# Patient Record
Sex: Male | Born: 1950 | Race: White | Hispanic: No | Marital: Married | Smoking: Never smoker
Health system: Southern US, Community
[De-identification: ages and names within clinical notes are randomized; demographics above are authoritative.]

## PROBLEM LIST (undated history)

## (undated) DIAGNOSIS — R972 Elevated prostate specific antigen [PSA]: Secondary | ICD-10-CM

## (undated) DIAGNOSIS — R7989 Other specified abnormal findings of blood chemistry: Secondary | ICD-10-CM

## (undated) DIAGNOSIS — F329 Major depressive disorder, single episode, unspecified: Secondary | ICD-10-CM

## (undated) DIAGNOSIS — K831 Obstruction of bile duct: Secondary | ICD-10-CM

## (undated) DIAGNOSIS — K589 Irritable bowel syndrome without diarrhea: Secondary | ICD-10-CM

## (undated) DIAGNOSIS — K859 Acute pancreatitis without necrosis or infection, unspecified: Secondary | ICD-10-CM

## (undated) DIAGNOSIS — R945 Abnormal results of liver function studies: Secondary | ICD-10-CM

## (undated) DIAGNOSIS — F32A Depression, unspecified: Secondary | ICD-10-CM

## (undated) DIAGNOSIS — E786 Lipoprotein deficiency: Secondary | ICD-10-CM

## (undated) DIAGNOSIS — I340 Nonrheumatic mitral (valve) insufficiency: Secondary | ICD-10-CM

## (undated) DIAGNOSIS — F419 Anxiety disorder, unspecified: Secondary | ICD-10-CM

## (undated) DIAGNOSIS — K281 Acute gastrojejunal ulcer with perforation: Secondary | ICD-10-CM

## (undated) DIAGNOSIS — I480 Paroxysmal atrial fibrillation: Secondary | ICD-10-CM

## (undated) DIAGNOSIS — G4730 Sleep apnea, unspecified: Secondary | ICD-10-CM

## (undated) DIAGNOSIS — K2900 Acute gastritis without bleeding: Secondary | ICD-10-CM

## (undated) DIAGNOSIS — K8020 Calculus of gallbladder without cholecystitis without obstruction: Secondary | ICD-10-CM

## (undated) HISTORY — DX: Irritable bowel syndrome, unspecified: K58.9

## (undated) HISTORY — DX: Other specified abnormal findings of blood chemistry: R79.89

## (undated) HISTORY — DX: Calculus of gallbladder without cholecystitis without obstruction: K80.20

## (undated) HISTORY — DX: Sleep apnea, unspecified: G47.30

## (undated) HISTORY — DX: Acute pancreatitis without necrosis or infection, unspecified: K85.90

## (undated) HISTORY — DX: Acute gastrojejunal ulcer with perforation: K28.1

## (undated) HISTORY — DX: Obstruction of bile duct: K83.1

## (undated) HISTORY — DX: Acute gastritis without bleeding: K29.00

## (undated) HISTORY — DX: Anxiety disorder, unspecified: F41.9

## (undated) HISTORY — DX: Major depressive disorder, single episode, unspecified: F32.9

## (undated) HISTORY — DX: Paroxysmal atrial fibrillation: I48.0

## (undated) HISTORY — DX: Elevated prostate specific antigen (PSA): R97.20

## (undated) HISTORY — DX: Nonrheumatic mitral (valve) insufficiency: I34.0

## (undated) HISTORY — DX: Lipoprotein deficiency: E78.6

## (undated) HISTORY — DX: Abnormal results of liver function studies: R94.5

## (undated) HISTORY — DX: Depression, unspecified: F32.A

---

## 1997-12-08 ENCOUNTER — Emergency Department (HOSPITAL_COMMUNITY): Admission: RE | Admit: 1997-12-08 | Discharge: 1997-12-08 | Payer: Self-pay

## 1998-10-02 HISTORY — PX: WHIPPLE PROCEDURE: SHX2667

## 1999-03-24 ENCOUNTER — Encounter: Payer: Self-pay | Admitting: Pulmonary Disease

## 1999-03-24 ENCOUNTER — Ambulatory Visit (HOSPITAL_COMMUNITY): Admission: RE | Admit: 1999-03-24 | Discharge: 1999-03-24 | Payer: Self-pay | Admitting: Pulmonary Disease

## 1999-04-04 ENCOUNTER — Encounter: Payer: Self-pay | Admitting: Pulmonary Disease

## 1999-04-04 ENCOUNTER — Ambulatory Visit (HOSPITAL_COMMUNITY): Admission: RE | Admit: 1999-04-04 | Discharge: 1999-04-04 | Payer: Self-pay | Admitting: Pulmonary Disease

## 1999-05-15 ENCOUNTER — Emergency Department (HOSPITAL_COMMUNITY): Admission: EM | Admit: 1999-05-15 | Discharge: 1999-05-16 | Payer: Self-pay | Admitting: Emergency Medicine

## 1999-05-17 ENCOUNTER — Encounter: Payer: Self-pay | Admitting: Pulmonary Disease

## 1999-05-17 ENCOUNTER — Inpatient Hospital Stay (HOSPITAL_COMMUNITY): Admission: AD | Admit: 1999-05-17 | Discharge: 1999-05-20 | Payer: Self-pay | Admitting: Pulmonary Disease

## 1999-05-18 ENCOUNTER — Encounter: Payer: Self-pay | Admitting: Pulmonary Disease

## 1999-05-20 ENCOUNTER — Encounter: Payer: Self-pay | Admitting: General Surgery

## 1999-11-27 ENCOUNTER — Emergency Department (HOSPITAL_COMMUNITY): Admission: EM | Admit: 1999-11-27 | Discharge: 1999-11-27 | Payer: Self-pay | Admitting: Emergency Medicine

## 1999-11-28 ENCOUNTER — Encounter: Payer: Self-pay | Admitting: Orthopedic Surgery

## 1999-11-28 ENCOUNTER — Encounter: Admission: RE | Admit: 1999-11-28 | Discharge: 1999-11-28 | Payer: Self-pay | Admitting: Orthopedic Surgery

## 1999-11-28 ENCOUNTER — Encounter: Payer: Self-pay | Admitting: Emergency Medicine

## 1999-12-08 ENCOUNTER — Encounter: Payer: Self-pay | Admitting: Orthopedic Surgery

## 1999-12-08 ENCOUNTER — Encounter: Admission: RE | Admit: 1999-12-08 | Discharge: 1999-12-08 | Payer: Self-pay | Admitting: Orthopedic Surgery

## 1999-12-26 ENCOUNTER — Encounter: Admission: RE | Admit: 1999-12-26 | Discharge: 1999-12-26 | Payer: Self-pay | Admitting: Orthopedic Surgery

## 1999-12-26 ENCOUNTER — Encounter: Payer: Self-pay | Admitting: Orthopedic Surgery

## 2000-08-06 ENCOUNTER — Encounter (INDEPENDENT_AMBULATORY_CARE_PROVIDER_SITE_OTHER): Payer: Self-pay | Admitting: Specialist

## 2000-08-06 ENCOUNTER — Ambulatory Visit (HOSPITAL_COMMUNITY): Admission: RE | Admit: 2000-08-06 | Discharge: 2000-08-06 | Payer: Self-pay | Admitting: Gastroenterology

## 2000-09-14 ENCOUNTER — Encounter: Payer: Self-pay | Admitting: Gastroenterology

## 2000-09-14 ENCOUNTER — Ambulatory Visit (HOSPITAL_COMMUNITY): Admission: RE | Admit: 2000-09-14 | Discharge: 2000-09-14 | Payer: Self-pay | Admitting: Gastroenterology

## 2000-11-17 ENCOUNTER — Encounter: Payer: Self-pay | Admitting: Pulmonary Disease

## 2000-11-17 ENCOUNTER — Ambulatory Visit (HOSPITAL_COMMUNITY): Admission: RE | Admit: 2000-11-17 | Discharge: 2000-11-17 | Payer: Self-pay | Admitting: Pulmonary Disease

## 2001-06-12 ENCOUNTER — Encounter: Admission: RE | Admit: 2001-06-12 | Discharge: 2001-06-12 | Payer: Self-pay | Admitting: Gastroenterology

## 2001-06-12 ENCOUNTER — Encounter: Payer: Self-pay | Admitting: Gastroenterology

## 2001-06-13 ENCOUNTER — Encounter: Payer: Self-pay | Admitting: Emergency Medicine

## 2001-06-13 ENCOUNTER — Emergency Department (HOSPITAL_COMMUNITY): Admission: EM | Admit: 2001-06-13 | Discharge: 2001-06-13 | Payer: Self-pay | Admitting: Emergency Medicine

## 2002-02-15 ENCOUNTER — Emergency Department: Admission: EM | Admit: 2002-02-15 | Discharge: 2002-02-15 | Payer: Self-pay | Admitting: Emergency Medicine

## 2002-05-15 ENCOUNTER — Emergency Department (HOSPITAL_COMMUNITY): Admission: EM | Admit: 2002-05-15 | Discharge: 2002-05-15 | Payer: Self-pay | Admitting: *Deleted

## 2002-05-15 ENCOUNTER — Encounter: Payer: Self-pay | Admitting: *Deleted

## 2003-08-03 HISTORY — PX: OTHER SURGICAL HISTORY: SHX169

## 2003-09-10 ENCOUNTER — Encounter: Admission: RE | Admit: 2003-09-10 | Discharge: 2003-09-10 | Payer: Self-pay | Admitting: Gastroenterology

## 2003-09-22 ENCOUNTER — Ambulatory Visit (HOSPITAL_COMMUNITY): Admission: RE | Admit: 2003-09-22 | Discharge: 2003-09-22 | Payer: Self-pay | Admitting: Gastroenterology

## 2003-09-22 ENCOUNTER — Encounter (INDEPENDENT_AMBULATORY_CARE_PROVIDER_SITE_OTHER): Payer: Self-pay | Admitting: *Deleted

## 2004-02-19 ENCOUNTER — Emergency Department (HOSPITAL_COMMUNITY): Admission: EM | Admit: 2004-02-19 | Discharge: 2004-02-19 | Payer: Self-pay | Admitting: Emergency Medicine

## 2004-02-20 ENCOUNTER — Emergency Department (HOSPITAL_COMMUNITY): Admission: EM | Admit: 2004-02-20 | Discharge: 2004-02-20 | Payer: Self-pay | Admitting: Emergency Medicine

## 2004-04-01 HISTORY — PX: OTHER SURGICAL HISTORY: SHX169

## 2004-07-22 ENCOUNTER — Encounter: Admission: RE | Admit: 2004-07-22 | Discharge: 2004-09-16 | Payer: Self-pay | Admitting: Pulmonary Disease

## 2005-01-16 ENCOUNTER — Ambulatory Visit: Payer: Self-pay | Admitting: Pulmonary Disease

## 2005-01-16 LAB — CONVERTED CEMR LAB: PSA: 3.6 ng/mL

## 2006-04-16 ENCOUNTER — Ambulatory Visit: Payer: Self-pay | Admitting: Pulmonary Disease

## 2006-06-14 ENCOUNTER — Ambulatory Visit: Payer: Self-pay | Admitting: Pulmonary Disease

## 2006-06-22 ENCOUNTER — Ambulatory Visit: Payer: Self-pay | Admitting: Pulmonary Disease

## 2007-04-18 ENCOUNTER — Ambulatory Visit: Payer: Self-pay | Admitting: Pulmonary Disease

## 2007-06-05 ENCOUNTER — Inpatient Hospital Stay (HOSPITAL_COMMUNITY): Admission: EM | Admit: 2007-06-05 | Discharge: 2007-06-09 | Payer: Self-pay | Admitting: Emergency Medicine

## 2007-06-06 ENCOUNTER — Encounter (INDEPENDENT_AMBULATORY_CARE_PROVIDER_SITE_OTHER): Payer: Self-pay | Admitting: Internal Medicine

## 2007-06-07 ENCOUNTER — Encounter: Payer: Self-pay | Admitting: Pulmonary Disease

## 2007-06-07 DIAGNOSIS — K589 Irritable bowel syndrome without diarrhea: Secondary | ICD-10-CM | POA: Insufficient documentation

## 2007-06-07 DIAGNOSIS — K831 Obstruction of bile duct: Secondary | ICD-10-CM | POA: Insufficient documentation

## 2007-06-07 DIAGNOSIS — K861 Other chronic pancreatitis: Secondary | ICD-10-CM | POA: Insufficient documentation

## 2007-06-07 DIAGNOSIS — R945 Abnormal results of liver function studies: Secondary | ICD-10-CM | POA: Insufficient documentation

## 2007-06-07 DIAGNOSIS — R109 Unspecified abdominal pain: Secondary | ICD-10-CM | POA: Insufficient documentation

## 2007-06-07 DIAGNOSIS — K281 Acute gastrojejunal ulcer with perforation: Secondary | ICD-10-CM | POA: Insufficient documentation

## 2007-06-07 DIAGNOSIS — K2900 Acute gastritis without bleeding: Secondary | ICD-10-CM | POA: Insufficient documentation

## 2007-06-07 DIAGNOSIS — G4730 Sleep apnea, unspecified: Secondary | ICD-10-CM | POA: Insufficient documentation

## 2007-06-11 DIAGNOSIS — I4891 Unspecified atrial fibrillation: Secondary | ICD-10-CM | POA: Insufficient documentation

## 2008-06-03 ENCOUNTER — Encounter: Payer: Self-pay | Admitting: Pulmonary Disease

## 2008-06-17 ENCOUNTER — Encounter: Payer: Self-pay | Admitting: Pulmonary Disease

## 2008-06-22 ENCOUNTER — Other Ambulatory Visit (HOSPITAL_COMMUNITY): Admission: RE | Admit: 2008-06-22 | Discharge: 2008-06-25 | Payer: Self-pay | Admitting: Psychiatry

## 2008-07-20 ENCOUNTER — Ambulatory Visit: Payer: Self-pay | Admitting: Pulmonary Disease

## 2008-07-28 DIAGNOSIS — E786 Lipoprotein deficiency: Secondary | ICD-10-CM | POA: Insufficient documentation

## 2008-07-28 DIAGNOSIS — K5730 Diverticulosis of large intestine without perforation or abscess without bleeding: Secondary | ICD-10-CM | POA: Insufficient documentation

## 2008-07-29 ENCOUNTER — Ambulatory Visit: Payer: Self-pay | Admitting: Pulmonary Disease

## 2008-07-31 LAB — CONVERTED CEMR LAB
ALT: 59 units/L — ABNORMAL HIGH (ref 0–53)
AST: 30 units/L (ref 0–37)
Albumin: 4.4 g/dL (ref 3.5–5.2)
Alkaline Phosphatase: 86 units/L (ref 39–117)
BUN: 11 mg/dL (ref 6–23)
Basophils Absolute: 0 10*3/uL (ref 0.0–0.1)
Basophils Relative: 0.4 % (ref 0.0–3.0)
Bilirubin Urine: NEGATIVE
Bilirubin, Direct: 0.1 mg/dL (ref 0.0–0.3)
CO2: 30 meq/L (ref 19–32)
Calcium: 8.8 mg/dL (ref 8.4–10.5)
Chloride: 107 meq/L (ref 96–112)
Cholesterol: 165 mg/dL (ref 0–200)
Creatinine, Ser: 0.8 mg/dL (ref 0.4–1.5)
Eosinophils Absolute: 0.2 10*3/uL (ref 0.0–0.7)
Eosinophils Relative: 2 % (ref 0.0–5.0)
GFR calc Af Amer: 128 mL/min
GFR calc non Af Amer: 106 mL/min
Glucose, Bld: 126 mg/dL — ABNORMAL HIGH (ref 70–99)
HCT: 44.7 % (ref 39.0–52.0)
HDL: 36.6 mg/dL — ABNORMAL LOW (ref >39.0)
Hemoglobin, Urine: NEGATIVE
Hemoglobin: 15.6 g/dL (ref 13.0–17.0)
Ketones, ur: NEGATIVE mg/dL
LDL Cholesterol: 95 mg/dL (ref 0–99)
Leukocytes, UA: NEGATIVE
Lymphocytes Relative: 20 % (ref 12.0–46.0)
MCHC: 34.9 g/dL (ref 30.0–36.0)
MCV: 84.7 fL (ref 78.0–100.0)
Monocytes Absolute: 0.5 10*3/uL (ref 0.1–1.0)
Monocytes Relative: 5.3 % (ref 3.0–12.0)
Neutro Abs: 6.5 10*3/uL (ref 1.4–7.7)
Neutrophils Relative %: 72.3 % (ref 43.0–77.0)
Nitrite: NEGATIVE
PSA: 4.38 ng/mL — ABNORMAL HIGH (ref 0.10–4.00)
Platelets: 203 10*3/uL (ref 150–400)
Potassium: 4 meq/L (ref 3.5–5.1)
RBC: 5.27 M/uL (ref 4.22–5.81)
RDW: 13.4 % (ref 11.5–14.6)
Sodium: 143 meq/L (ref 135–145)
Specific Gravity, Urine: >=1.03 (ref 1.000–1.03)
TSH: 1.54 microintl units/mL (ref 0.35–5.50)
Total Bilirubin: 0.7 mg/dL (ref 0.3–1.2)
Total CHOL/HDL Ratio: 4.5
Total Protein, Urine: NEGATIVE mg/dL
Total Protein: 7.4 g/dL (ref 6.0–8.3)
Triglycerides: 166 mg/dL — ABNORMAL HIGH (ref 0–149)
Urine Glucose: NEGATIVE mg/dL
Urobilinogen, UA: 0.2 (ref 0.0–1.0)
VLDL: 33 mg/dL (ref 0–40)
WBC: 9 10*3/uL (ref 4.5–10.5)
pH: 5.5 (ref 5.0–8.0)

## 2008-08-03 ENCOUNTER — Telehealth (INDEPENDENT_AMBULATORY_CARE_PROVIDER_SITE_OTHER): Payer: Self-pay | Admitting: *Deleted

## 2008-08-20 ENCOUNTER — Encounter: Payer: Self-pay | Admitting: Pulmonary Disease

## 2008-09-08 ENCOUNTER — Ambulatory Visit: Payer: Self-pay | Admitting: Pulmonary Disease

## 2008-09-17 ENCOUNTER — Encounter: Payer: Self-pay | Admitting: Pulmonary Disease

## 2008-11-25 ENCOUNTER — Telehealth: Payer: Self-pay | Admitting: Pulmonary Disease

## 2008-11-26 ENCOUNTER — Encounter: Payer: Self-pay | Admitting: Adult Health

## 2008-11-26 ENCOUNTER — Ambulatory Visit: Payer: Self-pay | Admitting: Internal Medicine

## 2008-11-27 LAB — CONVERTED CEMR LAB
ALT: 21 units/L (ref 0–53)
AST: 19 units/L (ref 0–37)
Albumin: 4.1 g/dL (ref 3.5–5.2)
Alkaline Phosphatase: 50 units/L (ref 39–117)
Amylase: 67 units/L (ref 27–131)
BUN: 9 mg/dL (ref 6–23)
Basophils Absolute: 0 10*3/uL (ref 0.0–0.1)
Basophils Relative: 0.3 % (ref 0.0–3.0)
Bilirubin Urine: NEGATIVE
Bilirubin, Direct: 0.1 mg/dL (ref 0.0–0.3)
CO2: 30 meq/L (ref 19–32)
Calcium: 9.2 mg/dL (ref 8.4–10.5)
Chloride: 106 meq/L (ref 96–112)
Creatinine, Ser: 0.8 mg/dL (ref 0.4–1.5)
Crystals: NEGATIVE
Eosinophils Absolute: 0.1 10*3/uL (ref 0.0–0.7)
Eosinophils Relative: 1.6 % (ref 0.0–5.0)
GFR calc Af Amer: 128 mL/min
GFR calc non Af Amer: 106 mL/min
Glucose, Bld: 169 mg/dL — ABNORMAL HIGH (ref 70–99)
HCT: 42.3 % (ref 39.0–52.0)
Hemoglobin, Urine: NEGATIVE
Hemoglobin: 14.8 g/dL (ref 13.0–17.0)
Ketones, ur: NEGATIVE mg/dL
Leukocytes, UA: NEGATIVE
Lipase: 13 units/L (ref 11.0–59.0)
Lymphocytes Relative: 22.1 % (ref 12.0–46.0)
MCHC: 34.9 g/dL (ref 30.0–36.0)
MCV: 86.8 fL (ref 78.0–100.0)
Monocytes Absolute: 0.4 10*3/uL (ref 0.1–1.0)
Monocytes Relative: 5.2 % (ref 3.0–12.0)
Neutro Abs: 5.9 10*3/uL (ref 1.4–7.7)
Neutrophils Relative %: 70.8 % (ref 43.0–77.0)
Nitrite: NEGATIVE
Platelets: 218 10*3/uL (ref 150–400)
Potassium: 4.4 meq/L (ref 3.5–5.1)
RBC: 4.87 M/uL (ref 4.22–5.81)
RDW: 12.6 % (ref 11.5–14.6)
Sodium: 143 meq/L (ref 135–145)
Specific Gravity, Urine: >=1.03 (ref 1.000–1.035)
Total Bilirubin: 0.6 mg/dL (ref 0.3–1.2)
Total Protein, Urine: NEGATIVE mg/dL
Total Protein: 6.6 g/dL (ref 6.0–8.3)
Urine Glucose: NEGATIVE mg/dL
Urobilinogen, UA: 0.2 (ref 0.0–1.0)
WBC: 8.2 10*3/uL (ref 4.5–10.5)
pH: 5.5 (ref 5.0–8.0)

## 2009-02-10 ENCOUNTER — Telehealth (INDEPENDENT_AMBULATORY_CARE_PROVIDER_SITE_OTHER): Payer: Self-pay | Admitting: *Deleted

## 2009-02-11 ENCOUNTER — Ambulatory Visit: Payer: Self-pay | Admitting: Pulmonary Disease

## 2009-02-18 ENCOUNTER — Ambulatory Visit: Payer: Self-pay | Admitting: Pulmonary Disease

## 2009-02-23 DIAGNOSIS — J209 Acute bronchitis, unspecified: Secondary | ICD-10-CM | POA: Insufficient documentation

## 2009-03-03 ENCOUNTER — Ambulatory Visit: Payer: Self-pay | Admitting: Pulmonary Disease

## 2009-03-03 ENCOUNTER — Encounter: Payer: Self-pay | Admitting: Adult Health

## 2009-03-03 LAB — CONVERTED CEMR LAB: Blood Glucose, Fingerstick: 164

## 2009-03-04 ENCOUNTER — Telehealth (INDEPENDENT_AMBULATORY_CARE_PROVIDER_SITE_OTHER): Payer: Self-pay | Admitting: *Deleted

## 2009-03-08 DIAGNOSIS — R5381 Other malaise: Secondary | ICD-10-CM | POA: Insufficient documentation

## 2009-03-08 DIAGNOSIS — R5383 Other fatigue: Secondary | ICD-10-CM

## 2009-03-08 LAB — CONVERTED CEMR LAB
ALT: 19 units/L (ref 0–53)
AST: 21 units/L (ref 0–37)
Albumin: 4.1 g/dL (ref 3.5–5.2)
Alkaline Phosphatase: 47 units/L (ref 39–117)
BUN: 19 mg/dL (ref 6–23)
Basophils Absolute: 0 10*3/uL (ref 0.0–0.1)
Basophils Relative: 0.3 % (ref 0.0–3.0)
Bilirubin Urine: NEGATIVE
Bilirubin, Direct: 0.1 mg/dL (ref 0.0–0.3)
CO2: 26 meq/L (ref 19–32)
Calcium: 8.5 mg/dL (ref 8.4–10.5)
Chloride: 110 meq/L (ref 96–112)
Creatinine, Ser: 0.8 mg/dL (ref 0.4–1.5)
Eosinophils Absolute: 0.2 10*3/uL (ref 0.0–0.7)
Eosinophils Relative: 2 % (ref 0.0–5.0)
Folate: >20 ng/mL
GFR calc non Af Amer: 105.3 mL/min (ref >60)
Glucose, Bld: 148 mg/dL — ABNORMAL HIGH (ref 70–99)
HCT: 43.4 % (ref 39.0–52.0)
Hemoglobin, Urine: NEGATIVE
Hemoglobin: 15 g/dL (ref 13.0–17.0)
Hgb A1c MFr Bld: 8 % — ABNORMAL HIGH (ref 4.6–6.5)
Ketones, ur: NEGATIVE mg/dL
Leukocytes, UA: NEGATIVE
Lymphocytes Relative: 24.6 % (ref 12.0–46.0)
Lymphs Abs: 2.2 10*3/uL (ref 0.7–4.0)
MCHC: 34.5 g/dL (ref 30.0–36.0)
MCV: 84.8 fL (ref 78.0–100.0)
Monocytes Absolute: 0.5 10*3/uL (ref 0.1–1.0)
Monocytes Relative: 5.7 % (ref 3.0–12.0)
Neutro Abs: 6 10*3/uL (ref 1.4–7.7)
Neutrophils Relative %: 67.4 % (ref 43.0–77.0)
Nitrite: NEGATIVE
PSA: 3.72 ng/mL (ref 0.10–4.00)
Platelets: 179 10*3/uL (ref 150.0–400.0)
Potassium: 3.9 meq/L (ref 3.5–5.1)
RBC: 5.12 M/uL (ref 4.22–5.81)
RDW: 12.9 % (ref 11.5–14.6)
Sodium: 142 meq/L (ref 135–145)
Specific Gravity, Urine: 1.025 (ref 1.000–1.030)
Total Bilirubin: 0.7 mg/dL (ref 0.3–1.2)
Total Protein, Urine: NEGATIVE mg/dL
Total Protein: 7 g/dL (ref 6.0–8.3)
Urine Glucose: 100 mg/dL
Urobilinogen, UA: 0.2 (ref 0.0–1.0)
Vitamin B-12: 358 pg/mL (ref 211–911)
WBC: 8.9 10*3/uL (ref 4.5–10.5)
pH: 5.5 (ref 5.0–8.0)

## 2009-03-09 ENCOUNTER — Ambulatory Visit: Payer: Self-pay | Admitting: Pulmonary Disease

## 2009-03-13 DIAGNOSIS — F411 Generalized anxiety disorder: Secondary | ICD-10-CM | POA: Insufficient documentation

## 2009-03-22 ENCOUNTER — Encounter: Payer: Self-pay | Admitting: Pulmonary Disease

## 2009-05-22 ENCOUNTER — Ambulatory Visit: Payer: Self-pay | Admitting: Internal Medicine

## 2009-05-22 ENCOUNTER — Inpatient Hospital Stay (HOSPITAL_COMMUNITY): Admission: EM | Admit: 2009-05-22 | Discharge: 2009-05-30 | Payer: Self-pay | Admitting: Emergency Medicine

## 2009-05-26 ENCOUNTER — Encounter (INDEPENDENT_AMBULATORY_CARE_PROVIDER_SITE_OTHER): Payer: Self-pay | Admitting: Cardiology

## 2009-06-02 ENCOUNTER — Encounter: Payer: Self-pay | Admitting: Pulmonary Disease

## 2009-06-02 ENCOUNTER — Ambulatory Visit: Payer: Self-pay | Admitting: Internal Medicine

## 2009-06-02 LAB — CONVERTED CEMR LAB: POC INR: 2.4

## 2009-06-09 ENCOUNTER — Ambulatory Visit: Payer: Self-pay | Admitting: Cardiology

## 2009-06-16 ENCOUNTER — Telehealth (INDEPENDENT_AMBULATORY_CARE_PROVIDER_SITE_OTHER): Payer: Self-pay | Admitting: *Deleted

## 2009-06-18 ENCOUNTER — Ambulatory Visit: Payer: Self-pay | Admitting: Internal Medicine

## 2009-06-18 LAB — CONVERTED CEMR LAB: POC INR: 1.5

## 2009-06-22 ENCOUNTER — Ambulatory Visit: Payer: Self-pay | Admitting: Pulmonary Disease

## 2009-06-22 DIAGNOSIS — F329 Major depressive disorder, single episode, unspecified: Secondary | ICD-10-CM | POA: Insufficient documentation

## 2009-06-22 DIAGNOSIS — K8020 Calculus of gallbladder without cholecystitis without obstruction: Secondary | ICD-10-CM | POA: Insufficient documentation

## 2009-07-02 ENCOUNTER — Ambulatory Visit: Payer: Self-pay | Admitting: Internal Medicine

## 2009-07-02 LAB — CONVERTED CEMR LAB: POC INR: 1.5

## 2009-07-07 ENCOUNTER — Ambulatory Visit: Payer: Self-pay | Admitting: Internal Medicine

## 2009-07-07 LAB — CONVERTED CEMR LAB: POC INR: 2.8

## 2009-07-26 ENCOUNTER — Ambulatory Visit: Payer: Self-pay | Admitting: Cardiovascular Disease

## 2009-07-26 LAB — CONVERTED CEMR LAB: POC INR: 3.9

## 2009-08-06 ENCOUNTER — Ambulatory Visit: Payer: Self-pay | Admitting: Cardiology

## 2009-08-06 LAB — CONVERTED CEMR LAB: POC INR: 2.5

## 2009-09-07 ENCOUNTER — Ambulatory Visit: Payer: Self-pay | Admitting: Cardiovascular Disease

## 2009-09-07 LAB — CONVERTED CEMR LAB: POC INR: 2

## 2009-09-20 ENCOUNTER — Encounter: Payer: Self-pay | Admitting: Pulmonary Disease

## 2009-10-05 ENCOUNTER — Ambulatory Visit: Payer: Self-pay | Admitting: Cardiology

## 2009-10-05 LAB — CONVERTED CEMR LAB: POC INR: 4.6

## 2009-10-25 ENCOUNTER — Ambulatory Visit: Payer: Self-pay | Admitting: Cardiology

## 2009-10-25 LAB — CONVERTED CEMR LAB: POC INR: 2.3

## 2009-11-23 ENCOUNTER — Ambulatory Visit: Payer: Self-pay | Admitting: Cardiology

## 2009-11-23 LAB — CONVERTED CEMR LAB: POC INR: 1.5

## 2009-12-23 ENCOUNTER — Telehealth (INDEPENDENT_AMBULATORY_CARE_PROVIDER_SITE_OTHER): Payer: Self-pay | Admitting: Pharmacist

## 2009-12-27 ENCOUNTER — Ambulatory Visit: Payer: Self-pay | Admitting: Cardiovascular Disease

## 2009-12-27 LAB — CONVERTED CEMR LAB: POC INR: 2.1

## 2010-01-12 IMAGING — CR DG CHEST 1V
1 series · 1 of 1 positions shown · non-contrast
Comparison: 06/08/2007

CLINICAL DATA: Physical exam.  No current complaints.

CHEST - 1 VIEW

[view not recorded]
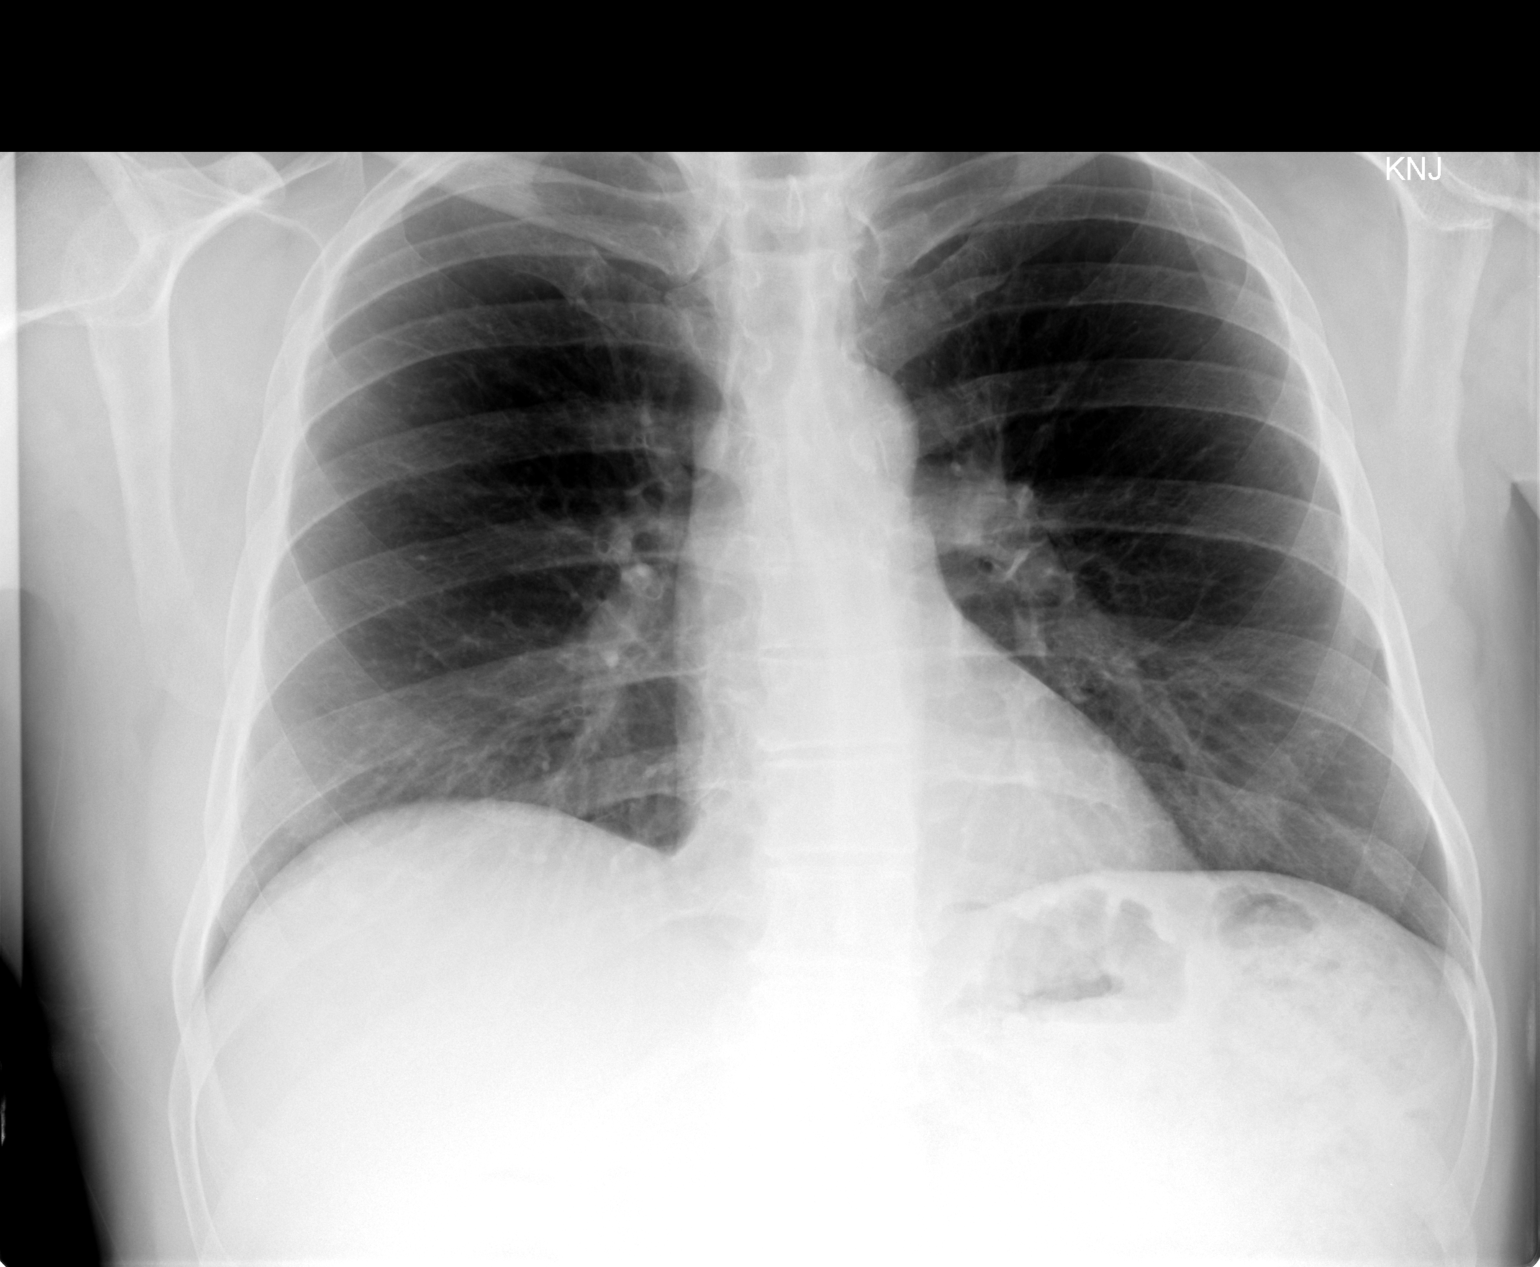

[1 of 1 positions shown; findings below may reference images not displayed]

FINDINGS: The heart size and mediastinal contours are within normal
limits.  Both lungs are clear.  The visualized skeletal structures
are unremarkable.
IMPRESSION: No active cardiopulmonary disease.

## 2010-01-17 ENCOUNTER — Ambulatory Visit: Payer: Self-pay | Admitting: Cardiology

## 2010-01-17 LAB — CONVERTED CEMR LAB: POC INR: 2.5

## 2010-02-08 ENCOUNTER — Ambulatory Visit: Payer: Self-pay | Admitting: Pulmonary Disease

## 2010-02-09 ENCOUNTER — Encounter: Payer: Self-pay | Admitting: Cardiology

## 2010-02-09 ENCOUNTER — Encounter: Payer: Self-pay | Admitting: Pulmonary Disease

## 2010-02-09 ENCOUNTER — Ambulatory Visit: Payer: Self-pay | Admitting: Cardiology

## 2010-02-09 DIAGNOSIS — I080 Rheumatic disorders of both mitral and aortic valves: Secondary | ICD-10-CM | POA: Insufficient documentation

## 2010-02-09 LAB — CONVERTED CEMR LAB
ALT: 10 units/L (ref 0–53)
AST: 13 units/L (ref 0–37)
Albumin: 4.8 g/dL (ref 3.5–5.2)
Alkaline Phosphatase: 46 units/L (ref 39–117)
BUN: 15 mg/dL (ref 6–23)
Basophils Absolute: 0 10*3/uL (ref 0.0–0.1)
Basophils Relative: 0 % (ref 0–1)
Bilirubin, Direct: 0.1 mg/dL (ref 0.0–0.3)
CO2: 25 meq/L (ref 19–32)
Calcium: 9.5 mg/dL (ref 8.4–10.5)
Chloride: 104 meq/L (ref 96–112)
Creatinine, Ser: 0.91 mg/dL (ref 0.40–1.50)
Eosinophils Absolute: 0 10*3/uL (ref 0.0–0.7)
Eosinophils Relative: 0 % (ref 0–5)
Glucose, Bld: 140 mg/dL — ABNORMAL HIGH (ref 70–99)
HCT: 43.5 % (ref 39.0–52.0)
Hemoglobin: 14.3 g/dL (ref 13.0–17.0)
Hgb A1c MFr Bld: 6.2 % — ABNORMAL HIGH (ref <5.7)
Indirect Bilirubin: 0.3 mg/dL (ref 0.0–0.9)
Lymphocytes Relative: 15 % (ref 12–46)
Lymphs Abs: 1.4 10*3/uL (ref 0.7–4.0)
MCHC: 32.9 g/dL (ref 30.0–36.0)
MCV: 84.8 fL (ref 78.0–100.0)
Monocytes Absolute: 0.5 10*3/uL (ref 0.1–1.0)
Monocytes Relative: 6 % (ref 3–12)
Neutro Abs: 7.2 10*3/uL (ref 1.7–7.7)
Neutrophils Relative %: 79 % — ABNORMAL HIGH (ref 43–77)
Platelets: 215 10*3/uL (ref 150–400)
Potassium: 4.6 meq/L (ref 3.5–5.3)
RBC: 5.13 M/uL (ref 4.22–5.81)
RDW: 14.4 % (ref 11.5–15.5)
Sodium: 142 meq/L (ref 135–145)
TSH: 0.635 microintl units/mL (ref 0.350–4.500)
Total Bilirubin: 0.4 mg/dL (ref 0.3–1.2)
Total Protein: 7.1 g/dL (ref 6.0–8.3)
WBC: 9.1 10*3/uL (ref 4.0–10.5)

## 2010-02-15 ENCOUNTER — Telehealth (INDEPENDENT_AMBULATORY_CARE_PROVIDER_SITE_OTHER): Payer: Self-pay | Admitting: *Deleted

## 2010-02-21 ENCOUNTER — Encounter: Payer: Self-pay | Admitting: Cardiology

## 2010-05-02 ENCOUNTER — Encounter: Payer: Self-pay | Admitting: Pulmonary Disease

## 2010-05-09 ENCOUNTER — Ambulatory Visit: Payer: Self-pay | Admitting: Pulmonary Disease

## 2010-05-09 DIAGNOSIS — R972 Elevated prostate specific antigen [PSA]: Secondary | ICD-10-CM | POA: Insufficient documentation

## 2010-05-31 ENCOUNTER — Telehealth (INDEPENDENT_AMBULATORY_CARE_PROVIDER_SITE_OTHER): Payer: Self-pay | Admitting: *Deleted

## 2010-05-31 LAB — CONVERTED CEMR LAB
BUN: 16 mg/dL (ref 6–23)
CO2: 31 meq/L (ref 19–32)
Calcium: 9 mg/dL (ref 8.4–10.5)
Chloride: 105 meq/L (ref 96–112)
Creatinine, Ser: 0.8 mg/dL (ref 0.4–1.5)
GFR calc non Af Amer: 107.97 mL/min (ref >60)
Glucose, Bld: 88 mg/dL (ref 70–99)
Hgb A1c MFr Bld: 6.2 % (ref 4.6–6.5)
Potassium: 4.4 meq/L (ref 3.5–5.1)
Sodium: 142 meq/L (ref 135–145)

## 2010-11-01 NOTE — Assessment & Plan Note (Signed)
Summary: Acute NP office visit - discuss meds   Primary Provider/Referring Provider:  nadel, scott  CC:  would like to discuss medications and stress at home: would like to discuss coming off coumadin and beta-blocker and not sleeping.  History of Present Illness: 60 y/o WM here with known history of OSA, GERD, chronic pancreatitis-fb GI-Buccini, recurrent ABD pain-s/p Whipple.     ~  March 09, 2009:  Obinna has had a stressful time since our last visit... he & wife separated for awhile last fall- now back together... his BS has elevated and A1c was up to 8.0 recently requiring the initiation of DM meds... recent URI & tick exposure... ** we discussed all these issues in detail today.   ~  June 22, 2009:  he was hosp 8/10 by CCM & disch by DrSimmonds- summary pending... avail records indicate adm w/ abd pain & eval revealed gallstones, elevated LFTs- seen by DrBucinni for GI & DrHoxsworth for surg- they didn't feel ERCP poss due to his prev Whipple surg & they rec ret to [facility] to see DrPappas... he improved w/ antibiotics... since disch he has seen DrPappas & needs additional surg ?reconstruct CBD? sched for 08/13/09... in addition he had PAF w/ meds adjusted & ret to NSR, but is now on Coumadin followed in the [facility]... today he notes depression & we discussed trial of SSRI- Lexapro...     Feb 08, 2010--Presents for work in visit.1.  Would like to discuss medications and stress at home: would like to discuss coming off coumadin and beta-blocker. He is considering an upcoming repair of his common bile duct , has a second opinion at [facility] clinic in few weeks.Marland KitchenHe wants to investigate if he needs to continue cardizem and coumadin. Hx of recurrent atrial fib x 3 after surgery (last in 8/10) was placed on cardizem and coumadin .He has not seen cardiology post discharge from last year. From chart records and pt was seen during hospital stay. He is managed by [facility] coumadin clinic. He denies chest  pain, palpitation, irregular heartbeats.  8/10 hosp- initial EKG= NSR, WNL;  subseq AFib w/ rvr... 2DEcho showed norm LV w/ EF= 55-60%, no wall motion abn, redundant atrial septum, mild dil LA= 49mm, mod MR...    2.  He is under alot of stress, restarted his depression/anxiety meds being followed by Psych   Recenetly seen Saul Fordyce , now back on Wellbutrin, and Lexapro. His wife and him are seperated again and looks like they are going to go through a divorce. He is very distraught about this and is having trouble sleeping. They went through something similar last year and were separated for few weeks however they were not able to reconcile their differences. He is undergoing counseling. he denies suicidal ideations.     3. DM - sugars up and down. He has lost almost 20lbs due to stress and not eating as much. Low appetite. No hypoglycemic episodes. Last A1C 6..2 (8/10) Denies chest pain, dyspnea, orthopnea, hemoptysis, fever, n/v/d, edema, headache, polydipsia, polyuria.   Medications Prior to Update: 1)  Warfarin Sodium 5 Mg Tabs (Warfarin Sodium) .... Take As Directed By Coumadin Clinic. 2)  Cardizem Cd 180 Mg Xr24h-Cap (Diltiazem Hcl Coated Beads) .... Take 1 Tablet By Mouth Once A Day 3)  Janumet 50-500 Mg Tabs (Sitagliptin-Metformin Hcl) .... Take 1 Tablet By Mouth Two Times A Day With Food 4)  Protonix 40 Mg Tbec (Pantoprazole Sodium) .... Take 1 Tablet By Mouth Once A Day  5)  Lipram 4500 20-4.5-25 Mu  Cpep (Amylase-Lipase-Protease) .... 2 Tabs Before Dinner 6)  Lexapro 10 Mg Tabs (Escitalopram Oxalate) .... Take 1 Tab By Mouth Once Daily As Directed...  Current Medications (verified): 1)  Warfarin Sodium 5 Mg Tabs (Warfarin Sodium) .... Take As Directed By Coumadin Clinic. 2)  Cardizem Cd 180 Mg Xr24h-Cap (Diltiazem Hcl Coated Beads) .... Take 1 Tablet By Mouth Once A Day 3)  Janumet 50-500 Mg Tabs (Sitagliptin-Metformin Hcl) .... Take 1 Tablet By Mouth Two Times A Day With Food 4)   Protonix 40 Mg Tbec (Pantoprazole Sodium) .... Take 1 Tablet By Mouth Once A Day 5)  Lexapro 10 Mg Tabs (Escitalopram Oxalate) .... Take 1 Tab By Mouth Once Daily As Directed... 6)  Clonazepam 0.5 Mg Tabs (Clonazepam) .... Take 1 Tab By Mouth At Bedtime 7)  Creon 24000 Unit Cpep (Pancrelipase (Lip-Prot-Amyl)) .... 2 Capsules W/ Meal 8)  Bupropion Hcl 150 Mg Xr24h-Tab (Bupropion Hcl) .... Take 1 Tablet By Mouth Once A Day  Allergies (verified): 1)  ! 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 S/P Elap w/ suture of perf jejunal ulcer & drainage of lesser sac abscess - 11/04 in [place]... S/P f[facility]u [facility] 7/05 by DrBranch w/ sludge vs stones near the junction of the CBD & cystic duct, no stones in the GB...  Family History: Last updated: 07-26-08 mother deceased age 55 from brain cancer  hx of emphysema,asthma father deceased age 593 from old age 59 sibling alive age 24  Social History: Last updated: Jul 26, 2008 non smoker exercise= walks some no caffeine use no etoh married children  Risk Factors: Smoking Status: never (07-26-08)  Past Medical History: SLEEP APNEA, MILD (ICD-780.57) - sleep study 1998 w/ RDI 14 & lowest sat 82%;  trial CPAP dc'd by pt; seen by DrClance & DrShoemaker  PAROXYSMAL ATRIAL FIBRILLATION (ICD-427.31) - Hx recurrent PAF transiently after surg x2 & again 8/10 hosp... resolved w/ CCB Rx, but now on CARDIZEM CD 180mg /d + COUMADIN (coumadin clinic)...  ~  baseline EKG w/ NSR, WNL  ~  2DEcho 12/04 w/ norm LVF, trace MR/ TR  ~  Cardiolite 12/04 neg w/ EF= 62%  ~  8/10 hosp- initial EKG= NSR, WNL;  subseq AFib w/ rvr... 2DEcho showed norm LV w/ EF= 55-60%, no wall motion abn, redundant atrial septum, mild dil LA= 49mm, mod MR...  LOW HDL (ICD-272.5) - he's  been trying to control w/ diet + exercise...  ~  FLP 7/07 showed TChol 114, TG 92, HDL 29, LDL 67...  ~  FLP 10/09 showed TChol 165, TG 166, HDL 37, LDL 95  DIABETES MELLITUS, TYPE II (ICD-250.00) - now on JANUMET 50-500 Bid... there is a family hx of diabetes, and he had CBD stricture w/ Whipple procedure 2000...   ~  labs 7/07 showed BS= 113, HgA1c= 5.7  ~  labs 10/09 showed BS= 126  ~  labs 6/10 showed BS= 148, A1c= 8.0.Marland Kitchen. rec> start JANUMET 50-500 Bid...  ~  labs 8/10 in hosp showed BS=90-130s & A1c= 6.2  ABDOMINAL PAIN, RECURRENT (ICD-789.00) - long complex hx starting in 2000 w/ abd pain, benign CBD stricture w/ Whipple procedure at [facility] in 2000;  subseq perf jejunal anastomotic ulcer w/ surg in [place] in 2005;  chr pancreatitis w/ recurrent abd pain & elevated  LFT's (hosp in 2008 by GI);  gallstones w/ neg cholangitis work up - all followed by DrBuccini.  ~  Hosp 8/10 w/ abd pain, nausea, elevated LFTs... gallstones, ?cholecystitis- Rx antibiotics & set back to [facility]... DrPappas plans surg in Nov.  GASTRITIS, ACUTE (ICD-535.00) - on PROTONIX 40mg /d...last EGD 2/06 by DrBuccini showed hemorrhagic gastritis... Hx of ULCER, ACUTE GASTROJEJUNAL W/PERF W/O OBST (ICD-534.10) - EGD in 2004 showed a perforated jejunal ulcer   Review of Systems      See HPI  Vital Signs:  Patient profile:   60 year old male Height:      69.5 inches Weight:      171 pounds BMI:     24.98 O2 Sat:      98 % on Room air Temp:     98.8 degrees F oral Pulse rate:   88 / minute BP sitting:   104 / 64  (left arm) Cuff size:   regular  Vitals Entered By: Boone Master CNA (Feb 08, 2010 9:18 AM)  O2 Flow:  Room air CC: would like to discuss medications and stress at home: would like to discuss coming off coumadin and beta-blocker, not sleeping Is Patient Diabetic? No Comments Medications reviewed with patient Daytime contact number verified with patient. Boone Master CNA  Feb 08, 2010 9:19 AM    Physical  Exam  Additional Exam:  WD, WN, 60 y/o WM in NAD... GENERAL:  Alert & oriented;cooperative... HEENT:  [place]/AT,  , EACs-clear, TMs-wnl, NOSE-pale, clear discharge. , THROAT-clear & wnl. NECK:  Supple w/ fairROM; no JVD; normal carotid impulses w/o bruits; no thyromegaly or nodules palpated; no lymphadenopathy. CHEST:Coarse BS w/ no wheeizng.  HEART:  Regular Rhythm; without murmurs/ rubs/ or gallops detected... ABDOMEN:  Soft & nontender; mult surg scars; normal bowel sounds; no organomegaly or masses palpated  EXT: without deformities, mild arthritic changes; no varicose veins/ venous insuffic/ or edema. DERM: no rash.  Psych: calm.      Impression & Recommendations:  Problem # 1:  DEPRESSION (ICD-311)  Continue follow up with counseling and pschy.  take med regularly  Orders: Est. Patient Level IV (96045)  Problem # 2:  PAROXYSMAL ATRIAL FIBRILLATION (ICD-427.31) recurrent atrial fib s/p surg  now on cardizem and coumadin will refer to cards to determine if coumaidn needs to remain.  His updated medication list for this problem includes:    Warfarin Sodium 5 Mg Tabs (Warfarin sodium) .Marland Kitchen... Take as directed by coumadin clinic.  Orders: Cardiology Referral (Cardiology) Est. Patient Level IV (40981)  Problem # 3:  DIABETES MELLITUS, TYPE II (ICD-250.00) controlled on meds.  labs pending.  His updated medication list for this problem includes:    Janumet 50-500 Mg Tabs (Sitagliptin-metformin hcl) .Marland Kitchen... Take 1 tablet by mouth two times a day with food  Orders: TLB-BMP (Basic Metabolic Panel-BMET) (80048-METABOL) TLB-CBC Platelet - w/Differential (85025-CBCD) TLB-A1C / Hgb A1C (Glycohemoglobin) (83036-A1C) Est. 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   Orders: Est. Patient Level IV (82956)  Medications Added to Medication List This Visit: 1)  Clonazepam 0.5 Mg Tabs (Clonazepam)  .... Take 1 tab by mouth at bedtime 2)  Creon 24000 Unit Cpep (Pancrelipase (lip-prot-amyl)) .... 2 capsules w/ meal 3)  Bupropion Hcl 150 Mg Xr24h-tab (Bupropion hcl) .... Take 1 tablet by mouth once a day  Complete Medication List: 1)  Warfarin Sodium 5 Mg Tabs (Warfarin  sodium) .... Take as directed by coumadin clinic. 2)  Cardizem Cd 180 Mg Xr24h-cap (Diltiazem hcl coated beads) .... Take 1 tablet by mouth once a day 3)  Janumet 50-500 Mg Tabs (Sitagliptin-metformin hcl) .... Take 1 tablet by mouth two times a day with food 4)  Protonix 40 Mg Tbec (Pantoprazole sodium) .... Take 1 tablet by mouth once a day 5)  Lexapro 10 Mg Tabs (Escitalopram oxalate) .... Take 1 tab by mouth once daily as directed... 6)  Clonazepam 0.5 Mg Tabs (Clonazepam) .... Take 1 tab by mouth at bedtime 7)  Creon 24000 Unit Cpep (Pancrelipase (lip-prot-amyl)) .... 2 capsules w/ meal 8)  Bupropion Hcl 150 Mg Xr24h-tab (Bupropion hcl) .... Take 1 tablet by mouth once a day  Other Orders: TLB-TSH (Thyroid Stimulating Hormone) (84443-TSH) TLB-Hepatic/Liver Function Pnl (80076-HEPATIC)  Patient Instructions: 1)  Continue on same meds.  2)  Continue with counseling as recommended.  3)  I will call with lab results.  4)  We are setting you up for follow up with cardiology to discuss your atrial fibrillation.  5)  follow up Dr. Kriste Basque in 3 months and as needed

## 2010-11-01 NOTE — Assessment & Plan Note (Signed)
Summary: follow up///JJ   Primary Care Provider:  Anani Gu  CC:  11 month ROV & review of mult medical problems....  History of Present Illness: 60 y/o WM here for a follow up visit... he has multiple medical problems as noted below... he is also followed by DrBuccini for GI...   ~  March 09, 2009:  Keontae has had a stressful time since our last visit... he & wife separated for awhile last fall- now back together... his BS has elevated and A1c was up to 8.0 recently requiring the initiation of DM meds... recent URI & tick exposure... ** we discussed all these issues in detail today.   ~  June 22, 2009:  he was hosp 8/10 by CCM & disch by DrSimmonds- summary pending... avail records indicate adm w/ abd pain & eval revealed gallstones, elevated LFTs- seen by DrBucinni for GI & DrHoxsworth for CCS- they didn't feel ERCP poss due to his prev Whipple surg & they rec ret to [facility] to see DrPappas... he improved w/ antibiotics... since disch he has seen DrPappas & ?needs additional surg ?reconstruct CBD? sched for 08/13/09... in addition he had PAF w/ meds adjusted & ret to NSR, but is now on Coumadin followed in the [facility]madinClinic... today he notes depression & we discussed trial of SSRI- Lexapro...   ~  May 09, 2010:  Lavan decided against surg & had 2nd opinion at [facility] in [place] & they advised against surg at this time... no flair in his GI/ biliary prob over the last yr... increased stress as he & wife are now separated again ?planning divorce... he has lost 21# down to 177# now... he was taking Lexapro but decided to stop 2 weeks ago & feeling OK so far... followed by DrOttelin for Urology w/ elev PSA- neg bx 12/09 & he continues to follow every 40mo... saw DrCrenshaw for Cards 5/11- Hx PAF, maintaining NSR, OK to stop coumadin & switched to ASA 81mg /d...     Current Problem List:  SLEEP APNEA, MILD (ICD-780.57) - sleep study 1998 w/ RDI 14 & lowest sat 82%;  trial CPAP dc'd by pt; seen by  DrClance & DrShoemaker;  sx improved w/ wt loss & currently denies sleep disordered breathing, no snoring, no daytime symptoms, etc...  PAROXYSMAL ATRIAL FIBRILLATION (ICD-427.31) - Hx recurrent PAF transiently after surg x2 & again 8/10 hosp... resolved w/ CCB Rx, now on CARDIZEM CD 180mg /d + Coumadin stopped 5/11 by DrCrenshaw in favor of ASA81mg /d.  ~  baseline EKG w/ NSR, WNL  ~  2DEcho 12/04 w/ norm LVF, trace MR/ TR  ~  Cardiolite 12/04 neg w/ EF= 62%  ~  8/10 hosp- initial EKG= NSR, WNL;  subseq AFib w/ rvr... 2DEcho showed norm LV w/ EF= 55-60%, no wall motion abn, redundant atrial septum, mild dil LA= 49mm, mod MR...  ~  5/11:  f/u DrCrenshaw & he stopped the Coumadin w/ switch to ASA 81mg /d.  LOW HDL (ICD-272.5) - he's been trying to control w/ diet + exercise...  ~  FLP 7/07 showed TChol 114, TG 92, HDL 29, LDL 67...  ~  FLP 10/09 showed TChol 165, TG 166, HDL 37, LDL 95  ~  we discussed checking FASTING labs on return visit...  DIABETES MELLITUS, TYPE II (ICD-250.00) - now on JANUMET 50-500 Bid... there is a family hx of diabetes, and he had CBD stricture w/ Whipple procedure 2000...   ~  labs 7/07 showed BS= 113, HgA1c= 5.7  ~  labs 10/09 showed BS= 126  ~  labs 6/10 showed BS= 148, A1c= 8.0.Marland Kitchen. rec> start JANUMET 50-500 Bid...  ~  labs 8/10 in hosp showed BS=90-130s & A1c= 6.2  ~  labs 5/11 showed BS= 140, A1c= 6.2  ~  labs 18/11 showed BS=   ABDOMINAL PAIN, RECURRENT (ICD-789.00) - long complex hx starting in 2000 w/ abd pain, benign CBD stricture w/ Whipple procedure at [facility] in 2000;  subseq perf jejunal anastomotic ulcer w/ surg in [place] in 2005;  chr pancreatitis w/ recurrent abd pain & elevated LFT's (hosp in 2008 by GI);  gallstones w/ neg cholangitis work up - all followed by DrBuccini.  ~  Hosp 8/10 w/ abd pain, nausea, elevated LFTs... gallstones, ?cholecystitis- Rx antibiotics & sent back to [facility]... DrPappas planned surg to reconstruct CBD but pt was reluctant... had  2nd opin at [facility] in [place] & they advised against surg at thistime...  ~  8/11:  biliary & GI problems have been quiet for the past yr, LFT's normal 5/11...  GASTRITIS, ACUTE (ICD-535.00) - on PROTONIX 40mg /d...last EGD 2/06 by DrBuccini showed hemorrhagic gastritis... Hx of ULCER, ACUTE GASTROJEJUNAL W/PERF W/O OBST (ICD-534.10) - EGD in 2004 showed a perforated jejunal ulcer that was oversewn, and a patent gastrojejunostomy... 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 PANCREATITIS, CHRONIC (ICD-577.1) - on LIPRAM 2 tabs Bid... DIVERTICULOSIS, ASYMPTOMATIC (ICD-562.10) - colonoscopies 11/01 & 2/06 by DrBuccini w/o other abnormalities seen... IRRITABLE BOWEL SYNDROME (ICD-564.1)  Hx of LIVER FUNCTION TESTS, ABNORMAL (ICD-794.8) -   ~  labs 9/07 showed AlkPhos 84, SGOT 43, SGPT 75  ~  labs 10/09 showed AlkPhos 86, SGOT 30, SGPT 59  ~  labs 6/10 showed AlkPhos 47, SGOT 21, SGPT 19  ~  labs 8/10 hosp admit showed AlkPhos 189, SGOT 885, SGPT 605 & improved over 5d- AlkPhos 109 SGOT 40, SGPT 185.  ~  labs 5/11 showed normal LFTs- AlkPhos 46, SGOT 13, SGPT 10  ELEVATED PROSTATE SPECIFIC ANTIGEN (ICD-790.93) - followed by DrOttelin for Urology w/ elev PSA- neg bx 12/09 & he continues to follow every 30mo...   ANXIETY (ICD-300.00)& DEPRESSION - mult stressors and he's been feeling down since the 8/10 hosp... now separated & going through divorce...we discussed Rx w/ LEXAPRO 10mg /d & he stopped on his own 7/11...   Preventive Screening-Counseling & Management  Alcohol-Tobacco     Smoking Status: never  Allergies: 1)  ! Penicillin V Potassium (Penicillin V Potassium)  Comments:  Nurse/Medical Assistant: The patient's medications and allergies were reviewed with the patient and were updated in the Medication and Allergy Lists.  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 S/P Elap w/ suture of perf jejunal ulcer & drainage of lesser sac abscess - 11/04 in [place]... S/P f/u ERCP at [facility] 7/05 by DrBranch w/ sludge vs stones near the junction of the CBD & cystic duct, no stones in the  GB...  Family History: Reviewed history from 02/09/2010 and no changes required. mother deceased age 35 from brain cancer  hx of emphysema, asthma father deceased age 62 from old age; previous CAD began in his 62's 1 sibling alive age 29  Social History: Reviewed history from 07/20/2008 and no changes required. non smoker exercise= walks some no caffeine use no etoh married children  Review of Systems      See HPI       The patient complains of weight loss and dyspnea on exertion.  The patient denies anorexia, fever, weight gain, vision loss, decreased hearing, hoarseness, chest pain, syncope, peripheral edema, prolonged cough, headaches, hemoptysis, abdominal pain, melena, hematochezia, severe indigestion/heartburn, hematuria, incontinence, muscle weakness, suspicious skin lesions, transient blindness, difficulty walking, depression, unusual weight change, abnormal bleeding, enlarged lymph nodes, and angioedema.    Vital Signs:  Patient profile:   60 year old male Height:       69.5 inches Weight:      177 pounds O2 Sat:      99 % on Room air Temp:     98.5 degrees F oral Pulse rate:   82 / minute BP sitting:   120 / 60  (left arm) Cuff size:   regular  Vitals Entered By: Randell Loop CMA (May 09, 2010 2:54 PM)  O2 Sat at Rest %:  99 O2 Flow:  Room air CC: 11 month ROV & review of mult medical problems... Is Patient Diabetic? Yes Pain Assessment Patient in pain? no      Comments meds updated today with pt   Physical Exam  Additional Exam:  WD, WN, 60 y/o WM in NAD.Marland Kitchen.  sl depressed mood is evident... GENERAL:  Alert & oriented; pleasant & cooperative... HEENT:  [place]/AT, PERRLA, EOM- full, EACs-clear, TMs-wnl, NOSE- wnl, THROAT-clear & wnl. NECK:  Supple w/ fairROM; no JVD; normal carotid impulses w/o bruits; no thyromegaly or nodules palpated; no lymphadenopathy. CHEST:  Clear to P & A, no wheezing/ rales/ rhonchi... HEART:  regular rhythm, gr 1-2 MR, no rubs or gallops detected... ABDOMEN:  Soft & nontender; surg scar; normal bowel sounds; no organomegaly or masses palpated  EXT: without deformities, mild arthritic changes; no varicose veins/ venous insuffic/ or edema. DERM:  he has mild vitiligo, no lesions seen...    Impression & Recommendations:  Problem # 1:  PAROXYSMAL ATRIAL FIBRILLATION (ICD-427.31) Holding NSR & DrCrenshaw stopped his Coumadin in favor of ASA daily... His updated medication list for this problem includes:    Aspirin 81 Mg Tabs (Aspirin) .Marland Kitchen... Take 1 tablet by mouth once a day    Cardizem Cd 180 Mg Xr24h-cap (Diltiazem hcl coated beads) .Marland Kitchen... Take 1 tablet by mouth once a day  Problem # 2:  LOW HDL (ICD-272.5) He needs f/u FLP & we discussed checking this on return OV- fasting...  Problem # 3:  DIABETES MELLITUS, TYPE II (ICD-250.00) He notes BS up & down w/ all the stress> his A1c has remained satis on his diet (weight down 21#) & Janumet... His updated medication list for this problem includes:    Aspirin 81 Mg  Tabs (Aspirin) .Marland Kitchen... Take 1 tablet by mouth once a day    Janumet 50-500 Mg Tabs (Sitagliptin-metformin hcl) .Marland Kitchen... Take 1 tablet by mouth two times a day with food  Orders: TLB-BMP (Basic Metabolic Panel-BMET) (80048-METABOL) TLB-A1C / Hgb A1C (Glycohemoglobin) (83036-A1C)  Problem # 4:  ABDOMINAL PAIN, RECURRENT (ICD-789.00) No recent episodes>  he had 2nd opinion at  [facility] & has decided against further surg at this time...  Problem # 5:  Hx of LIVER FUNCTION TESTS, ABNORMAL (ICD-794.8) His LFT's have remained WNL.Marland KitchenMarland Kitchen  Problem # 6:  ANXIETY & DEPRESSION>>> He's been under considerable stress> on Wellbutrin & off the Lexapro at present...  Problem # 7:  OTHER MEDICAL PROBLEMS AS NOTED>>>  Complete Medication List: 1)  Aspirin 81 Mg Tabs (Aspirin) .... Take 1 tablet by mouth once a day 2)  Cardizem Cd 180 Mg Xr24h-cap (Diltiazem hcl coated beads) .... Take 1 tablet by mouth once a day 3)  Janumet 50-500 Mg Tabs (Sitagliptin-metformin hcl) .... Take 1 tablet by mouth two times a day with food 4)  Protonix 40 Mg Tbec (Pantoprazole sodium) .... Take 1 tablet by mouth once a day 5)  Creon 24000 Unit Cpep (Pancrelipase (lip-prot-amyl)) .... 2 capsules w/ meal 6)  Clonazepam 0.5 Mg Tabs (Clonazepam) .... Take 1 tab by mouth at bedtime 7)  Bupropion Hcl 150 Mg Xr24h-tab (Bupropion hcl) .... Take 1 tablet by mouth once a day  Patient Instructions: 1)  Today we updated your med list- see below.... 2)  Continue your current meds the same... 3)  Today we did your follow up blood work... please call the "phone tree" in a few days for your lab results.Marland KitchenMarland Kitchen 4)  Call for any questions or if I cvan help in any way... 5)  Please schedule a follow-up appointment in 6 months, with FASTING blood work at that time.Marland KitchenMarland Kitchen

## 2010-11-01 NOTE — Medication Information (Signed)
Summary: rov/ewj  Anticoagulant Therapy  Managed by: Bethena Midget, RN, BSN Referring MD: Alroy Dust MD PCP: Alroy Dust Supervising MD: Juanda Chance MD, Clarisa Danser Indication 1: Atrial Fibrillation Lab Used: [facility] Site: [facility] INR POC 4.6 INR RANGE 2.0-3.0  Dietary changes: no    Health status changes: no    Bleeding/hemorrhagic complications: no    Recent/future hospitalizations: no    Any changes in medication regimen? no    Recent/future dental: no  Any missed doses?: no       Is patient compliant with meds? yes      Comments: Pt. will be out of town until 24th, Dr. Jimmey Ralph awar, agrees with dose.   Allergies: 1)  ! Penicillin V Potassium (Penicillin V Potassium)  Anticoagulation Management History:      The patient is taking warfarin and comes in today for a routine follow up visit.  Positive risk factors for bleeding include presence of serious comorbidities.  Negative risk factors for bleeding include an age less than 31 years old.  The bleeding index is 'intermediate risk'.  Positive CHADS2 values include History of Diabetes.  Negative CHADS2 values include Age > 89 years old.  Anticoagulation responsible provider: Juanda Chance MD, Smitty Cords.  INR POC: 4.6.  Cuvette Lot#: [id number].  Exp: 11/2010.    Anticoagulation Management Assessment/Plan:      The patient's current anticoagulation dose is Warfarin sodium 5 mg tabs: Take as directed by coumadin clinic..  The target INR is 2.0-3.0.  The next INR is due 10/25/2009.  Anticoagulation instructions were given to patient.  Results were reviewed/authorized by Bethena Midget, RN, BSN.  He was notified by Bethena Midget, RN, BSN.         Prior Anticoagulation Instructions: INR 2.0  Continue on same dosage 1 tablet daily except 1.5 tablets on Mondays and Fridays.   Recheck in 4 weeks.    Current Anticoagulation Instructions: INR 4.6 Skip today's dose then change dose to 5mg s daily except 7.5mg s on  Mondays. Recheck in 10-14 days.

## 2010-11-01 NOTE — Medication Information (Signed)
Summary: ccr/kfw  Anticoagulant Therapy  Managed by: Cloyde Reams, RN, BSN Referring MD: Alroy Dust MD PCP: Alroy Dust Supervising MD: Jens Som MD, Arlys John Indication 1: Atrial Fibrillation Lab Used: [facility] Site: [facility] INR POC 2.0 INR RANGE 2.0-3.0  Dietary changes: no    Health status changes: yes       Details: Larey Seat off 2ft ladder onto ladder.  Minimal bruising.    Bleeding/hemorrhagic complications: no     Any changes in medication regimen? no    Recent/future dental: no  Any missed doses?: no       Is patient compliant with meds? yes       Current Medications (verified): 1)  Warfarin Sodium 5 Mg Tabs (Warfarin Sodium) .... Take As Directed By Coumadin Clinic. 2)  Cardizem Cd 180 Mg Xr24h-Cap (Diltiazem Hcl Coated Beads) .... Take 1 Tablet By Mouth Once A Day 3)  Janumet 50-500 Mg Tabs (Sitagliptin-Metformin Hcl) .... Take 1 Tablet By Mouth Two Times A Day With Food 4)  Protonix 40 Mg Tbec (Pantoprazole Sodium) .... Take 1 Tablet By Mouth Once A Day 5)  Lipram 4500 20-4.5-25 Mu  Cpep (Amylase-Lipase-Protease) .... 2 Tabs Before Dinner 6)  Lexapro 10 Mg Tabs (Escitalopram Oxalate) .... Take 1 Tab By Mouth Once Daily As Directed...  Allergies (verified): 1)  ! Penicillin V Potassium (Penicillin V Potassium)  Anticoagulation Management History:      The patient is taking warfarin and comes in today for a routine follow up visit.  Positive risk factors for bleeding include presence of serious comorbidities.  Negative risk factors for bleeding include an age less than 64 years old.  The bleeding index is 'intermediate risk'.  Positive CHADS2 values include History of Diabetes.  Negative CHADS2 values include Age > 25 years old.  Anticoagulation responsible provider: Jens Som MD, Arlys John.  INR POC: 2.0.  Cuvette Lot#: [id number].  Exp: 11/2010.    Anticoagulation Management Assessment/Plan:      The patient's current anticoagulation dose is Warfarin sodium 5 mg  tabs: Take as directed by coumadin clinic..  The target INR is 2.0-3.0.  The next INR is due 10/05/2009.  Anticoagulation instructions were given to patient.  Results were reviewed/authorized by Cloyde Reams, RN, BSN.  He was notified by Cloyde Reams, RN, BSN.         Prior Anticoagulation Instructions: INR 2.5  Continue on same dosage 1 tablet daily except 1.5 tablets on Mondays and Fridays.  Recheck in 3 weeks.      Current Anticoagulation Instructions: INR 2.0  Continue on same dosage 1 tablet daily except 1.5 tablets on Mondays and Fridays.   Recheck in 4 weeks.

## 2010-11-01 NOTE — Assessment & Plan Note (Signed)
Summary: ? stomach bug/apc   Chief Complaint:  "shooting sensation" in stomach and upper back area and body aches x10days - no nausea/vomiting/diarrhea - going out of town next week.  History of Present Illness: 60 y/o WM here with known history of OSA, GERD, chronic pancreatitis-fb GI-Buccini, recurrent ABD pain-s/p Whipple.   10/09-- follow up visit and CPX... he has multiple medical problems as noted below... he is followed regularly by DrBuccini for GI...  November 26, 2008--Presents for acute office visit. 10 days ago, family w/ URI/GI bug, Then he develop cough, congestion, aches, this started improving, over last 3 days, nausea, bloated, gas, back pain, and last pm mid abdominal pains, reflux into throat. No vomitting, bloody stools, rash, chest pain, dyspnea, no change in stools, urinary symptoms, joint swelling. Good appetite. Mild urinay freq. over last week.        Prior Medications Reviewed Using: Patient Recall  Updated Prior Medication List: PANTOPRAZOLE SODIUM 40 MG  TBEC (PANTOPRAZOLE SODIUM) 1 tab daily LIPRAM 4500 20-4.5-25 MU  CPEP (AMYLASE-LIPASE-PROTEASE) 2 tabs before dinner WELLBUTRIN XL 150 MG XR24H-TAB (BUPROPION HCL) Take 1 tablet by mouth once a day LEXAPRO 10 MG TABS (ESCITALOPRAM OXALATE) Take 1 tablet by mouth once a day TRAZODONE HCL 100 MG TABS (TRAZODONE HCL) 1-2 at bedtime as needed  Current Allergies (reviewed today): ! PENICILLIN V POTASSIUM (PENICILLIN V POTASSIUM) Past Medical History:    SLEEP APNEA, MILD (ICD-780.57) - sleep study 1998 w/ RDI 14 & lowest sat 82%;  trial CPAP dc'd by pt; seen by DrClance & DrShoemaker;  sx improved w/ wt loss & currently denies sleep disordered breathing, no snoring, no daytime symptoms, etc...        PAROXYSMAL ATRIAL FIBRILLATION (ICD-427.31) - Hx recurrent PAF transiently after surg x2;  resolved w/ CCB Rx;  baseline EKG w/ NSR, WNL;  2DEcho w/ norm LVF, trace MR/ TR;  Cardiolite neg w/ EF= 62%...        LOW  HDL (ICD-272.5) - he's been off his diet w/ weight up to 201#.Marland Kitchen.     ~  last FLP 7/07 showed TChol 114, TG 92, HDL 29, LDL 67...        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 he required transient med Rx in 2005... diet controlled since then...     ~  labs 7/07 showed BS= 113, HgA1c= 5.7        ABDOMINAL PAIN, RECURRENT (ICD-789.00) - long complex hx starting in 2000 w/ abd pain, CBD stricture w/ Whipple procedure at [facility] in 2000;  subseq perf jejunal anastomotic ulcer w/ surg in [place] in 2005;  chr pancreatitis w/ recurrent abd pain & elevated LFT's (hosp in 2008 by GI);  gallstones w/ neg cholangitis work up - all followed by DrBuccini.            Family History:    Reviewed history from 07/20/2008 and no changes required:       mother deceased age 24 from brain cancer  hx of emphysema,asthma       father deceased age 80 from old age       1 sibling alive age 65  Social History:    Reviewed history from 07/20/2008 and no changes required:       non smoker       exercise= walks some       no caffeine use  no etoh       married       children   Risk Factors: Tobacco use:  never  Colonoscopy History:    Date of Last Colonoscopy:  11/07/2004  Review of Systems      See HPI  Vital Signs:  Patient Profile:   60 Years Old Male Weight:      204.38 pounds O2 Sat:      97 % O2 treatment:    Room Air Temp:     97.1 degrees F oral Pulse rate:   77 / minute BP sitting:   114 / 60  (right arm) Cuff size:   regular  Vitals Entered By: Boone Master CNA (November 26, 2008 9:48 AM)             Is Patient Diabetic? No Comments Medications reviewed with patient Boone Master CNA  November 26, 2008 9:54 AM     Physical Exam  WD, WN, 60 y/o WM in NAD... GENERAL:  Alert & oriented; pleasant & cooperative... HEENT:  [place]/AT,  , EACs-clear, TMs-wnl, NOSE-clear, THROAT-clear & wnl. NECK:   Supple w/ fairROM; no JVD; normal carotid impulses w/o bruits; no thyromegaly or nodules palpated; no lymphadenopathy. CHEST:  Clear to P & A; without wheezes/ rales/ or rhonchi heard... HEART:  Regular Rhythm; without murmurs/ rubs/ or gallops detected... ABDOMEN:  Soft & nontender; mult surg scars; normal bowel sounds; no organomegaly or masses palpated., no guarding/rebound, neg CVA tenderness.   EXT: without deformities, mild arthritic changes; no varicose veins/ venous insuffic/ or edema.     Impression & Recommendations:  Problem # 1:  ABDOMINAL PAIN, RECURRENT (ICD-789.00) Questionable etiology-?resolving viral GI illness. (recent URI symtpoms)-family w/ similar symptoms.  Check labs.  REC; Increase Protonix 40mg   two times a day before meals for 2 weeks Gas x as needed for gas/bloating.  Levsin 0.125mg  three times a day as needed abdominal cramping.  We are checking labs today.  Advance bland diet as tolerated.  Please contact office for sooner follow up if symptoms do not improve or worsen  If not improving will need follow up w/ GI -Buccini.  follow up Dr. Kriste Basque in 2-3 months.  Orders: TLB-BMP (Basic Metabolic Panel-BMET) (80048-METABOL) TLB-CBC Platelet - w/Differential (85025-CBCD) TLB-Hepatic/Liver Function Pnl (80076-HEPATIC) TLB-Amylase (82150-AMYL) TLB-Lipase (83690-LIPASE) TLB-Udip w/ Micro (81001-URINE) T-Urine Culture (Spectrum Order) ([id number]) Est. Patient Level III (09811)   Medications Added to Medication List This Visit: 1)  Lexapro 10 Mg Tabs (Escitalopram oxalate) .... Take 1 tablet by mouth once a day 2)  Trazodone Hcl 100 Mg Tabs (Trazodone hcl) .Marland Kitchen.. 1-2 at bedtime as needed 3)  Levsin 0.125 Mg Tabs (Hyoscyamine sulfate) .Marland Kitchen.. 1 by mouth three times a day as needed abdominal cramping.  Complete Medication List: 1)  Pantoprazole Sodium 40 Mg Tbec (Pantoprazole sodium) .Marland Kitchen.. 1 tab daily 2)  Lipram 4500 20-4.5-25 Mu Cpep (Amylase-lipase-protease) ....  2 tabs before dinner 3)  Wellbutrin Xl 150 Mg Xr24h-tab (Bupropion hcl) .... Take 1 tablet by mouth once a day 4)  Lexapro 10 Mg Tabs (Escitalopram oxalate) .... Take 1 tablet by mouth once a day 5)  Trazodone Hcl 100 Mg Tabs (Trazodone hcl) .Marland Kitchen.. 1-2 at bedtime as needed 6)  Levsin 0.125 Mg Tabs (Hyoscyamine sulfate) .Marland Kitchen.. 1 by mouth three times a day as needed abdominal cramping.  Patient Instructions: 1)  Increase Protonix 40mg   two times a day before meals for 2 weeks 2)  Gas x as needed for gas/bloating.  3)  Levsin 0.125mg  three times a day as needed abdominal cramping.  4)  We are checking labs today.  5)  Advance bland diet as tolerated.  6)  Please contact office for sooner follow up if symptoms do not improve or worsen  7)  follow up Dr. Kriste Basque in 2-3 months.  Prescriptions: LEVSIN 0.125 MG TABS (HYOSCYAMINE SULFATE) 1 by mouth three times a day as needed abdominal cramping.  #30 x 0   Entered and Authorized by:   Rubye Oaks NP   Signed by:   Shermeka Rutt NP on 11/26/2008   Method used:   Electronically to        [facility] (retail)       [address]       [place], [place]  [address]       Ph: [phone] or [phone]       Fax: [phone]   RxID:   [id number]

## 2010-11-01 NOTE — Assessment & Plan Note (Signed)
Summary: H1N1      Current Allergies: ! PENICILLIN V POTASSIUM (PENICILLIN V POTASSIUM)          ]  Flu Vaccine Consent Questions    Do you have a history of severe allergic reactions to this vaccine? no    Any prior history of allergic reactions to egg and/or gelatin? no    Do you have a sensitivity to the preservative Thimersol? no    Do you have a past history of Guillan-Barre Syndrome? no    Do you currently have an acute febrile illness? no    Have you ever had a severe reaction to latex? no    Vaccine information given and explained to patient? yes  H1N1 # 1    Vaccine Type: H1N1 vaccine G code    Site: left deltoid    Mfr: Novartis    Dose: 0.5 ml    Route: IM    Given by: Tammy Wilkins Elpers    Exp. Date: 12/30/2008    Lot #: [id number]    VIS given: 07/02/2009 given September 23, 2008.

## 2010-11-01 NOTE — Progress Notes (Signed)
Summary: appt  Phone Note Call from Patient Call back at Work Phone [phone]   Caller: Patient Call For: nadel Reason for Call: Talk to Nurse Summary of Call: pt was set up with urology for 11/18.  He wants to know if that can be changed to this week? Initial call taken by: Eugene Gavia,  August 03, 2008 8:42 AM  Follow-up for Phone Call        Called spoke with pt.  Pt states he is going out of town next week and would like to have test results sooner.  Wanted to see if sooner appt available.  Advised he could call urology office directly to inquire if sooner appt available.  Gave pt # to [facility]. Follow-up by: Cloyde Reams RN,  August 03, 2008 10:37 AM

## 2010-11-01 NOTE — Letter (Signed)
Summary: [facility]  [facility]   Imported By: Lester Perry 03/29/2009 08:31:55  _____________________________________________________________________  External Attachment:    Type:   Image     Comment:   External Document

## 2010-11-01 NOTE — Medication Information (Signed)
Summary: Diabetes Testing Supplies/[facility]  Diabetes Testing Supplies/[facility]   Imported By: Sherian Rein 06/09/2009 11:41:27  _____________________________________________________________________  External Attachment:    Type:   Image     Comment:   External Document

## 2010-11-01 NOTE — Medication Information (Signed)
Summary: ccr  Anticoagulant Therapy  Managed by: Eda Keys, PharmD Referring MD: Alroy Dust MD PCP: Alroy Dust Supervising MD: Daleen Squibb MD, Maisie Fus Indication 1: Atrial Fibrillation Lab Used: [facility] Site: [facility] INR POC 1.5 INR RANGE 2.0-3.0  Dietary changes: no    Health status changes: no    Bleeding/hemorrhagic complications: no    Recent/future hospitalizations: no    Any changes in medication regimen? no    Recent/future dental: no  Any missed doses?: no       Is patient compliant with meds? yes      Comments: Pt reports taking 1 tablet daily vs 1.5 tablets on Monday as instructed.  I have boosted today and pt will begin taking 1 tablet daily, except 1.5 tablets on mOndays.  Allergies: 1)  ! Penicillin V Potassium (Penicillin V Potassium)  Anticoagulation Management History:      The patient is taking warfarin and comes in today for a routine follow up visit.  Positive risk factors for bleeding include presence of serious comorbidities.  Negative risk factors for bleeding include an age less than 63 years old.  The bleeding index is 'intermediate risk'.  Positive CHADS2 values include History of Diabetes.  Negative CHADS2 values include Age > 36 years old.  Anticoagulation responsible provider: Daleen Squibb MD, Maisie Fus.  INR POC: 1.5.  Cuvette Lot#: [id number].  Exp: 01/2011.    Anticoagulation Management Assessment/Plan:      The patient's current anticoagulation dose is Warfarin sodium 5 mg tabs: Take as directed by coumadin clinic..  The target INR is 2.0-3.0.  The next INR is due 12/14/2009.  Anticoagulation instructions were given to patient.  Results were reviewed/authorized by Eda Keys, PharmD.  He was notified by Eda Keys.         Prior Anticoagulation Instructions: INR 2.3  Continue same dose of 1 tablet daily except 1.5 tablets on Mondays. Recheck in 3 weeks.  Current Anticoagulation Instructions: INR 1.5  Take 1.5 tablets today (an  extra 1/2 tablet).  Then start taking 1 tablet daily, except take 1.5 tablets on Monday.  Return to clinic in 3 weeks.

## 2010-11-01 NOTE — Consult Note (Signed)
Summary: [facility]  [facility]itis/[facility]   Imported By: Lester Berlin 07/23/2008 09:50:44  _____________________________________________________________________  External Attachment:    Type:   Image     Comment:   External Document

## 2010-11-01 NOTE — Progress Notes (Signed)
Summary: results  Phone Note Call from Patient   Caller: Patient Call For: nadel Summary of Call: lab results Initial call taken by: Rickard Patience,  March 04, 2009 3:48 PM  Follow-up for Phone Call        pt aware results not available will call when results are reviewed and signed Follow-up by: Marinus Maw,  March 04, 2009 4:11 PM

## 2010-11-01 NOTE — Assessment & Plan Note (Signed)
Summary: eye crud,achy, cough cong/in [facility] office/apc   CC:  prod cough with brown sputum, head congestion/pressure x12days - states coughs more late in the evening, and and has sweats on occasion.  History of Present Illness: 60 y/o WM here with known history of OSA, GERD, chronic pancreatitis-fb GI-Buccini, recurrent ABD pain-s/p Whipple.   10/09-- follow up visit and CPX... he has multiple medical problems as noted below... he is followed regularly by DrBuccini for GI...  November 26, 2008--Presents for acute office visit. 10 days ago, family w/ URI/GI bug, Then he develop cough, congestion, aches, this started improving, over last 3 days, nausea, bloated, gas, back pain, and last pm mid abdominal pains, reflux into throat. No vomitting, bloody stools, rash, chest pain, dyspnea, no change in stools, urinary symptoms, joint swelling. Good appetite. Mild urinay freq. over last week.  Feb 11, 2009--Presents for an acute office visit. Complains of  prod cough with brown sputum, head congestion/pressure x12days - states coughs more late in the evening, and has sweats on occasion. Not getting better. Getting run down. Denies chest pain, dyspnea, orthopnea, hemoptysis, fever, n/v/d, edema, headache.      Medications Prior to Update: 1)  Pantoprazole Sodium 40 Mg  Tbec (Pantoprazole Sodium) .Marland Kitchen.. 1 Tab Daily 2)  Lipram 4500 20-4.5-25 Mu  Cpep (Amylase-Lipase-Protease) .... 2 Tabs Before Dinner 3)  Wellbutrin Xl 150 Mg Xr24h-Tab (Bupropion Hcl) .... Take 1 Tablet By Mouth Once A Day 4)  Lexapro 10 Mg Tabs (Escitalopram Oxalate) .... Take 1 Tablet By Mouth Once A Day 5)  Trazodone Hcl 100 Mg Tabs (Trazodone Hcl) .Marland Kitchen.. 1-2 At Bedtime As Needed 6)  Levsin 0.125 Mg Tabs (Hyoscyamine Sulfate) .Marland Kitchen.. 1 By Mouth Three Times A Day As Needed Abdominal Cramping.  Current Medications (verified): 1)  Pantoprazole Sodium 40 Mg  Tbec (Pantoprazole Sodium) .Marland Kitchen.. 1 Tab Daily 2)  Lipram 4500 20-4.5-25 Mu  Cpep  (Amylase-Lipase-Protease) .... 2 Tabs Before Dinner  Allergies (verified): 1)  ! Penicillin V Potassium (Penicillin V Potassium)  Past History:  Past Surgical History:    S/P Whipple procedure by DrPappas ([facility]) 2000 - benign stricture in common bile duct, path showed fibrosis & inflammation.    S/P Elap w/ suture of perf jejunal ulcer & drainage of lesser sac abscess - 11/04 in [place].    S/P f/u ERCP at [facility] 7/05 by DrBranch w/ sludge vs stones near the junction of the CBD & cystic duct, no stones in the GB. (07/20/2008)  Family History:    mother deceased age 8 from brain cancer  hx of emphysema,asthma    father deceased age 79 from old age    1 sibling alive age 18 (07/20/2008)  Social History:    non smoker    exercise= walks some    no caffeine use    no etoh    married    children (07/20/2008)  Risk Factors:    Alcohol Use: N/A    >5 drinks/d w/in last 3 months: N/A    Caffeine Use: N/A    Diet: N/A    Exercise: N/A  Risk Factors:    Smoking Status: never (07/20/2008)    Packs/Day: N/A    Cigars/wk: N/A    Pipe Use/wk: N/A    Cans of tobacco/wk: N/A    Passive Smoke Exposure: N/A  Past Medical History:    SLEEP APNEA, MILD (ICD-780.57) - sleep study 1998 w/ RDI 14 & lowest sat 82%;  trial CPAP dc'd by pt;  seen by DrClance & DrShoemaker;  sx improved w/ wt loss & currently denies sleep disordered breathing, no snoring, no daytime symptoms, etc...        PAROXYSMAL ATRIAL FIBRILLATION (ICD-427.31) - Hx recurrent PAF transiently after surg x2;  resolved w/ CCB Rx;  baseline EKG w/ NSR, WNL;  2DEcho w/ norm LVF, trace MR/ TR;  Cardiolite neg w/ EF= 62%...        LOW HDL (ICD-272.5) - he's been off his diet w/ weight up to 201#.Marland Kitchen.     ~  last FLP 7/07 showed TChol 114, TG 92, HDL 29, LDL 67...        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 he required transient med Rx in 2005...  diet controlled since then...     ~  labs 7/07 showed BS= 113, HgA1c= 5.7        ABDOMINAL PAIN, RECURRENT (ICD-789.00) - long complex hx starting in 2000 w/ abd pain, CBD stricture w/ Whipple procedure at [facility] in 2000;  subseq perf jejunal anastomotic ulcer w/ surg in [place] in 2005;  chr pancreatitis w/ recurrent abd pain & elevated LFT's (hosp in 2008 by GI);  gallstones w/ neg cholangitis work up - all followed by DrBuccini.        GASTRITIS, ACUTE (ICD-535.00) - last EGD 2/06 by DrBuccini showed hemorrhagic gastritis...    Hx of ULCER, ACUTE GASTROJEJUNAL W/PERF W/O OBST (ICD-534.10) - EGD in 2004 showed a perforated jejunal ulcer that was oversewn, and a patent gastrojejunostomy...    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    PANCREATITIS, CHRONIC (ICD-577.1) - on CREON-20 caps 2 Tid w/ meals...    DIVERTICULOSIS, ASYMPTOMATIC (ICD-562.10) - colonoscopies 11/01 & 2/06 by DrBuccini w/o other abnormalities seen...    IRRITABLE BOWEL SYNDROME (ICD-564.1)        Hx of LIVER FUNCTION TESTS, ABNORMAL (ICD-794.8) -      ~  labs 9/07 showed AlkPhos 84, SGOT 43, SGPT 75...        Review of Systems      See HPI  Vital Signs:  Patient profile:   60 year old male Height:      69.5 inches Weight:      202 pounds BMI:     29.51 O2 Sat:      97 % Temp:     98.2 degrees F oral Pulse rate:   75 / minute BP sitting:   126 / 74  (left arm) Cuff size:   regular  Vitals Entered By: Boone Master CNA (Feb 11, 2009 2:52 PM)  O2 Sat at Rest %:  97 O2 Flow:  room air CC: prod cough with brown sputum, head congestion/pressure x12days - states coughs more late in the evening, and has sweats on occasion Is Patient Diabetic? No Comments Medications reviewed with patient Boone Master CNA  Feb 11, 2009 2:56 PM    Physical Exam  Additional Exam:  WD, WN, 60 y/o WM in NAD... GENERAL:  Alert & oriented; pleasant & cooperative... HEENT:  [place]/AT,  ,  EACs-clear, TMs-wnl, NOSE-clear, THROAT-clear & wnl. NECK:  Supple w/ fairROM; no JVD; normal carotid impulses w/o bruits; no thyromegaly or nodules palpated; no lymphadenopathy. CHEST:  Clear to P & A; without wheezes/ rales/ or rhonchi heard... HEART:  Regular Rhythm; without murmurs/ rubs/ or gallops detected... ABDOMEN:  Soft &  nontender; mult surg scars; normal bowel sounds; no organomegaly or masses palpated  EXT: without deformities, mild arthritic changes; no varicose veins/ venous insuffic/ or edema.     Impression & Recommendations:  Problem # 1:  UPPER RESPIRATORY INFECTION, ACUTE (ICD-465.9)  Slow to resovle, ?bronchitis Zithromax 500mg  once daily for 3 days Mucinex DM two times a day as needed cough/congestion Zyrtec 10mg  at bedtime as needed drainage.  Saline nasal rinses as needed  Please contact office for sooner follow up if symptoms do not improve or worsen   Orders: Est. Patient Level IV ([address])  Medications Added to Medication List This Visit: 1)  Zithromax 500 Mg Tabs (Azithromycin) .Marland Kitchen.. 1 by mouth once daily  Complete Medication List: 1)  Pantoprazole Sodium 40 Mg Tbec (Pantoprazole sodium) .Marland Kitchen.. 1 tab daily 2)  Lipram 4500 20-4.5-25 Mu Cpep (Amylase-lipase-protease) .... 2 tabs before dinner 3)  Zithromax 500 Mg Tabs (Azithromycin) .Marland Kitchen.. 1 by mouth once daily  Patient Instructions: 1)  Zithromax 500mg  once daily for 3 days 2)  Mucinex DM two times a day as needed cough/congestion 3)  Zyrtec 10mg  at bedtime as needed drainage.  4)  Saline nasal rinses as needed  5)  Please contact office for sooner follow up if symptoms do not improve or worsen  Prescriptions: ZITHROMAX 500 MG TABS (AZITHROMYCIN) 1 by mouth once daily  #3 x 0   Entered and Authorized by:   Rubye Oaks NP   Signed by:   Mirayah Wren NP on 02/11/2009   Method used:   Electronically to        [facility] (retail)       [address]       [place], [place]  [address]       Ph: [phone] or [phone]       Fax: [phone]   RxID:   [id number]

## 2010-11-01 NOTE — Progress Notes (Signed)
Summary: sick  Phone Note Call from Patient Call back at Work Phone [phone]   Caller: Patient Call For: nadel Reason for Call: Acute Illness, Talk to Nurse Complaint: Cough/Sore throat Summary of Call: eye crud, tingling in throat, achy all over, fells bad, sweats, coughing, some congestion, head and chest.  Has been trying to take OTC meds since Sunday , not helping. [facility] Initial call taken by: Eugene Gavia,  Feb 10, 2009 1:47 PM  Follow-up for Phone Call        pt seeing tp this afternoon in [facility]. Follow-up by: Eugene Gavia,  Feb 11, 2009 10:35 AM  Additional Follow-up for Phone Call Additional follow up Details #1::        pt has arrived for his appt with Tammy Parrett in the [facility] office.  will sign off on this message. Additional Follow-up by: Boone Master CNA,  Feb 11, 2009 2:48 PM

## 2010-11-01 NOTE — Medication Information (Signed)
Summary: rov/ewj/jss  Anticoagulant Therapy  Managed by: Weston Brass, PharmD Referring MD: Alroy Dust MD PCP: Alroy Dust Supervising MD: Graciela Husbands MD, Viviann Spare Indication 1: Atrial Fibrillation Lab Used: [facility] Site: [facility] INR POC 2.8 INR RANGE 2.0-3.0  Dietary changes: no    Health status changes: no    Bleeding/hemorrhagic complications: no    Recent/future hospitalizations: yes       Details: November 12 - Surgery at [facility] to reconstruct the common bile duct.  Any changes in medication regimen? no    Recent/future dental: no  Any missed doses?: no       Is patient compliant with meds? yes      Comments: Patient had been taking 2 tablets instead of 1 1/2 on days scheduled for 1 1/2 -- only for   Current Medications (verified): 1)  Warfarin Sodium 5 Mg Tabs (Warfarin Sodium) .... Take As Directed By Coumadin Clinic. 2)  Cardizem Cd 180 Mg Xr24h-Cap (Diltiazem Hcl Coated Beads) .... Take 1 Tablet By Mouth Once A Day 3)  Janumet 50-500 Mg Tabs (Sitagliptin-Metformin Hcl) .... Take 1 Tablet By Mouth Two Times A Day With Food 4)  Protonix 40 Mg Tbec (Pantoprazole Sodium) .... Take 1 Tablet By Mouth Once A Day 5)  Lipram 4500 20-4.5-25 Mu  Cpep (Amylase-Lipase-Protease) .... 2 Tabs Before Dinner 6)  Lexapro 10 Mg Tabs (Escitalopram Oxalate) .... Take 1 Tab By Mouth Once Daily As Directed...  Allergies (verified): 1)  ! Penicillin V Potassium (Penicillin V Potassium)  Anticoagulation Management History:      The patient is taking warfarin and comes in today for a routine follow up visit.  Positive risk factors for bleeding include presence of serious comorbidities.  Negative risk factors for bleeding include an age less than 15 years old.  The bleeding index is 'intermediate risk'.  Positive CHADS2 values include History of Diabetes.  Negative CHADS2 values include Age > 53 years old.  Anticoagulation responsible provider: Graciela Husbands MD, Viviann Spare.  INR POC: 2.8.  Cuvette Lot#:  [id number].  Exp: 08/2010.    Anticoagulation Management Assessment/Plan:      The patient's current anticoagulation dose is Warfarin sodium 5 mg tabs: Take as directed by coumadin clinic..  The target INR is 2.0-3.0.  The next INR is due 07/19/2009.  Anticoagulation instructions were given to patient.  Results were reviewed/authorized by Weston Brass, PharmD.  He was notified by Fanny Skates, PharmD Candidate.         Prior Anticoagulation Instructions: INR 1.5  Take 2 tablets today then start 1.5 tablets daily except 1 tablet on Tuesdays, Thursdays and Sundays.  Recheck in   Current Anticoagulation Instructions: INR 2.8  Take 1 1/2 tablets on Mondays, Wednesdays, and Fridays, and take 1 tablet on all other days.  Return to clinic on 07/19/09.

## 2010-11-01 NOTE — Medication Information (Signed)
Summary: rov/eac  Anticoagulant Therapy  Managed by: Weston Brass, PharmD Referring MD: Alroy Dust MD PCP: Alroy Dust Supervising MD: Clifton James MD, Cristal Deer Indication 1: Atrial Fibrillation Lab Used: [facility] Site: [facility] INR POC 2.1 INR RANGE 2.0-3.0  Dietary changes: no    Health status changes: no    Bleeding/hemorrhagic complications: no    Recent/future hospitalizations: no    Any changes in medication regimen? yes       Details: restarting Lexapro and Wellbutrin   Recent/future dental: no  Any missed doses?: yes     Details: Missed 2 doses of Coumadin while on a trip; called clinic- pt has taken extra 1/2 tablet for past 3 days   Is patient compliant with meds? yes       Allergies: 1)  ! Penicillin V Potassium (Penicillin V Potassium)  Anticoagulation Management History:      The patient is taking warfarin and comes in today for a routine follow up visit.  Positive risk factors for bleeding include presence of serious comorbidities.  Negative risk factors for bleeding include an age less than 36 years old.  The bleeding index is 'intermediate risk'.  Positive CHADS2 values include History of Diabetes.  Negative CHADS2 values include Age > 27 years old.  Anticoagulation responsible provider: Clifton James MD, Cristal Deer.  INR POC: 2.1.  Cuvette Lot#: [id number].  Exp: 01/2011.    Anticoagulation Management Assessment/Plan:      The patient's current anticoagulation dose is Warfarin sodium 5 mg tabs: Take as directed by coumadin clinic..  The target INR is 2.0-3.0.  The next INR is due 01/17/2010.  Anticoagulation instructions were given to patient.  Results were reviewed/authorized by Weston Brass, PharmD.  He was notified by Weston Brass PharmD.         Prior Anticoagulation Instructions: INR 1.5  Take 1.5 tablets today (an extra 1/2 tablet).  Then start taking 1 tablet daily, except take 1.5 tablets on Monday.  Return to clinic in 3 weeks.    Current  Anticoagulation Instructions: INR 2.1  Continue same dose of 1 tablet every day except 1 1/2 tablet on Monday

## 2010-11-01 NOTE — Medication Information (Signed)
Summary: rov/tm  Anticoagulant Therapy  Managed by: Shelby Dubin, PharmD Referring MD: Alroy Dust MD PCP: Alroy Dust Supervising MD: Antoine Poche MD, Fayrene Fearing Indication 1: Atrial Fibrillation Lab Used: [facility] Site: [facility] INR POC 2.3 INR RANGE 2.0-3.0  Dietary changes: no    Health status changes: no    Bleeding/hemorrhagic complications: no    Recent/future hospitalizations: no    Any changes in medication regimen? no    Recent/future dental: no  Any missed doses?: no       Is patient compliant with meds? yes       Allergies (verified): 1)  ! Penicillin V Potassium (Penicillin V Potassium)  Anticoagulation Management History:      The patient is taking warfarin and comes in today for a routine follow up visit.  Positive risk factors for bleeding include presence of serious comorbidities.  Negative risk factors for bleeding include an age less than 61 years old.  The bleeding index is 'intermediate risk'.  Positive CHADS2 values include History of Diabetes.  Negative CHADS2 values include Age > 61 years old.  Anticoagulation responsible provider: Antoine Poche MD, Fayrene Fearing.  INR POC: 2.3.  Cuvette Lot#: [id number].  Exp: 11/2010.    Anticoagulation Management Assessment/Plan:      The patient's current anticoagulation dose is Warfarin sodium 5 mg tabs: Take as directed by coumadin clinic..  The target INR is 2.0-3.0.  The next INR is due 11/15/2009.  Anticoagulation instructions were given to patient.  Results were reviewed/authorized by Shelby Dubin, PharmD.  He was notified by Lew Dawes, PharmD Candidate.         Prior Anticoagulation Instructions: INR 4.6 Skip today's dose then change dose to 5mg s daily except 7.5mg s on  Mondays. Recheck in 10-14 days.   Current Anticoagulation Instructions: INR 2.3  Continue same dose of 1 tablet daily except 1.5 tablets on Mondays. Recheck in 3 weeks.

## 2010-11-01 NOTE — Assessment & Plan Note (Signed)
Summary: [facility]   Visit Type:  Initial Consult Primary Provider:  nadel, scott  CC:  Anxiety.  History of Present Illness: 60 year old male for evaluation of atrial fibrillation. Patient apparently had a Cardiolite in December of 2004 that was normal I do not have those records available. Echocardiogram in August 2010 revealed normal LV function, moderate mitral regurgitation, trivial aortic insufficiency and mild left atrial enlargement. The patient states he has had 4 separate episodes of atrial fibrillation. These episodes  occurred either postoperatively or under stress of a significant illness by his report. He does not have significant symptoms during these episodes. No palpitations, shortness of in breath or chest pain. The patient apparently had his last  episode of atrial fibrillation in August 2010. He was treated with Cardizem and Coumadin. He would like to come off Coumadin if possible. He otherwise denies dyspnea on exertion, orthopnea, PND or exertional chest pain.  Current Medications (verified): 1)  Warfarin Sodium 5 Mg Tabs (Warfarin Sodium) .... Take As Directed By Coumadin Clinic. 2)  Cardizem Cd 180 Mg Xr24h-Cap (Diltiazem Hcl Coated Beads) .... Take 1 Tablet By Mouth Once A Day 3)  Janumet 50-500 Mg Tabs (Sitagliptin-Metformin Hcl) .... Take 1 Tablet By Mouth Two Times A Day With Food 4)  Protonix 40 Mg Tbec (Pantoprazole Sodium) .... Take 1 Tablet By Mouth Once A Day 5)  Lexapro 10 Mg Tabs (Escitalopram Oxalate) .... Take 1 Tab By Mouth Once Daily As Directed... 6)  Clonazepam 0.5 Mg Tabs (Clonazepam) .... Take 1 Tab By Mouth At Bedtime 7)  Creon 24000 Unit Cpep (Pancrelipase (Lip-Prot-Amyl)) .... 2 Capsules W/ Meal 8)  Bupropion Hcl 150 Mg Xr24h-Tab (Bupropion Hcl) .... Take 1 Tablet By Mouth Once A Day  Allergies: 1)  ! Penicillin V Potassium (Penicillin V Potassium)  Past History:  Past Medical History: Reviewed history from 02/08/2010 and no changes  required. SLEEP APNEA, MILD (ICD-780.57) - sleep study 1998 w/ RDI 14 & lowest sat 82%;  trial CPAP dc'd by pt; seen by DrClance & DrShoemaker  PAROXYSMAL ATRIAL FIBRILLATION (ICD-427.31) - Hx recurrent PAF transiently after surg x2 & again 8/10 hosp... resolved w/ CCB Rx, but now on CARDIZEM CD 180mg /d + COUMADIN (coumadin clinic)...  ~  baseline EKG w/ NSR, WNL  ~  2DEcho 12/04 w/ norm LVF, trace MR/ TR  ~  Cardiolite 12/04 neg w/ EF= 62%  ~  8/10 hosp- initial EKG= NSR, WNL;  subseq AFib w/ rvr... 2DEcho showed norm LV w/ EF= 55-60%, no wall motion abn, redundant atrial septum, mild dil LA= 49mm, mod MR...  LOW HDL (ICD-272.5) - he's been trying to control w/ diet + exercise...  ~  FLP 7/07 showed TChol 114, TG 92, HDL 29, LDL 67...  ~  FLP 10/09 showed TChol 165, TG 166, HDL 37, LDL 95  DIABETES MELLITUS, TYPE II (ICD-250.00) - now on JANUMET 50-500 Bid... there is a family hx of diabetes, and he had CBD stricture w/ Whipple procedure 2000...   ~  labs 7/07 showed BS= 113, HgA1c= 5.7  ~  labs 10/09 showed BS= 126  ~  labs 6/10 showed BS= 148, A1c= 8.0.Victor Garrett. rec> start JANUMET 50-500 Bid...  ~  labs 8/10 in hosp showed BS=90-130s & A1c= 6.2  ABDOMINAL PAIN, RECURRENT (ICD-789.00) - long complex hx starting in 2000 w/ abd pain, benign CBD stricture w/ Whipple procedure at [facility] in 2000;  subseq perf jejunal anastomotic ulcer w/ surg in [place] in 2005;  chr pancreatitis  w/ recurrent abd pain & elevated LFT's (hosp in 2008 by GI);  gallstones w/ neg cholangitis work up - all followed by DrBuccini.  ~  Hosp 8/10 w/ abd pain, nausea, elevated LFTs... gallstones, ?cholecystitis- Rx antibiotics & set back to [facility]... DrPappas plans surg in Nov.  GASTRITIS, ACUTE (ICD-535.00) - on PROTONIX 40mg /d...last EGD 2/06 by DrBuccini showed hemorrhagic gastritis... Hx of ULCER, ACUTE GASTROJEJUNAL W/PERF W/O OBST (ICD-534.10) - EGD in 2004 showed a perforated jejunal ulcer   Past Surgical History: Reviewed  history from 06/22/2009 and no changes required. S/P Whipple procedure by DrPappas ([facility]) 2000 - 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 S/P Elap w/ suture of perf jejunal ulcer & drainage of lesser sac abscess - 11/04 in [place]... S/P f/u ERCP at [facility] 7/05 by DrBranch w/ sludge vs stones near the junction of the CBD & cystic duct, no stones in the GB...  Family History: Reviewed history from 07/20/2008 and no changes required. mother deceased age 55 from brain cancer  hx of emphysema, asthma father deceased age 40 from old age; previous CAD began in his 31's 1 sibling alive age 2  Social History: Reviewed history from 07/20/2008 and no changes required. non smoker exercise= walks some no caffeine use no etoh married children  Review of Systems       no fevers or chills, productive cough, hemoptysis, dysphasia, odynophagia, melena, hematochezia, dysuria, hematuria, rash, seizure activity, orthopnea, PND, pedal edema, claudication. Remaining systems are negative.   Vital Signs:  Patient profile:   60 year old male Height:      69.5 inches Weight:      172.25 pounds BMI:     25.16 Pulse rate:   76 / minute Pulse rhythm:   regular Resp:     18 per minute BP sitting:   124 / 80  (left arm) Cuff size:   large  Vitals Entered By: Vikki Ports (Feb 09, 2010 11:07 AM)  Physical Exam  General:  Well developed/well nourished in NAD Skin warm/dry Patient not depressed No peripheral clubbing Back-normal HEENT-normal/normal eyelids Neck supple/normal carotid upstroke bilaterally; no bruits; no JVD; no thyromegaly chest - CTA/ normal expansion CV - RRR/normal S1 and S2; no murmurs, rubs or gallops;  PMI nondisplaced Abdomen -NT/ND, no HSM, no mass, + bowel sounds, no bruit; status post previous abdominal surgery. 2+ femoral pulses, no bruits Ext-no edema, chords, 2+ DP Neuro-grossly nonfocal     EKG  Procedure date:   02/09/2010  Findings:      NSR with short PR interval; no ST changes.  Impression & Recommendations:  Problem # 1:  PAROXYSMAL ATRIAL FIBRILLATION (ICD-427.31) The patient has a history of atrial fibrillation thisoccurring during admissions predominately for surgery. However he does not have any symptoms with these episodes and certainly could be having atrial fibrillation at other times. He is in sinus rhythm at present. His only embolic risk factor is diabetes mellitus. I therefore think that we can discontinue his Coumadin and begin enteric-coated aspirin 81 mg p.o. daily. I am not adding a full aspirin as he has a significant history of peptic ulcer disease. Note the previous echocardiogram showed normal LV function. He will continue on Cardizem for rate control if atrial fibrillation recurs. He understands there continues to be a risk of embolic events off of Coumadin. His updated medication list for this problem includes:    Warfarin Sodium 5 Mg Tabs (Warfarin sodium) .Victor Garrett... Take as directed by coumadin  clinic.  His updated medication list for this problem includes:    Warfarin Sodium 5 Mg Tabs (Warfarin sodium) .Victor Garrett... Take as directed by coumadin clinic.  Problem # 2:  MITRAL REGURGITATION (ICD-396.3) Plan followup echocardiogram in one year when he returns.  Problem # 3:  DIABETES MELLITUS, TYPE II (ICD-250.00) Management per primary care. His updated medication list for this problem includes:    Janumet 50-500 Mg Tabs (Sitagliptin-metformin hcl) .Victor Garrett... Take 1 tablet by mouth two times a day with food  Problem # 4:  IRRITABLE BOWEL SYNDROME (ICD-564.1)  Patient Instructions: 1)  Your physician recommends that you schedule a follow-up appointment in: ONE YEAR 2)  Your physician has recommended you make the following change in your medication: STOP COUMADIN AFTER CURRENT SCRIPT 3)  START ASPIRIN 81MG  ONCE DAILY WHEN FINISHED WITH COUMADIN

## 2010-11-01 NOTE — Assessment & Plan Note (Signed)
Summary: cpx 1 yr///kp   Chief Complaint:  15 month ROV & CPX....  History of Present Illness: 60 y/o WM here for a follow up visit and CPX... he has multiple medical problems as noted below... he is followed regularly by DrBuccini for GI...    Current Problem List:  SLEEP APNEA, MILD (ICD-780.57) - sleep study 1998 w/ RDI 14 & lowest sat 82%;  trial CPAP dc'd by pt; seen by DrClance & DrShoemaker;  sx improved w/ wt loss & currently denies sleep disordered breathing, no snoring, no daytime symptoms, etc...  PAROXYSMAL ATRIAL FIBRILLATION (ICD-427.31) - Hx recurrent PAF transiently after surg x2;  resolved w/ CCB Rx;  baseline EKG w/ NSR, WNL;  2DEcho w/ norm LVF, trace MR/ TR;  Cardiolite neg w/ EF= 62%...  LOW HDL (ICD-272.5) - he's been off his diet w/ weight up to 201#.Marland Kitchen.  ~  last FLP 7/07 showed TChol 114, TG 92, HDL 29, LDL 67...  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 he required transient med Rx in 2005... diet controlled since then...  ~  labs 7/07 showed BS= 113, HgA1c= 5.7  ABDOMINAL PAIN, RECURRENT (ICD-789.00) - long complex hx starting in 2000 w/ abd pain, CBD stricture w/ Whipple procedure at [facility] in 2000;  subseq perf jejunal anastomotic ulcer w/ surg in [place] in 2005;  chr pancreatitis w/ recurrent abd pain & elevated LFT's (hosp in 2008 by GI);  gallstones w/ neg cholangitis work up - all followed by DrBuccini.  GASTRITIS, ACUTE (ICD-535.00) - last EGD 2/06 by DrBuccini showed hemorrhagic gastritis... Hx of ULCER, ACUTE GASTROJEJUNAL W/PERF W/O OBST (ICD-534.10) - EGD in 2004 showed a perforated jejunal ulcer that was oversewn, and a patent gastrojejunostomy... 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 PANCREATITIS, CHRONIC (ICD-577.1) - on CREON-20 caps 2 Tid w/ meals... DIVERTICULOSIS, ASYMPTOMATIC (ICD-562.10) - colonoscopies 11/01 &  2/06 by DrBuccini w/o other abnormalities seen... IRRITABLE BOWEL SYNDROME (ICD-564.1)  Hx of LIVER FUNCTION TESTS, ABNORMAL (ICD-794.8) -   ~  labs 9/07 showed AlkPhos 84, SGOT 43, SGPT 75...      Current Allergies (reviewed today): ! 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    S/P Elap w/ suture of perf jejunal ulcer & drainage of lesser sac abscess - 11/04 in [place].    S/P f/u [facility]CP at [facility] 7/05 by DrBranch w/ sludge vs stones near the junction of the CBD & cystic duct, no stones in the GB.   Family History:    mother deceased age 47 from brain cancer  hx of emphysema,asthma    father deceased age 56 from old age  1 sibling alive age 33  Social History:    non smoker    exercise= walks some    no caffeine use    no etoh    married    children   Risk Factors:  Tobacco use:  never   Review of Systems       The patient complains of gas/bloating.  The patient denies fever, chills, sweats, anorexia, fatigue, weakness, malaise, weight loss, sleep disorder, blurring, diplopia, eye irritation, eye discharge, vision loss, eye pain, photophobia, earache, ear discharge, tinnitus, decreased hearing, nasal congestion, nosebleeds, sore throat, hoarseness, chest pain, palpitations, syncope, dyspnea on  exertion, orthopnea, PND, peripheral edema, cough, dyspnea at rest, excessive sputum, hemoptysis, wheezing, pleurisy, nausea, vomiting, diarrhea, constipation, change in bowel habits, abdominal pain, melena, hematochezia, jaundice, indigestion/heartburn, dysphagia, odynophagia, dysuria, hematuria, urinary frequency, urinary hesitancy, nocturia, incontinence, back pain, joint pain, joint swelling, muscle cramps, muscle weakness, stiffness, arthritis, sciatica, restless legs, leg pain at night, leg pain with exertion, rash, itching, dryness, suspicious lesions, paralysis, paresthesias, seizures, tremors, vertigo, transient blindness, frequent falls, frequent headaches, difficulty walking, depression, anxiety, memory loss, confusion, cold intolerance, heat intolerance, polydipsia, polyphagia, polyuria, unusual weight change, abnormal bruising, bleeding, enlarged lymph nodes, urticaria, allergic rash, hay fever, and recurrent infections.     Vital Signs:  Patient Profile:   60 Years Old Male Weight:      201 pounds O2 Sat:      98 % O2 treatment:    Room Air Temp:     98.2 degrees F oral Pulse rate:   74 / minute BP sitting:   112 / 58  (left arm) Cuff size:   regular  Vitals Entered By: Marijo File CMA (July 20, 2008 2:17 PM)                 Physical Exam  WD, WN, 60 y/o WM in NAD... GENERAL:  Alert & oriented; pleasant & cooperative... HEENT:  [place]/AT, EOM-wnl, PERRLA, EACs-clear, TMs-wnl, NOSE-clear, THROAT-clear & wnl. NECK:  Supple w/ fairROM; no JVD; normal carotid impulses w/o bruits; no thyromegaly or nodules palpated; no lymphadenopathy. CHEST:  Clear to P & A; without wheezes/ rales/ or rhonchi heard... HEART:  Regular Rhythm; without murmurs/ rubs/ or gallops detected... ABDOMEN:  Soft & nontender; mult surg scars; normal bowel sounds; no organomegaly or masses palpated... RECTAL:  Neg - prostate 2+ & nontender w/o nodules; stool hematest neg. EXT: without deformities, mild  arthritic changes; no varicose veins/ venous insuffic/ or edema. NEURO:  CN's intact; motor testing normal; sensory testing normal; gait normal & balance OK. DERM:  No lesions noted; no rash etc...        Impression & Recommendations:  Problem # 1:  PHYSICAL EXAMINATION (ICD-V70.0) He is doing much better at present... needs to ret for FASTING labs... OK Flu shot.  Orders: EKG w/ Interpretation (93000)  ~ ~ NSR, WNL... T-1 View CXR, Same Day (71010.6TC)  ~ ~ NAD...   Problem # 2:  SLEEP APNEA, MILD (ICD-780.57) No current symptoms... doing well, needs to get weight down...  Problem # 3:  LOW HDL (ICD-272.5) Needs f/u FLP since he's put the weight back on...Marland KitchenMarland KitchenMarland Kitchen  Problem # 4:  GLUCOSE INTOLERANCE (ICD-271.3) He will ret for Fasting labs... discussed diet & exercise program...  Problem # 5:  ABDOMINAL PAIN, RECURRENT (ICD-789.00) No recent problems... he is followed regularly by DrBuccini and last seen 9/09... continue Protonix & the pancreatic enz...  Problem # 6:  PAROXYSMAL ATRIAL FIBRILLATION (ICD-427.31)  This occured post op x2... pt advised to avoid caffeine, stimulants, etc...  Problem # 7:  Hx of LIVER FUNCTION TESTS, ABNORMAL (ICD-794.8) Due for f/u labs as noted...  Complete Medication List: 1)  Pantoprazole Sodium 40 Mg Tbec (Pantoprazole sodium) .Marland Kitchen.. 1 tab daily 2)  Lipram 4500 20-4.5-25 Mu Cpep (Amylase-lipase-protease) .... 2 tabs before dinner 3)  Wellbutrin Xl 150 Mg Xr24h-tab (Bupropion hcl) .... Take 1 tablet by mouth once a day   Patient Instructions: 1)  Today we updated your med list- see below.... 2)  We gave you the seasonal flu vaccine today... 3)  We also did your follow up CXR... please return to our lab one day next week for your FASTING blood work... then please call the "phone tree" in a few days for your lab results.Marland KitchenMarland Kitchen 4)  Call for any problems...   ]

## 2010-11-01 NOTE — Progress Notes (Signed)
Summary: lab results  Phone Note Call from Patient Call back at [phone]   Caller: Patient Call For: nadel Summary of Call: pt have not received lab results from cpx. also he have been experiencing heart papitations. Initial call taken by: Rickard Patience,  May 31, 2010 10:05 AM  Follow-up for Phone Call        labs unsigned in EMR.  labs and phone printed for SN to review. Boone Master CNA/MA  May 31, 2010 10:15 AM    lmomtcb Randell Loop [facility]  May 31, 2010 3:59 PM   Additional Follow-up for Phone Call Additional follow up Details #1::        called and spoke with pt and he is aware of his lab results and to cut the janumet to 1/2 tab two times a day and med list has been updated today.   per pt---he stated that yesterday he had spells of palps and had a fainting feeling during this time.  denies cp, or nausea with this.   he is wanting to know what to do about this.    per SN---get ekg if he having symptoms--palps at that time.  if persistant/recurrent then he will need a cardiology eval---he will need to stop all caffeine and monitor the palps.  Randell Loop [facility]  May 31, 2010 4:23 PM     Additional Follow-up for Phone Call Additional follow up Details #2::    LMOMTCB Vernie Murders  May 31, 2010 4:52 PM  Spoke with pt and notified of recs per SN.  Pt verbalized understanding and states that he already has seen Dr Jens Som in the past and if notices palps again will call for ov with him and also let SN know. Follow-up by: Vernie Murders,  May 31, 2010 5:03 PM  New/Updated Medications: JANUMET 50-500 MG TABS (SITAGLIPTIN-METFORMIN HCL) Take 1/2  tablet by mouth two times a day with food

## 2010-11-01 NOTE — Medication Information (Signed)
Summary: rov/sp  Anticoagulant Therapy  Managed by: Bethena Midget, RN, BSN Referring MD: Alroy Dust MD PCP: Alroy Dust Supervising MD: Juanda Chance MD, Jeptha Hinnenkamp Indication 1: Atrial Fibrillation Lab Used: [facility] Site: [facility] INR POC 2.5 INR RANGE 2.0-3.0  Dietary changes: no    Health status changes: no    Bleeding/hemorrhagic complications: no    Recent/future hospitalizations: no    Any changes in medication regimen? no    Recent/future dental: no  Any missed doses?: no       Is patient compliant with meds? yes      Comments: Pt. states he's been taking 5mg s daily and 7.5mg s on M&F since last visit.  Allergies: 1)  ! Penicillin V Potassium (Penicillin V Potassium)  Anticoagulation Management History:      The patient is taking warfarin and comes in today for a routine follow up visit.  Positive risk factors for bleeding include presence of serious comorbidities.  Negative risk factors for bleeding include an age less than 34 years old.  The bleeding index is 'intermediate risk'.  Positive CHADS2 values include History of Diabetes.  Negative CHADS2 values include Age > 27 years old.  Anticoagulation responsible provider: Juanda Chance MD, Smitty Cords.  INR POC: 2.5.  Cuvette Lot#: [id number].  Exp: 01/2011.    Anticoagulation Management Assessment/Plan:      The patient's current anticoagulation dose is Warfarin sodium 5 mg tabs: Take as directed by coumadin clinic..  The target INR is 2.0-3.0.  The next INR is due 02/14/2010.  Anticoagulation instructions were given to patient.  Results were reviewed/authorized by Bethena Midget, RN, BSN.  He was notified by Bethena Midget, RN, BSN.         Prior Anticoagulation Instructions: INR 2.1  Continue same dose of 1 tablet every day except 1 1/2 tablet on Monday   Current Anticoagulation Instructions: INR 2.5 Continue 5mg s daily except 7.5mg s on Mondays and Fridays. Recheck in 4 weeks.

## 2010-11-01 NOTE — Progress Notes (Signed)
  Phone Note Call from Patient   Caller: Patient Call For: Coumadin Clinic Summary of Call: Victor Garrett calls to notify the clinic that he is out of town on a business trip and has lost his medications, including his coumadin.  He took a dose yesterday, and will miss today's dose and possibly tomorrow's dose (in the case that his flight is delayed).  I have instructed patient to take an extra 1/2 tablet on Saturday and Sunday (1.5 tablets on Saturday and Sunday).  Patient's normal dose is 1 tablet (5 mg) everyday, except 1.5 tablets (7.5 mg) on Friday.  I have scheduled follow-up with patient on Monday, 12/27/09.   Initial call taken by: Eda Keys, PharmD

## 2010-11-01 NOTE — Letter (Signed)
[facility]: [facility]   Imported By: Sherian Rein 05/16/2010 12:55:54  _____________________________________________________________________  External Attachment:    Type:   Image     Comment:   External Document

## 2010-11-01 NOTE — Medication Information (Signed)
Summary: Coumadin Clinic  Anticoagulant Therapy  Managed by: Inactive Referring MD: Alroy Dust MD PCP: Alroy Dust Supervising MD: Juanda Chance MD, Bruce Indication 1: Atrial Fibrillation Lab Used: [facility] Site: [facility] INR RANGE 2.0-3.0          Comments: Pt changed to ASA by Dr. Jens Som  Allergies: 1)  ! Penicillin V Potassium (Penicillin V Potassium)  Anticoagulation Management History:      Positive risk factors for bleeding include presence of serious comorbidities.  Negative risk factors for bleeding include an age less than 2 years old.  The bleeding index is 'intermediate risk'.  Positive CHADS2 values include History of Diabetes.  Negative CHADS2 values include Age > 60 years old.  Anticoagulation responsible provider: Juanda Chance MD, Smitty Cords.  Exp: 01/2011.    Anticoagulation Management Assessment/Plan:      The patient's current anticoagulation dose is Warfarin sodium 5 mg tabs: Take as directed by coumadin clinic..  The target INR is 2.0-3.0.  The next INR is due 02/14/2010.  Anticoagulation instructions were given to patient.  Results were reviewed/authorized by Inactive.         Prior Anticoagulation Instructions: INR 2.5 Continue 5mg s daily except 7.5mg s on Mondays and Fridays. Recheck in 4 weeks.

## 2010-11-01 NOTE — Progress Notes (Signed)
Summary: rx  Phone Note Call from Patient Call back at Work Phone [phone]   Caller: Patient Call For: Kadejah Sandiford Reason for Call: Acute Illness, Talk to Nurse Summary of Call: stomach cramps, back aches, bloating, painful, wants to get cleared up [facility] Initial call taken by: Eugene Gavia,  November 25, 2008 10:37 AM  Follow-up for Phone Call        pt already has appt in the am to see TP at 9:30---pt stated that pain started yesterday in his back---congestion has cleared up---having lots of bloating and gas and has learned that eating aggravates this.  hx of pancreatitis but pt stated that this does not feel like that.  told pt that SN is out of the office today and if he starts to feel bad, then give [place] a call back and we will try to get him something called in. Marijo File CMA  November 25, 2008 10:50 AM

## 2010-11-01 NOTE — Assessment & Plan Note (Signed)
Summary: SICK/CONGESTION/CB   CC:  weakness, joint achiness/tingling in feet, eyes are dry, prod cough with clear sputum, sinus pressure/congestion, and sweats.  states had 4 tick bites this past week.Victor Garrett  History of Present Illness: 60 y/o WM here with known history of OSA, GERD, chronic pancreatitis-fb GI-Buccini, recurrent ABD pain-s/p Whipple.   10/09-- follow up visit and CPX... he has multiple medical problems as noted below... he is followed regularly by DrBuccini for GI...  November 26, 2008--Presents for acute office visit. 10 days ago, family w/ URI/GI bug, Then he develop cough, congestion, aches, this started improving, over last 3 days, nausea, bloated, gas, back pain, and last pm mid abdominal pains, reflux into throat. No vomitting, bloody stools, rash, chest pain, dyspnea, no change in stools, urinary symptoms, joint swelling. Good appetite. Mild urinay freq. over last week.  Feb 11, 2009--Presents for an acute office visit. Complains of  prod cough with brown sputum, head congestion/pressure x12days - states coughs more late in the evening, and has sweats on occasion. Not getting better. Getting run down. Denies chest pain, dyspnea, orthopnea, hemoptysis, fever, n/v/d, edema, headache. --Tx w/ 3 d Zpack.   Feb 18, 2009--Returns w/ persistent symptoms of cough, congestion, nasal drainage, sinus pressure wheezing. finished z-pak, still having sinus pressure, weakness/fatigue, dry cough with chest congestion, achy joints, heaviness in chest, increased SOB. Some better -"not much" though.    March 03, 2009--Last viist. Rx Prednisone taper and doxycycline x 7 days. Had recent tick bite.  Now returns w/ persistent multiple somatic complaints. c/o of weakness, joint achiness/tingling in feet, eyes are dry,  prod cough with clear sputum, sinus pressure/congestion, sweats.  states had 4 tick bites this past week. cough is better but not completely gone, no rash, n/v/d, bloody stools, urinary  symptoms, chest pain, or dyspnea. CXR last visit no acute changes.   Medications Prior to Update: 1)  Lipram 4500 20-4.5-25 Mu  Cpep (Amylase-Lipase-Protease) .... 2 Tabs Before Dinner 2)  Prednisone 10 Mg Tabs (Prednisone) .... 4 Tabs For 2 Days, Then 3 Tabs For 2 Days, 2 Tabs For 2 Days, Then 1 Tab For 2 Days, Then Stop 3)  Doxycycline Hyclate 100 Mg Caps (Doxycycline Hyclate) .Victor Garrett.. 1 By Mouth Two Times A Day  Current Medications (verified): 1)  Lipram 4500 20-4.5-25 Mu  Cpep (Amylase-Lipase-Protease) .... 2 Tabs Before Dinner  Allergies (verified): 1)  ! Penicillin V Potassium (Penicillin V Potassium)  Past History:  Past Medical History: Last updated: 02/11/2009 SLEEP APNEA, MILD (ICD-780.57) - sleep study 1998 w/ RDI 14 & lowest sat 82%;  trial CPAP dc'd by pt; seen by DrClance & DrShoemaker;  sx improved w/ wt loss & currently denies sleep disordered breathing, no snoring, no daytime symptoms, etc...  PAROXYSMAL ATRIAL FIBRILLATION (ICD-427.31) - Hx recurrent PAF transiently after surg x2;  resolved w/ CCB Rx;  baseline EKG w/ NSR, WNL;  2DEcho w/ norm LVF, trace MR/ TR;  Cardiolite neg w/ EF= 62%...  LOW HDL (ICD-272.5) - he's been off his diet w/ weight up to 201#.Victor Garrett.  ~  last FLP 7/07 showed TChol 114, TG 92, HDL 29, LDL 67...  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 he required transient med Rx in 2005... diet controlled since then...  ~  labs 7/07 showed BS= 113, HgA1c= 5.7  ABDOMINAL PAIN, RECURRENT (ICD-789.00) - long complex hx starting in 2000 w/ abd pain, CBD stricture  w/ [facility] procedure at [facility] in [facility];  subseq perf jejunal anastomotic ulcer w/ surg in [place] in 2005;  chr pancreatitis w/ recurrent abd pain & elevated LFT's (hosp in 2008 by GI);  gallstones w/ neg cholangitis work up - all followed by DrBuccini.  GASTRITIS, ACUTE (ICD-535.00) - last EGD 2/06 by DrBuccini showed hemorrhagic  gastritis... Hx of ULCER, ACUTE GASTROJEJUNAL W/PERF W/O OBST (ICD-534.10) - EGD in 2004 showed a perforated jejunal ulcer that was oversewn, and a patent gastrojejunostomy... 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 PANCREATITIS, CHRONIC (ICD-577.1) - on CREON-20 caps 2 Tid w/ meals... DIVERTICULOSIS, ASYMPTOMATIC (ICD-562.10) - colonoscopies 11/01 & 2/06 by DrBuccini w/o other abnormalities seen... IRRITABLE BOWEL SYNDROME (ICD-564.1)  Hx of LIVER FUNCTION TESTS, ABNORMAL (ICD-794.8) -   ~  labs 9/07 showed AlkPhos 84, SGOT 43, SGPT 75...     Past Surgical History: Last updated: 08-15-2008 S/P Whipple procedure by DrPappas ([facility]) 2000 - benign stricture in common bile duct, path showed fibrosis & inflammation. S/P Elap w/ suture of perf jejunal ulcer & drainage of lesser sac abscess - 11/04 in [place]. S/P f/u ERCP at [facility] 7/05 by DrBranch w/ sludge vs stones near the junction of the CBD & cystic duct, no stones in the GB.  Family History: Last updated: 2008-08-15 mother deceased age 3 from brain cancer  hx of emphysema,asthma father deceased age 61 from old age 83 sibling alive age 61  Social History: Last updated: Aug 15, 2008 non smoker exercise= walks some no caffeine use no etoh married children  Risk Factors: Smoking Status: never (08/15/08)  Review of Systems      See HPI  Vital Signs:  Patient profile:   60 year old male Height:      69.5 inches Weight:      203.25 pounds BMI:     29.69 O2 Sat:      97 % on Room air Temp:     97.3 degrees F oral Pulse rate:   76 / minute BP sitting:   100 / 74  (left arm) Cuff size:   regular  Vitals Entered By: Boone Master CNA (March 03, 2009 11:24 AM)  O2 Flow:  Room air CC: weakness, joint achiness/tingling in feet, eyes are dry,  prod cough with clear sputum, sinus pressure/congestion, sweats.  states had 4 tick bites this past week. Is Patient Diabetic? No CBG  Result 164 Comments Medications reviewed with patient Boone Master CNA  March 03, 2009 11:23 AM   Daytime contact number verified with patient. Boone Master CNA  March 03, 2009 11:23 AM    Physical Exam  Additional Exam:  WD, WN, 60 y/o WM in NAD... GENERAL:  Alert & oriented; pleasant & cooperative... HEENT:  [place]/AT,  , EACs-clear, TMs-wnl, NOSE-pale, clear discharge. , THROAT-clear & wnl. NECK:  Supple w/ fairROM; no JVD; normal carotid impulses w/o bruits; no thyromegaly or nodules palpated; no lymphadenopathy. CHEST:Coarse BS w/ no wheeizng.  HEART:  Regular Rhythm; without murmurs/ rubs/ or gallops detected... ABDOMEN:  Soft & nontender; mult surg scars; normal bowel sounds; no organomegaly or masses palpated  EXT: without deformities, mild arthritic changes; no varicose veins/ venous insuffic/ or edema. DERM: no rash.      Impression & Recommendations:  Problem # 1:  BRONCHITIS, ACUTE (ICD-466.0)  Resolving. May use mucinex dm two times a day as needed   The following medications were removed from the medication list:  Doxycycline Hyclate 100 Mg Caps (Doxycycline hyclate) .Victor Garrett... 1 by mouth two times a day  Orders: Est. Patient Level IV (16109)  Problem # 2:  GLUCOSE INTOLERANCE (ICD-271.3) Will check A1C today to check for DM.  Orders: TLB-A1C / Hgb A1C (Glycohemoglobin) (83036-A1C) Est. Patient Level IV (60454)  Problem # 3:  FATIGUE (ICD-780.79)  Multiple somatic comlplaints  will chekc labs to r/o anemia, electrolye imbalance, psa, and urine .   Orders: Est. Patient Level IV (09811)  Other Orders: T-Urine Culture (Spectrum Order) [id number]) Fingerstick [phone]) TLB-BMP (Basic Metabolic Panel-BMET) (80048-METABOL) TLB-CBC Platelet - w/Differential (85025-CBCD) TLB-Hepatic/Liver Function Pnl (80076-HEPATIC) TLB-B12 + Folate Pnl ([id number]/XLK) TLB-PSA (Prostate Specific Antigen) (84153-PSA) TLB-Udip w/ Micro (81001-URINE)  Patient Instructions:  1)  I will call w/ labs results 2)  Increase fluids.  3)  Saline nasal rinses as needed  4)  Zyrtec 10mg  at bedtime as needed drainage.  5)  Please contact office for sooner follow up if symptoms do not improve or worsen  6)  follow up Dr. Kriste Basque in 2 weeks.

## 2010-11-01 NOTE — Progress Notes (Signed)
Summary: 2nd opinion to GI  Phone Note Call from Patient Call back at Home Phone [phone]   Caller: Patient Call For: nadel Reason for Call: Talk to Nurse Summary of Call: bcbs - [phone]  -  Need a prior auth for pt to go to [facility] for eval of GI surgery.  This is for a 2nd opinion only. Initial call taken by: Eugene Gavia,  Feb 15, 2010 2:05 PM  Follow-up for Phone Call        called and spoke with pt.  pt states he called the [facility] clinic himself and scheduled a GI consult there next Tuesday Feb 22 2010 for a second opinion.  Pt was seen by [facility] and was scheduled to have surgery to reconstruct common bile duct.  pt states he wants second opinion first before going ahead with this surgery.  Pt states he needs [place] to call [employer] for a prior auth to get appt approved.  Will forward message to SN to address.  Arman Filter LPN  Feb 15, 2010 2:38 PM   Additional Follow-up for Phone Call Additional follow up Details #1::        ok per SN for 2nd opinion. Will send order to [facility].  Arman Filter LPN  Feb 15, 2010 3:41 PM

## 2010-11-01 NOTE — Medication Information (Signed)
Summary: ccr/jss  Anticoagulant Therapy  Managed by: Eda Keys, PharmD Referring MD: Alroy Dust MD PCP: Alroy Dust Supervising MD: Eden Emms MD, Theron Arista Indication 1: Atrial Fibrillation Lab Used: [facility] Site: [facility] INR POC 1.5 INR RANGE 2.0-3.0  Dietary changes: no    Health status changes: no    Bleeding/hemorrhagic complications: no    Recent/future hospitalizations: yes       Details: surgery at [facility] on Nov 12th  Any changes in medication regimen? yes       Details: Pt has now completed cipro and flagyl abx  Recent/future dental: no  Any missed doses?: no       Is patient compliant with meds? yes       Current Medications (verified): 1)  Janumet 50-500 Mg Tabs (Sitagliptin-Metformin Hcl) .... Take 1 Tablet By Mouth Two Times A Day With Food 2)  Protonix 40 Mg Tbec (Pantoprazole Sodium) .... Take 1 Tablet By Mouth Once A Day 3)  Lipram 4500 20-4.5-25 Mu  Cpep (Amylase-Lipase-Protease) .... 2 Tabs Before Dinner 4)  Warfarin Sodium 5 Mg Tabs (Warfarin Sodium) .... Take As Directed By Coumadin Clinic. 5)  Cardizem Cd 180 Mg Xr24h-Cap (Diltiazem Hcl Coated Beads) .... Take 1 Tablet By Mouth Once A Day  Allergies (verified): 1)  ! Penicillin V Potassium (Penicillin V Potassium)  Anticoagulation Management History:      The patient is taking warfarin and comes in today for a routine follow up visit.  Positive risk factors for bleeding include presence of serious comorbidities.  Negative risk factors for bleeding include an age less than 61 years old.  The bleeding index is 'intermediate risk'.  Positive CHADS2 values include History of Diabetes.  Negative CHADS2 values include Age > 3 years old.  Anticoagulation responsible provider: Eden Emms MD, Theron Arista.  INR POC: 1.5.  Cuvette Lot#: [id number].  Exp: 08/2010.    Anticoagulation Management Assessment/Plan:      The patient's current anticoagulation dose is Warfarin sodium 5 mg tabs: Take as directed by coumadin  clinic..  The target INR is 2.0-3.0.  The next INR is due 06/25/2009.  Anticoagulation instructions were given to patient.  Results were reviewed/authorized by Eda Keys, PharmD.  He was notified by Eda Keys.         Prior Anticoagulation Instructions: INR 2.3 at goal 2-3  Continue Coumadin 5mg   = 1 tab each day  Current Anticoagulation Instructions: INR 1.5  Take 1.5 tablets (7.5 mg) on Monday and Friday and take 1 tablet (5 mg) all other days.

## 2010-11-01 NOTE — Assessment & Plan Note (Signed)
Summary: elavated blood sugars/cb   CC:  8 month ROV & review of mult medical problems....  History of Present Illness: 60 y/o WM here for a follow up visit... he has multiple medical problems as noted below... he is also followed regularly by DrBuccini for GI...   ~  March 09, 2009:  Victor Garrett has had a stressful time since our last visit... he & wife separated for awhile last fall- now back together... his BS has elevated and A1c was up to 8.0 recently requiring the initiation of DM meds... recent URI & tick exposure... ** we discussed all these issues in detail today.    Current Problem List:  SLEEP APNEA, MILD (ICD-780.57) - sleep study 1998 w/ RDI 14 & lowest sat 82%;  trial CPAP dc'd by pt; seen by DrClance & DrShoemaker;  sx improved w/ wt loss & currently denies sleep disordered breathing, no snoring, no daytime symptoms, etc...  PAROXYSMAL ATRIAL FIBRILLATION (ICD-427.31) - Hx recurrent PAF transiently after surg x2;  resolved w/ CCB Rx;  baseline EKG w/ NSR, WNL;  2DEcho w/ norm LVF, trace MR/ TR;  Cardiolite neg w/ EF= 62%...  LOW HDL (ICD-272.5) - he's been off his diet w/ weight up to 205#.Marland Kitchen.  ~  FLP 7/07 showed TChol 114, TG 92, HDL 29, LDL 67...  ~  FLP 10/09 showed TChol 165, TG 166, HDL 37, LDL 95  DIABETES MELLITUS, TYPE II (ICD-250.00) - there is a family hx of diabetes, and he had CBD stricture w/ Whipple procedure... he required transient med Rx in 2005... diet controlled since then...  ~  labs 7/07 showed BS= 113, HgA1c= 5.7  ~  labs 10/09 showed BS= 126  ~  labs 6/10 showed BS= 148, A1c= 8.0.Marland Kitchen. rec> start JANUMET 50-500 Bid...  ABDOMINAL PAIN, RECURRENT (ICD-789.00) - long complex hx starting in 2000 w/ abd pain, CBD stricture w/ Whipple procedure at [facility] in 2000;  subseq perf jejunal anastomotic ulcer w/ surg in [place] in 2005;  chr pancreatitis w/ recurrent abd pain & elevated LFT's (hosp in 2008 by GI);  gallstones w/ neg cholangitis work up - all followed by  DrBuccini.  GASTRITIS, ACUTE (ICD-535.00) - last EGD 2/06 by DrBuccini showed hemorrhagic gastritis... Hx of ULCER, ACUTE GASTROJEJUNAL W/PERF W/O OBST (ICD-534.10) - EGD in 2004 showed a perforated jejunal ulcer that was oversewn, and a patent gastrojejunostomy... 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 PANCREATITIS, CHRONIC (ICD-577.1) - on LIPRAM 2 tabs before dinner... DIVERTICULOSIS, ASYMPTOMATIC (ICD-562.10) - colonoscopies 11/01 & 2/06 by DrBuccini w/o other abnormalities seen... IRRITABLE BOWEL SYNDROME (ICD-564.1)  Hx of LIVER FUNCTION TESTS, ABNORMAL (ICD-794.8) -   ~  labs 9/07 showed AlkPhos 84, SGOT 43, SGPT 75...  ANXIETY (ICD-300.00)   Allergies: 1)  ! Penicillin V Potassium (Penicillin V Potassium)  Comments:  Nurse/Medical Assistant: The patient's medications and allergies were reviewed with the patient and were updated in the Medication and Allergy Lists.  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 S/P Elap w/ suture of perf jejunal ulcer & drainage of lesser sac abscess - 11/04 in [place]. S/P f/u [facility]CP [facility] 7/05 by DrBranch w/ sludge vs stones near the junction of the CBD & cystic duct, no stones in the GB.  Family History: Reviewed history from 07/20/2008 and no changes  required. mother deceased age 77 from brain cancer  hx of emphysema,asthma father deceased age 336 from old age 33 sibling alive age 72  Social History: Reviewed history from 07/20/2008 and no changes required. non smoker exercise= walks some no caffeine use no etoh married children  Review of Systems      See HPI  The patient denies anorexia, fever, weight loss, weight gain, vision loss, decreased hearing, hoarseness, chest pain, syncope, dyspnea on exertion, peripheral edema, prolonged cough, headaches, hemoptysis, abdominal pain, melena, hematochezia, severe indigestion/heartburn, hematuria, incontinence, muscle weakness, suspicious skin lesions, transient blindness, difficulty walking, depression, unusual weight change, abnormal bleeding, enlarged lymph nodes, and angioedema.    Vital Signs:  Patient profile:   60 year old male Height:      69.5 inches Weight:      205 pounds BMI:     29.95 O2 Sat:      98 % on Room air Temp:     98 degrees F oral Pulse rate:   93 / minute BP sitting:   110 / 70  (left arm) Cuff size:   regular  Vitals Entered By: Marijo File CMA (March 09, 2009 9:27 AM)  O2 Sat at Rest %:  98 O2 Flow:  Room air CC: 8 month ROV & review of mult medical problems... Is Patient Diabetic? Yes  Pain Assessment Patient in pain? no      Comments no changes in meds   Physical Exam  Additional Exam:  WD, WN, 60 y/o WM in NAD... GENERAL:  Alert & oriented; pleasant & cooperative... HEENT:  [place]/AT, PERRLA, EOM- full, EACs-clear, TMs-wnl, NOSE- wnl, THROAT-clear & wnl. NECK:  Supple w/ fairROM; no JVD; normal carotid impulses w/o bruits; no thyromegaly or nodules palpated; no lymphadenopathy. CHEST:  clear to P & A, no wheezing/ rales/ rhonchi... HEART:  Regular Rhythm; without murmurs/ rubs/ or gallops detected... ABDOMEN:  Soft & nontender; mult surg scars; normal bowel sounds; no organomegaly or masses palpated  EXT: without deformities, mild arthritic  changes; no varicose veins/ venous insuffic/ or edema. DERM:  no rash, no lesions...     Impression & Recommendations:  Problem # 1:  DIABETES MELLITUS, TYPE II (ICD-250.00) NEW DIAGNOSIS... he's been started on Janumet 50-500 Bid... His updated medication list for this problem includes:    Janumet 50-500 Mg Tabs (Sitagliptin-metformin hcl) .Marland Kitchen... Take 1 tablet by mouth two times a day with food  Problem # 2:  GI --- He's had signif GI problems & followed regularly by DrBucinni...  Problem # 3:  OTHER MEDICAL PROBLEMS AS LISTED---  Complete Medication List: 1)  Janumet 50-500 Mg Tabs (Sitagliptin-metformin hcl) .... Take 1 tablet by mouth two times a day with food 2)  Protonix 40 Mg Tbec (Pantoprazole sodium) .... Take 1 tablet by mouth once a day 3)  Lipram 4500 20-4.5-25 Mu Cpep (Amylase-lipase-protease) .... 2 tabs before dinner  Other Orders: Prescription Created Electronically [phone])  Patient Instructions: 1)  Today we updated your med list- see below....  2)  We refilled your JANUMET perscription.Marland KitchenMarland Kitchen 3)  We discussed diabetic diet + exercise program w/ the goal of losing 1-20 lbs over  the next 6 months... 4)  Call for any questions.Marland KitchenMarland Kitchen 5)  Please schedule a follow-up appointment in 3 months. Prescriptions: JANUMET 50-500 MG TABS (SITAGLIPTIN-METFORMIN HCL) Take 1 tablet by mouth two times a day with food  #60 x prn   Entered and Authorized by:   Michele Mcalpine MD   Signed by:   Michele Mcalpine MD on 03/09/2009   Method used:   Print then Give to Patient   RxID:   [id number]

## 2010-11-01 NOTE — Medication Information (Signed)
Summary: rov/ewj  Anticoagulant Therapy  Managed by: Cloyde Reams, RN, BSN Referring MD: Alroy Dust MD PCP: Alroy Dust Supervising MD: Jens Som MD, Arlys John Indication 1: Atrial Fibrillation Lab Used: [facility] Site: [facility] INR POC 2.5 INR RANGE 2.0-3.0  Dietary changes: no    Health status changes: no    Bleeding/hemorrhagic complications: no    Recent/future hospitalizations: no    Any changes in medication regimen? no    Recent/future dental: no  Any missed doses?: no       Is patient compliant with meds? yes      Comments: Pt has opted not to have modified whipple surgery at this time.    Current Medications (verified): 1)  Warfarin Sodium 5 Mg Tabs (Warfarin Sodium) .... Take As Directed By Coumadin Clinic. 2)  Cardizem Cd 180 Mg Xr24h-Cap (Diltiazem Hcl Coated Beads) .... Take 1 Tablet By Mouth Once A Day 3)  Janumet 50-500 Mg Tabs (Sitagliptin-Metformin Hcl) .... Take 1 Tablet By Mouth Two Times A Day With Food 4)  Protonix 40 Mg Tbec (Pantoprazole Sodium) .... Take 1 Tablet By Mouth Once A Day 5)  Lipram 4500 20-4.5-25 Mu  Cpep (Amylase-Lipase-Protease) .... 2 Tabs Before Dinner 6)  Lexapro 10 Mg Tabs (Escitalopram Oxalate) .... Take 1 Tab By Mouth Once Daily As Directed...  Allergies (verified): 1)  ! Penicillin V Potassium (Penicillin V Potassium)  Anticoagulation Management History:      The patient is taking warfarin and comes in today for a routine follow up visit.  Positive risk factors for bleeding include presence of serious comorbidities.  Negative risk factors for bleeding include an age less than 50 years old.  The bleeding index is 'intermediate risk'.  Positive CHADS2 values include History of Diabetes.  Negative CHADS2 values include Age > 81 years old.  Anticoagulation responsible provider: Jens Som MD, Arlys John.  INR POC: 2.5.  Cuvette Lot#: [id number].  Exp: 07/2010.    Anticoagulation Management Assessment/Plan:      The patient's current  anticoagulation dose is Warfarin sodium 5 mg tabs: Take as directed by coumadin clinic..  The target INR is 2.0-3.0.  The next INR is due 08/30/2009.  Anticoagulation instructions were given to patient.  Results were reviewed/authorized by Cloyde Reams, RN, BSN.  He was notified by Cloyde Reams RN.         Prior Anticoagulation Instructions: INR 3.9  Hold today's dose of coumadin.  Then start on 1 tablet daily except 1.5 tablets on Mondays and Fridays.  Recheck in 2 weeks.    Current Anticoagulation Instructions: INR 2.5  Continue on same dosage 1 tablet daily except 1.5 tablets on Mondays and Fridays.  Recheck in 3 weeks.

## 2010-11-07 ENCOUNTER — Encounter: Payer: Self-pay | Admitting: Pulmonary Disease

## 2010-11-16 IMAGING — CR DG CHEST 1V PORT
1 series · 1 of 1 positions shown · non-contrast
Comparison: 02/18/2009

CLINICAL DATA: Abdominal pain and shortness of breath.

PORTABLE CHEST - 1 VIEW

[view not recorded]
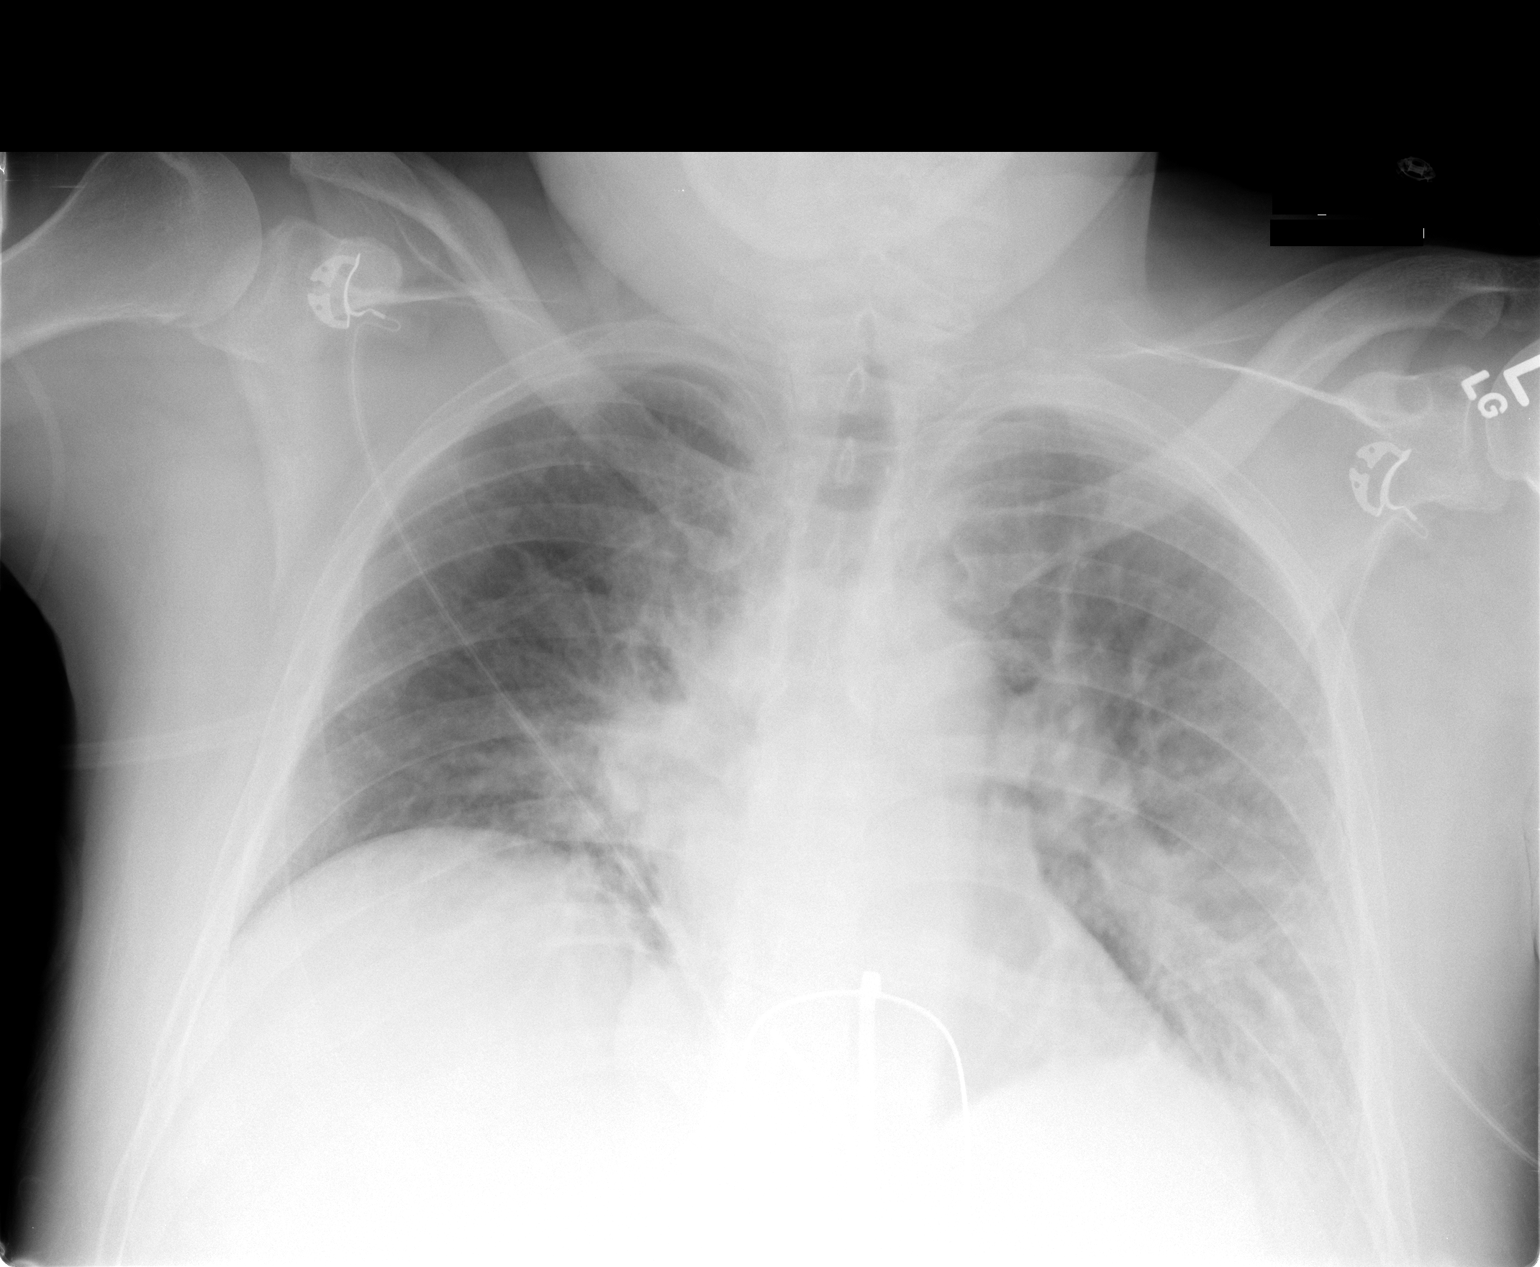

[1 of 1 positions shown; findings below may reference images not displayed]

FINDINGS: There is evidence of pulmonary edema and low lung
volumes.  No focal infiltrate or evidence of significant pleural
fluid.  Heart size is normal.
IMPRESSION: Pulmonary edema.

## 2010-11-19 IMAGING — CR DG CHEST 1V PORT
1 series · 1 of 1 positions shown · non-contrast
Comparison: 05/26/2009

CLINICAL DATA: Abdominal pain

PORTABLE CHEST - 1 VIEW

[view not recorded]
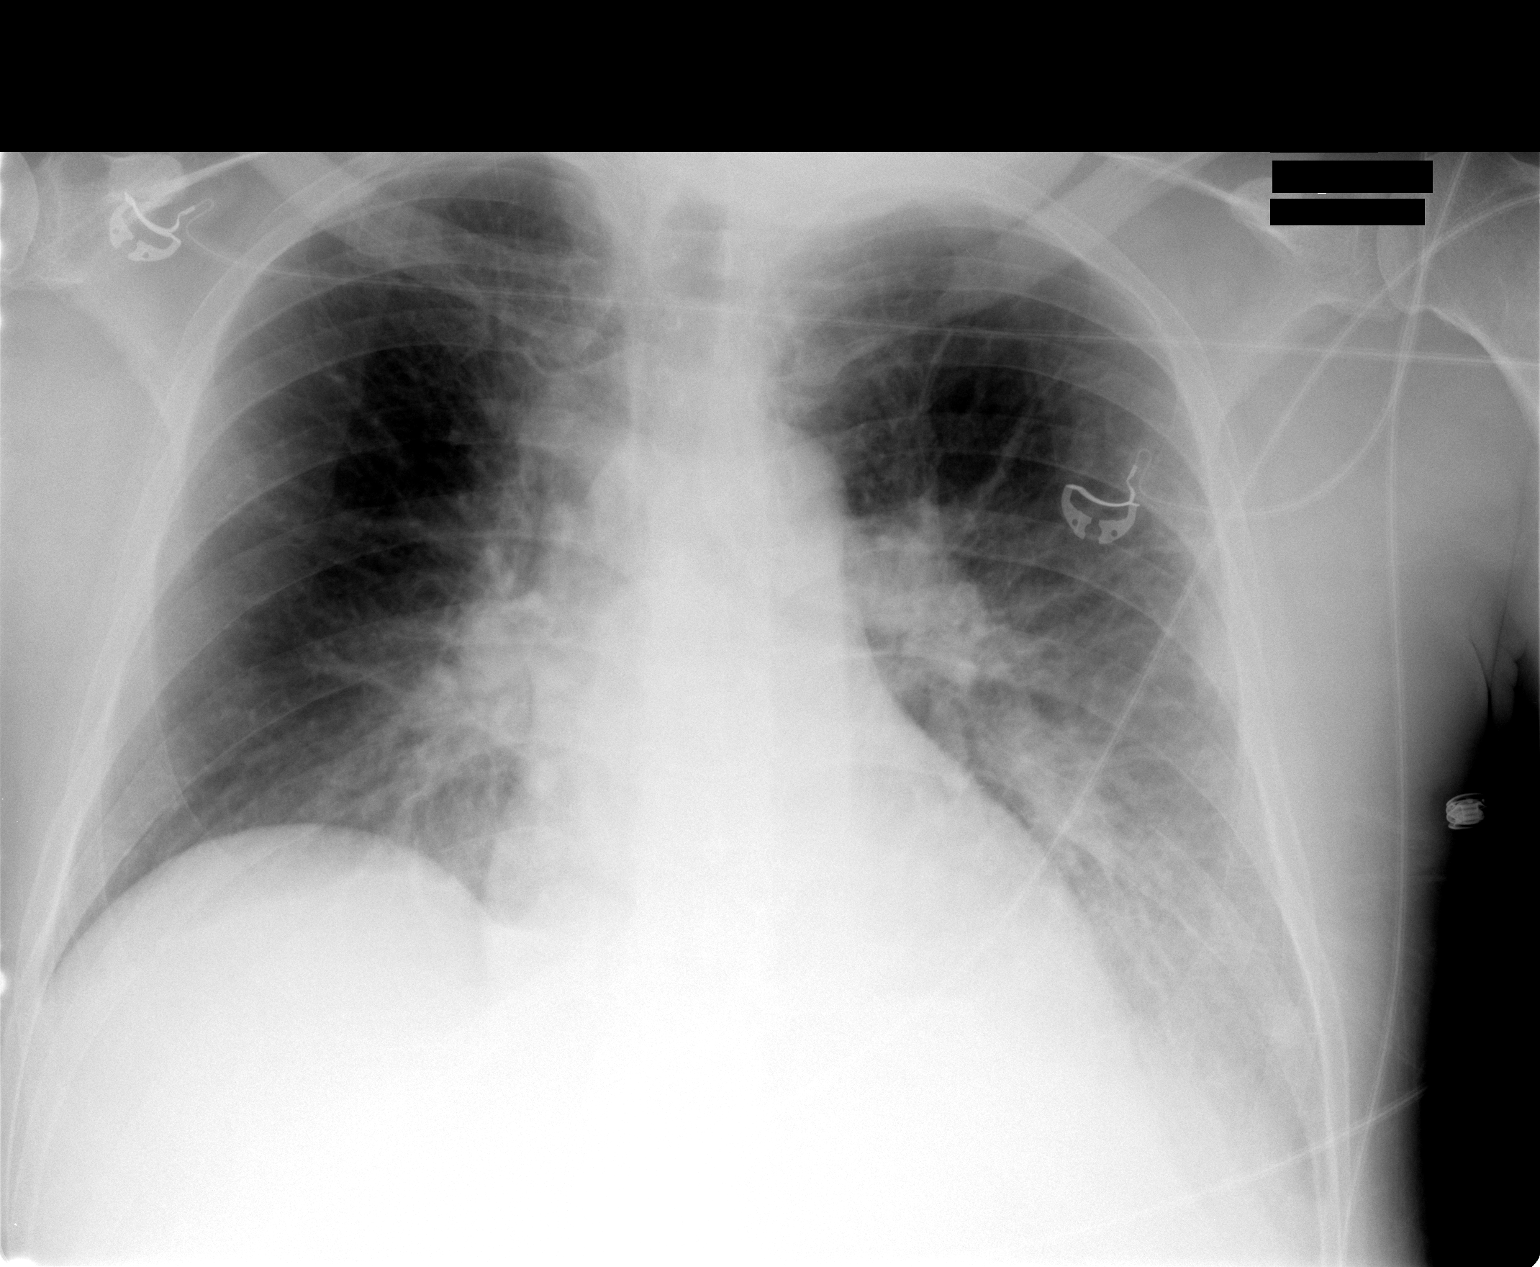

[1 of 1 positions shown; findings below may reference images not displayed]

FINDINGS: There is a half of the humeri wall and bilateral
perihilar and lower lobe airspace opacities are again noted as
heart is borderline in size.  Mild vascular congestion.  No real
change since prior study.
IMPRESSION: No interval change.

## 2010-11-23 NOTE — Letter (Signed)
[facility]: [facility]   Imported By: Sherian Rein 11/14/2010 14:04:39  _____________________________________________________________________  External Attachment:    Type:   Image     Comment:   External Document

## 2011-01-07 LAB — CBC
HCT: 34.2 % — ABNORMAL LOW (ref 39.0–52.0)
HCT: 34.5 % — ABNORMAL LOW (ref 39.0–52.0)
HCT: 34.9 % — ABNORMAL LOW (ref 39.0–52.0)
HCT: 35.5 % — ABNORMAL LOW (ref 39.0–52.0)
HCT: 35.9 % — ABNORMAL LOW (ref 39.0–52.0)
HCT: 36 % — ABNORMAL LOW (ref 39.0–52.0)
HCT: 36 % — ABNORMAL LOW (ref 39.0–52.0)
HCT: 36.7 % — ABNORMAL LOW (ref 39.0–52.0)
HCT: 37.7 % — ABNORMAL LOW (ref 39.0–52.0)
HCT: 38.7 % — ABNORMAL LOW (ref 39.0–52.0)
HCT: 39.2 % (ref 39.0–52.0)
HCT: 44.5 % (ref 39.0–52.0)
Hemoglobin: 11.4 g/dL — ABNORMAL LOW (ref 13.0–17.0)
Hemoglobin: 11.7 g/dL — ABNORMAL LOW (ref 13.0–17.0)
Hemoglobin: 11.8 g/dL — ABNORMAL LOW (ref 13.0–17.0)
Hemoglobin: 11.8 g/dL — ABNORMAL LOW (ref 13.0–17.0)
Hemoglobin: 12 g/dL — ABNORMAL LOW (ref 13.0–17.0)
Hemoglobin: 12 g/dL — ABNORMAL LOW (ref 13.0–17.0)
Hemoglobin: 12 g/dL — ABNORMAL LOW (ref 13.0–17.0)
Hemoglobin: 12.3 g/dL — ABNORMAL LOW (ref 13.0–17.0)
Hemoglobin: 12.9 g/dL — ABNORMAL LOW (ref 13.0–17.0)
Hemoglobin: 12.9 g/dL — ABNORMAL LOW (ref 13.0–17.0)
Hemoglobin: 13.1 g/dL (ref 13.0–17.0)
Hemoglobin: 15.5 g/dL (ref 13.0–17.0)
MCHC: 32.8 g/dL (ref 30.0–36.0)
MCHC: 33.3 g/dL (ref 30.0–36.0)
MCHC: 33.4 g/dL (ref 30.0–36.0)
MCHC: 33.4 g/dL (ref 30.0–36.0)
MCHC: 33.5 g/dL (ref 30.0–36.0)
MCHC: 33.5 g/dL (ref 30.0–36.0)
MCHC: 33.5 g/dL (ref 30.0–36.0)
MCHC: 33.8 g/dL (ref 30.0–36.0)
MCHC: 33.8 g/dL (ref 30.0–36.0)
MCHC: 33.9 g/dL (ref 30.0–36.0)
MCHC: 34.1 g/dL (ref 30.0–36.0)
MCHC: 34.8 g/dL (ref 30.0–36.0)
MCV: 84.7 fL (ref 78.0–100.0)
MCV: 84.8 fL (ref 78.0–100.0)
MCV: 85.2 fL (ref 78.0–100.0)
MCV: 85.5 fL (ref 78.0–100.0)
MCV: 85.6 fL (ref 78.0–100.0)
MCV: 85.6 fL (ref 78.0–100.0)
MCV: 85.7 fL (ref 78.0–100.0)
MCV: 85.9 fL (ref 78.0–100.0)
MCV: 86.2 fL (ref 78.0–100.0)
MCV: 86.5 fL (ref 78.0–100.0)
MCV: 86.8 fL (ref 78.0–100.0)
MCV: 86.8 fL (ref 78.0–100.0)
Platelets: 129 10*3/uL — ABNORMAL LOW (ref 150–400)
Platelets: 139 10*3/uL — ABNORMAL LOW (ref 150–400)
Platelets: 140 10*3/uL — ABNORMAL LOW (ref 150–400)
Platelets: 143 10*3/uL — ABNORMAL LOW (ref 150–400)
Platelets: 144 10*3/uL — ABNORMAL LOW (ref 150–400)
Platelets: 147 10*3/uL — ABNORMAL LOW (ref 150–400)
Platelets: 176 10*3/uL (ref 150–400)
Platelets: 182 10*3/uL (ref 150–400)
Platelets: 187 10*3/uL (ref 150–400)
Platelets: 191 10*3/uL (ref 150–400)
Platelets: 201 10*3/uL (ref 150–400)
Platelets: 201 10*3/uL (ref 150–400)
RBC: 4 MIL/uL — ABNORMAL LOW (ref 4.22–5.81)
RBC: 4.01 MIL/uL — ABNORMAL LOW (ref 4.22–5.81)
RBC: 4.07 MIL/uL — ABNORMAL LOW (ref 4.22–5.81)
RBC: 4.15 MIL/uL — ABNORMAL LOW (ref 4.22–5.81)
RBC: 4.16 MIL/uL — ABNORMAL LOW (ref 4.22–5.81)
RBC: 4.18 MIL/uL — ABNORMAL LOW (ref 4.22–5.81)
RBC: 4.18 MIL/uL — ABNORMAL LOW (ref 4.22–5.81)
RBC: 4.23 MIL/uL (ref 4.22–5.81)
RBC: 4.43 MIL/uL (ref 4.22–5.81)
RBC: 4.52 MIL/uL (ref 4.22–5.81)
RBC: 4.58 MIL/uL (ref 4.22–5.81)
RBC: 5.26 MIL/uL (ref 4.22–5.81)
RDW: 13.9 % (ref 11.5–15.5)
RDW: 13.9 % (ref 11.5–15.5)
RDW: 14.3 % (ref 11.5–15.5)
RDW: 14.3 % (ref 11.5–15.5)
RDW: 14.4 % (ref 11.5–15.5)
RDW: 14.5 % (ref 11.5–15.5)
RDW: 14.5 % (ref 11.5–15.5)
RDW: 14.6 % (ref 11.5–15.5)
RDW: 14.6 % (ref 11.5–15.5)
RDW: 14.7 % (ref 11.5–15.5)
RDW: 14.8 % (ref 11.5–15.5)
RDW: 14.9 % (ref 11.5–15.5)
WBC: 10.6 10*3/uL — ABNORMAL HIGH (ref 4.0–10.5)
WBC: 11.5 10*3/uL — ABNORMAL HIGH (ref 4.0–10.5)
WBC: 13 10*3/uL — ABNORMAL HIGH (ref 4.0–10.5)
WBC: 13.6 10*3/uL — ABNORMAL HIGH (ref 4.0–10.5)
WBC: 14.1 10*3/uL — ABNORMAL HIGH (ref 4.0–10.5)
WBC: 15 10*3/uL — ABNORMAL HIGH (ref 4.0–10.5)
WBC: 20.6 10*3/uL — ABNORMAL HIGH (ref 4.0–10.5)
WBC: 22.8 10*3/uL — ABNORMAL HIGH (ref 4.0–10.5)
WBC: 8.2 10*3/uL (ref 4.0–10.5)
WBC: 8.4 10*3/uL (ref 4.0–10.5)
WBC: 9.3 10*3/uL (ref 4.0–10.5)
WBC: 9.4 10*3/uL (ref 4.0–10.5)

## 2011-01-07 LAB — CULTURE, BLOOD (ROUTINE X 2)
Culture: NO GROWTH
Culture: NO GROWTH

## 2011-01-07 LAB — BASIC METABOLIC PANEL
BUN: 2 mg/dL — ABNORMAL LOW (ref 6–23)
BUN: 4 mg/dL — ABNORMAL LOW (ref 6–23)
BUN: 7 mg/dL (ref 6–23)
CO2: 27 mEq/L (ref 19–32)
CO2: 28 mEq/L (ref 19–32)
CO2: 29 mEq/L (ref 19–32)
Calcium: 7.8 mg/dL — ABNORMAL LOW (ref 8.4–10.5)
Calcium: 8.1 mg/dL — ABNORMAL LOW (ref 8.4–10.5)
Calcium: 8.4 mg/dL (ref 8.4–10.5)
Chloride: 106 mEq/L (ref 96–112)
Chloride: 108 mEq/L (ref 96–112)
Chloride: 108 mEq/L (ref 96–112)
Creatinine, Ser: 0.91 mg/dL (ref 0.4–1.5)
Creatinine, Ser: 0.91 mg/dL (ref 0.4–1.5)
Creatinine, Ser: 0.92 mg/dL (ref 0.4–1.5)
GFR calc Af Amer: >60 mL/min (ref >60)
GFR calc Af Amer: >60 mL/min (ref >60)
GFR calc Af Amer: >60 mL/min (ref >60)
GFR calc non Af Amer: >60 mL/min (ref >60)
GFR calc non Af Amer: >60 mL/min (ref >60)
GFR calc non Af Amer: >60 mL/min (ref >60)
Glucose, Bld: 125 mg/dL — ABNORMAL HIGH (ref 70–99)
Glucose, Bld: 134 mg/dL — ABNORMAL HIGH (ref 70–99)
Glucose, Bld: 145 mg/dL — ABNORMAL HIGH (ref 70–99)
Potassium: 3.3 mEq/L — ABNORMAL LOW (ref 3.5–5.1)
Potassium: 3.4 mEq/L — ABNORMAL LOW (ref 3.5–5.1)
Potassium: 3.8 mEq/L (ref 3.5–5.1)
Sodium: 141 mEq/L (ref 135–145)
Sodium: 141 mEq/L (ref 135–145)
Sodium: 141 mEq/L (ref 135–145)

## 2011-01-07 LAB — GLUCOSE, CAPILLARY
Glucose-Capillary: 101 mg/dL — ABNORMAL HIGH (ref 70–99)
Glucose-Capillary: 105 mg/dL — ABNORMAL HIGH (ref 70–99)
Glucose-Capillary: 109 mg/dL — ABNORMAL HIGH (ref 70–99)
Glucose-Capillary: 119 mg/dL — ABNORMAL HIGH (ref 70–99)
Glucose-Capillary: 120 mg/dL — ABNORMAL HIGH (ref 70–99)
Glucose-Capillary: 121 mg/dL — ABNORMAL HIGH (ref 70–99)
Glucose-Capillary: 122 mg/dL — ABNORMAL HIGH (ref 70–99)
Glucose-Capillary: 126 mg/dL — ABNORMAL HIGH (ref 70–99)
Glucose-Capillary: 127 mg/dL — ABNORMAL HIGH (ref 70–99)
Glucose-Capillary: 129 mg/dL — ABNORMAL HIGH (ref 70–99)
Glucose-Capillary: 130 mg/dL — ABNORMAL HIGH (ref 70–99)
Glucose-Capillary: 135 mg/dL — ABNORMAL HIGH (ref 70–99)
Glucose-Capillary: 142 mg/dL — ABNORMAL HIGH (ref 70–99)
Glucose-Capillary: 146 mg/dL — ABNORMAL HIGH (ref 70–99)
Glucose-Capillary: 153 mg/dL — ABNORMAL HIGH (ref 70–99)
Glucose-Capillary: 154 mg/dL — ABNORMAL HIGH (ref 70–99)
Glucose-Capillary: 156 mg/dL — ABNORMAL HIGH (ref 70–99)
Glucose-Capillary: 156 mg/dL — ABNORMAL HIGH (ref 70–99)
Glucose-Capillary: 160 mg/dL — ABNORMAL HIGH (ref 70–99)
Glucose-Capillary: 162 mg/dL — ABNORMAL HIGH (ref 70–99)
Glucose-Capillary: 167 mg/dL — ABNORMAL HIGH (ref 70–99)
Glucose-Capillary: 174 mg/dL — ABNORMAL HIGH (ref 70–99)
Glucose-Capillary: 175 mg/dL — ABNORMAL HIGH (ref 70–99)
Glucose-Capillary: 176 mg/dL — ABNORMAL HIGH (ref 70–99)
Glucose-Capillary: 176 mg/dL — ABNORMAL HIGH (ref 70–99)
Glucose-Capillary: 183 mg/dL — ABNORMAL HIGH (ref 70–99)
Glucose-Capillary: 187 mg/dL — ABNORMAL HIGH (ref 70–99)
Glucose-Capillary: 189 mg/dL — ABNORMAL HIGH (ref 70–99)
Glucose-Capillary: 194 mg/dL — ABNORMAL HIGH (ref 70–99)
Glucose-Capillary: 206 mg/dL — ABNORMAL HIGH (ref 70–99)
Glucose-Capillary: 220 mg/dL — ABNORMAL HIGH (ref 70–99)
Glucose-Capillary: 253 mg/dL — ABNORMAL HIGH (ref 70–99)
Glucose-Capillary: 83 mg/dL (ref 70–99)

## 2011-01-07 LAB — HEPATITIS PANEL, ACUTE
HCV Ab: NEGATIVE
Hep A IgM: NEGATIVE
Hep B C IgM: NEGATIVE
Hepatitis B Surface Ag: NEGATIVE

## 2011-01-07 LAB — DIFFERENTIAL
Basophils Absolute: 0 10*3/uL (ref 0.0–0.1)
Basophils Absolute: 0 10*3/uL (ref 0.0–0.1)
Basophils Relative: 0 % (ref 0–1)
Basophils Relative: 0 % (ref 0–1)
Eosinophils Absolute: 0.1 10*3/uL (ref 0.0–0.7)
Eosinophils Absolute: 0.1 10*3/uL (ref 0.0–0.7)
Eosinophils Relative: 0 % (ref 0–5)
Eosinophils Relative: 1 % (ref 0–5)
Lymphocytes Relative: 4 % — ABNORMAL LOW (ref 12–46)
Lymphocytes Relative: 8 % — ABNORMAL LOW (ref 12–46)
Lymphs Abs: 0.6 10*3/uL — ABNORMAL LOW (ref 0.7–4.0)
Lymphs Abs: 1.1 10*3/uL (ref 0.7–4.0)
Monocytes Absolute: 0.6 10*3/uL (ref 0.1–1.0)
Monocytes Absolute: 0.7 10*3/uL (ref 0.1–1.0)
Monocytes Relative: 4 % (ref 3–12)
Monocytes Relative: 5 % (ref 3–12)
Neutro Abs: 11.7 10*3/uL — ABNORMAL HIGH (ref 1.7–7.7)
Neutro Abs: 12.8 10*3/uL — ABNORMAL HIGH (ref 1.7–7.7)
Neutrophils Relative %: 86 % — ABNORMAL HIGH (ref 43–77)
Neutrophils Relative %: 91 % — ABNORMAL HIGH (ref 43–77)

## 2011-01-07 LAB — HEPATIC FUNCTION PANEL
ALT: 185 U/L — ABNORMAL HIGH (ref 0–53)
ALT: 80 U/L — ABNORMAL HIGH (ref 0–53)
AST: 38 U/L — ABNORMAL HIGH (ref 0–37)
AST: 40 U/L — ABNORMAL HIGH (ref 0–37)
Albumin: 2.5 g/dL — ABNORMAL LOW (ref 3.5–5.2)
Albumin: 2.7 g/dL — ABNORMAL LOW (ref 3.5–5.2)
Alkaline Phosphatase: 109 U/L (ref 39–117)
Alkaline Phosphatase: 96 U/L (ref 39–117)
Bilirubin, Direct: 0.4 mg/dL — ABNORMAL HIGH (ref 0.0–0.3)
Bilirubin, Direct: 0.7 mg/dL — ABNORMAL HIGH (ref 0.0–0.3)
Indirect Bilirubin: 0.6 mg/dL (ref 0.3–0.9)
Indirect Bilirubin: 0.9 mg/dL (ref 0.3–0.9)
Total Bilirubin: 1 mg/dL (ref 0.3–1.2)
Total Bilirubin: 1.6 mg/dL — ABNORMAL HIGH (ref 0.3–1.2)
Total Protein: 4.7 g/dL — ABNORMAL LOW (ref 6.0–8.3)
Total Protein: 5.1 g/dL — ABNORMAL LOW (ref 6.0–8.3)

## 2011-01-07 LAB — COMPREHENSIVE METABOLIC PANEL
ALT: 142 U/L — ABNORMAL HIGH (ref 0–53)
ALT: 279 U/L — ABNORMAL HIGH (ref 0–53)
ALT: 450 U/L — ABNORMAL HIGH (ref 0–53)
ALT: 605 U/L — ABNORMAL HIGH (ref 0–53)
ALT: 659 U/L — ABNORMAL HIGH (ref 0–53)
AST: 247 U/L — ABNORMAL HIGH (ref 0–37)
AST: 36 U/L (ref 0–37)
AST: 673 U/L — ABNORMAL HIGH (ref 0–37)
AST: 88 U/L — ABNORMAL HIGH (ref 0–37)
AST: 885 U/L — ABNORMAL HIGH (ref 0–37)
Albumin: 2.5 g/dL — ABNORMAL LOW (ref 3.5–5.2)
Albumin: 2.7 g/dL — ABNORMAL LOW (ref 3.5–5.2)
Albumin: 2.8 g/dL — ABNORMAL LOW (ref 3.5–5.2)
Albumin: 3 g/dL — ABNORMAL LOW (ref 3.5–5.2)
Albumin: 4.2 g/dL (ref 3.5–5.2)
Alkaline Phosphatase: 106 U/L (ref 39–117)
Alkaline Phosphatase: 116 U/L (ref 39–117)
Alkaline Phosphatase: 125 U/L — ABNORMAL HIGH (ref 39–117)
Alkaline Phosphatase: 154 U/L — ABNORMAL HIGH (ref 39–117)
Alkaline Phosphatase: 189 U/L — ABNORMAL HIGH (ref 39–117)
BUN: 10 mg/dL (ref 6–23)
BUN: 4 mg/dL — ABNORMAL LOW (ref 6–23)
BUN: 5 mg/dL — ABNORMAL LOW (ref 6–23)
BUN: 7 mg/dL (ref 6–23)
BUN: 9 mg/dL (ref 6–23)
CO2: 23 mEq/L (ref 19–32)
CO2: 25 mEq/L (ref 19–32)
CO2: 26 mEq/L (ref 19–32)
CO2: 27 mEq/L (ref 19–32)
CO2: 28 mEq/L (ref 19–32)
Calcium: 7.4 mg/dL — ABNORMAL LOW (ref 8.4–10.5)
Calcium: 7.6 mg/dL — ABNORMAL LOW (ref 8.4–10.5)
Calcium: 7.8 mg/dL — ABNORMAL LOW (ref 8.4–10.5)
Calcium: 8.1 mg/dL — ABNORMAL LOW (ref 8.4–10.5)
Calcium: 9.1 mg/dL (ref 8.4–10.5)
Chloride: 104 mEq/L (ref 96–112)
Chloride: 105 mEq/L (ref 96–112)
Chloride: 109 mEq/L (ref 96–112)
Chloride: 111 mEq/L (ref 96–112)
Chloride: 99 mEq/L (ref 96–112)
Creatinine, Ser: 0.86 mg/dL (ref 0.4–1.5)
Creatinine, Ser: 0.91 mg/dL (ref 0.4–1.5)
Creatinine, Ser: 0.92 mg/dL (ref 0.4–1.5)
Creatinine, Ser: 0.94 mg/dL (ref 0.4–1.5)
Creatinine, Ser: 0.94 mg/dL (ref 0.4–1.5)
GFR calc Af Amer: >60 mL/min (ref >60)
GFR calc Af Amer: >60 mL/min (ref >60)
GFR calc Af Amer: >60 mL/min (ref >60)
GFR calc Af Amer: >60 mL/min (ref >60)
GFR calc Af Amer: >60 mL/min (ref >60)
GFR calc non Af Amer: >60 mL/min (ref >60)
GFR calc non Af Amer: >60 mL/min (ref >60)
GFR calc non Af Amer: >60 mL/min (ref >60)
GFR calc non Af Amer: >60 mL/min (ref >60)
GFR calc non Af Amer: >60 mL/min (ref >60)
Glucose, Bld: 137 mg/dL — ABNORMAL HIGH (ref 70–99)
Glucose, Bld: 146 mg/dL — ABNORMAL HIGH (ref 70–99)
Glucose, Bld: 162 mg/dL — ABNORMAL HIGH (ref 70–99)
Glucose, Bld: 178 mg/dL — ABNORMAL HIGH (ref 70–99)
Glucose, Bld: 178 mg/dL — ABNORMAL HIGH (ref 70–99)
Potassium: 3.2 mEq/L — ABNORMAL LOW (ref 3.5–5.1)
Potassium: 3.4 mEq/L — ABNORMAL LOW (ref 3.5–5.1)
Potassium: 3.7 mEq/L (ref 3.5–5.1)
Potassium: 3.8 mEq/L (ref 3.5–5.1)
Potassium: 3.9 mEq/L (ref 3.5–5.1)
Sodium: 135 mEq/L (ref 135–145)
Sodium: 136 mEq/L (ref 135–145)
Sodium: 141 mEq/L (ref 135–145)
Sodium: 141 mEq/L (ref 135–145)
Sodium: 141 mEq/L (ref 135–145)
Total Bilirubin: 1.4 mg/dL — ABNORMAL HIGH (ref 0.3–1.2)
Total Bilirubin: 1.5 mg/dL — ABNORMAL HIGH (ref 0.3–1.2)
Total Bilirubin: 2.6 mg/dL — ABNORMAL HIGH (ref 0.3–1.2)
Total Bilirubin: 3.8 mg/dL — ABNORMAL HIGH (ref 0.3–1.2)
Total Bilirubin: 4.1 mg/dL — ABNORMAL HIGH (ref 0.3–1.2)
Total Protein: 4.8 g/dL — ABNORMAL LOW (ref 6.0–8.3)
Total Protein: 5.1 g/dL — ABNORMAL LOW (ref 6.0–8.3)
Total Protein: 5.1 g/dL — ABNORMAL LOW (ref 6.0–8.3)
Total Protein: 5.4 g/dL — ABNORMAL LOW (ref 6.0–8.3)
Total Protein: 7.2 g/dL (ref 6.0–8.3)

## 2011-01-07 LAB — URINE CULTURE
Colony Count: NO GROWTH
Culture: NO GROWTH

## 2011-01-07 LAB — HEPARIN LEVEL (UNFRACTIONATED)
Heparin Unfractionated: 0.19 IU/mL — ABNORMAL LOW (ref 0.30–0.70)
Heparin Unfractionated: 0.22 IU/mL — ABNORMAL LOW (ref 0.30–0.70)
Heparin Unfractionated: 0.25 IU/mL — ABNORMAL LOW (ref 0.30–0.70)
Heparin Unfractionated: 0.31 IU/mL (ref 0.30–0.70)
Heparin Unfractionated: 0.32 IU/mL (ref 0.30–0.70)

## 2011-01-07 LAB — POCT CARDIAC MARKERS
CKMB, poc: <1 ng/mL — ABNORMAL LOW (ref 1.0–8.0)
CKMB, poc: <1 ng/mL — ABNORMAL LOW (ref 1.0–8.0)
CKMB, poc: <1 ng/mL — ABNORMAL LOW (ref 1.0–8.0)
Myoglobin, poc: 43.2 ng/mL (ref 12–200)
Myoglobin, poc: 67.6 ng/mL (ref 12–200)
Myoglobin, poc: 72.4 ng/mL (ref 12–200)
Troponin i, poc: <0.05 ng/mL (ref 0.00–0.09)
Troponin i, poc: <0.05 ng/mL (ref 0.00–0.09)
Troponin i, poc: <0.05 ng/mL (ref 0.00–0.09)

## 2011-01-07 LAB — PROTIME-INR
INR: 1.1 (ref 0.00–1.49)
INR: 1.1 (ref 0.00–1.49)
INR: 1.2 (ref 0.00–1.49)
INR: 1.3 (ref 0.00–1.49)
INR: 1.9 — ABNORMAL HIGH (ref 0.00–1.49)
INR: 2.2 — ABNORMAL HIGH (ref 0.00–1.49)
Prothrombin Time: 14.4 seconds (ref 11.6–15.2)
Prothrombin Time: 14.5 seconds (ref 11.6–15.2)
Prothrombin Time: 15 seconds (ref 11.6–15.2)
Prothrombin Time: 15.9 seconds — ABNORMAL HIGH (ref 11.6–15.2)
Prothrombin Time: 21.3 seconds — ABNORMAL HIGH (ref 11.6–15.2)
Prothrombin Time: 24.3 seconds — ABNORMAL HIGH (ref 11.6–15.2)

## 2011-01-07 LAB — HEMOGLOBIN A1C
Hgb A1c MFr Bld: 6.2 % — ABNORMAL HIGH (ref 4.6–6.1)
Mean Plasma Glucose: 131 mg/dL

## 2011-01-07 LAB — CARDIAC PANEL(CRET KIN+CKTOT+MB+TROPI)
CK, MB: 0.7 ng/mL (ref 0.3–4.0)
Relative Index: INVALID (ref 0.0–2.5)
Total CK: 21 U/L (ref 7–232)
Troponin I: 0.02 ng/mL (ref 0.00–0.06)

## 2011-01-07 LAB — MAGNESIUM
Magnesium: 1.1 mg/dL — ABNORMAL LOW (ref 1.5–2.5)
Magnesium: 1.4 mg/dL — ABNORMAL LOW (ref 1.5–2.5)
Magnesium: 1.7 mg/dL (ref 1.5–2.5)
Magnesium: 1.7 mg/dL (ref 1.5–2.5)
Magnesium: 1.9 mg/dL (ref 1.5–2.5)

## 2011-01-07 LAB — PHOSPHORUS
Phosphorus: 1.8 mg/dL — ABNORMAL LOW (ref 2.3–4.6)
Phosphorus: 2 mg/dL — ABNORMAL LOW (ref 2.3–4.6)
Phosphorus: 2.6 mg/dL (ref 2.3–4.6)
Phosphorus: 2.7 mg/dL (ref 2.3–4.6)
Phosphorus: 3.8 mg/dL (ref 2.3–4.6)

## 2011-01-07 LAB — BRAIN NATRIURETIC PEPTIDE: Pro B Natriuretic peptide (BNP): <30 pg/mL (ref 0.0–100.0)

## 2011-01-07 LAB — APTT
aPTT: 25 seconds (ref 24–37)
aPTT: 30 seconds (ref 24–37)

## 2011-01-07 LAB — TSH: TSH: 0.743 u[IU]/mL (ref 0.350–4.500)

## 2011-01-07 LAB — LIPASE, BLOOD: Lipase: <10 U/L — ABNORMAL LOW (ref 11–59)

## 2011-01-07 LAB — LACTIC ACID, PLASMA: Lactic Acid, Venous: 2.8 mmol/L — ABNORMAL HIGH (ref 0.5–2.2)

## 2011-01-07 LAB — CORTISOL: Cortisol, Plasma: 27.2 ug/dL

## 2011-02-07 ENCOUNTER — Encounter (HOSPITAL_COMMUNITY): Payer: Self-pay | Admitting: Radiology

## 2011-02-07 ENCOUNTER — Emergency Department (HOSPITAL_COMMUNITY)
Admission: EM | Admit: 2011-02-07 | Discharge: 2011-02-07 | Disposition: A | Discharge status: Home / Self Care | Payer: BC Managed Care – PPO | Attending: Emergency Medicine | Admitting: Emergency Medicine

## 2011-02-07 ENCOUNTER — Emergency Department (HOSPITAL_COMMUNITY): Payer: BC Managed Care – PPO

## 2011-02-07 DIAGNOSIS — K861 Other chronic pancreatitis: Secondary | ICD-10-CM | POA: Insufficient documentation

## 2011-02-07 DIAGNOSIS — R0789 Other chest pain: Secondary | ICD-10-CM | POA: Insufficient documentation

## 2011-02-07 DIAGNOSIS — R0609 Other forms of dyspnea: Secondary | ICD-10-CM | POA: Insufficient documentation

## 2011-02-07 DIAGNOSIS — R0989 Other specified symptoms and signs involving the circulatory and respiratory systems: Secondary | ICD-10-CM | POA: Insufficient documentation

## 2011-02-07 DIAGNOSIS — R748 Abnormal levels of other serum enzymes: Secondary | ICD-10-CM | POA: Insufficient documentation

## 2011-02-07 DIAGNOSIS — R109 Unspecified abdominal pain: Secondary | ICD-10-CM | POA: Insufficient documentation

## 2011-02-07 DIAGNOSIS — R110 Nausea: Secondary | ICD-10-CM | POA: Insufficient documentation

## 2011-02-07 DIAGNOSIS — M549 Dorsalgia, unspecified: Secondary | ICD-10-CM | POA: Insufficient documentation

## 2011-02-07 LAB — CBC
HCT: 36.4 % — ABNORMAL LOW (ref 39.0–52.0)
Hemoglobin: 12.1 g/dL — ABNORMAL LOW (ref 13.0–17.0)
MCH: 26.1 pg (ref 26.0–34.0)
MCHC: 33.2 g/dL (ref 30.0–36.0)
MCV: 78.4 fL (ref 78.0–100.0)
Platelets: 145 10*3/uL — ABNORMAL LOW (ref 150–400)
RBC: 4.64 MIL/uL (ref 4.22–5.81)
RDW: 15.1 % (ref 11.5–15.5)
WBC: 14.2 10*3/uL — ABNORMAL HIGH (ref 4.0–10.5)

## 2011-02-07 LAB — LIPASE, BLOOD: Lipase: 4 U/L — ABNORMAL LOW (ref 11–59)

## 2011-02-07 LAB — DIFFERENTIAL
Basophils Absolute: 0 10*3/uL (ref 0.0–0.1)
Basophils Relative: 0 % (ref 0–1)
Eosinophils Absolute: 0.1 10*3/uL (ref 0.0–0.7)
Eosinophils Relative: 1 % (ref 0–5)
Lymphocytes Relative: 4 % — ABNORMAL LOW (ref 12–46)
Lymphs Abs: 0.5 10*3/uL — ABNORMAL LOW (ref 0.7–4.0)
Monocytes Absolute: 1.2 10*3/uL — ABNORMAL HIGH (ref 0.1–1.0)
Monocytes Relative: 8 % (ref 3–12)
Neutro Abs: 12.4 10*3/uL — ABNORMAL HIGH (ref 1.7–7.7)
Neutrophils Relative %: 88 % — ABNORMAL HIGH (ref 43–77)

## 2011-02-07 LAB — URINALYSIS, ROUTINE W REFLEX MICROSCOPIC
Glucose, UA: 250 mg/dL — AB
Hgb urine dipstick: NEGATIVE
Nitrite: NEGATIVE
Protein, ur: NEGATIVE mg/dL
Specific Gravity, Urine: 1.03 (ref 1.005–1.030)
Urobilinogen, UA: 1 mg/dL (ref 0.0–1.0)
pH: 5.5 (ref 5.0–8.0)

## 2011-02-07 LAB — COMPREHENSIVE METABOLIC PANEL
ALT: 424 U/L — ABNORMAL HIGH (ref 0–53)
AST: 451 U/L — ABNORMAL HIGH (ref 0–37)
Albumin: 3.4 g/dL — ABNORMAL LOW (ref 3.5–5.2)
Alkaline Phosphatase: 229 U/L — ABNORMAL HIGH (ref 39–117)
BUN: 10 mg/dL (ref 6–23)
CO2: 24 mEq/L (ref 19–32)
Calcium: 8.6 mg/dL (ref 8.4–10.5)
Chloride: 99 mEq/L (ref 96–112)
Creatinine, Ser: 0.86 mg/dL (ref 0.4–1.5)
GFR calc Af Amer: >60 mL/min (ref >60)
GFR calc non Af Amer: >60 mL/min (ref >60)
Glucose, Bld: 209 mg/dL — ABNORMAL HIGH (ref 70–99)
Potassium: 3.8 mEq/L (ref 3.5–5.1)
Sodium: 135 mEq/L (ref 135–145)
Total Bilirubin: 1 mg/dL (ref 0.3–1.2)
Total Protein: 5.9 g/dL — ABNORMAL LOW (ref 6.0–8.3)

## 2011-02-07 LAB — POCT CARDIAC MARKERS
CKMB, poc: <1 ng/mL — ABNORMAL LOW (ref 1.0–8.0)
Myoglobin, poc: 60.1 ng/mL (ref 12–200)
Troponin i, poc: <0.05 ng/mL (ref 0.00–0.09)

## 2011-02-07 MED ORDER — IOHEXOL 300 MG/ML  SOLN
100.0000 mL | Freq: Once | INTRAMUSCULAR | Status: AC | PRN
  Administered 2011-02-07: 100 mL via INTRAVENOUS

## 2011-02-14 NOTE — H&P (Signed)
NAME:  Victor Garrett, Victor Garrett NO.:  [id number]   MEDICAL RECORD NO.:  [id number]          PATIENT TYPE:  EMS   LOCATION:  ED                           FACILITY:  [facility]   PHYSICIAN:  Coralyn Helling, MD        DATE OF BIRTH:  03-Apr-1951   DATE OF ADMISSION:  05/21/2009  DATE OF DISCHARGE:                              HISTORY & PHYSICAL   PRIMARY CARE PHYSICIAN:  Dr. Alroy Dust.  Gastroenterologist Dr. Bernette Redbird.   ADMISSION DIAGNOSIS:  Abdominal pain.   Victor Garrett is a 61 year old male who has a history of chronic  pancreatitis and he is status post Whipple procedure for this at [facility] in 2000.  He then had a complication with a perforated  gastrojejunal ulcer in 2005.  He says that on August 20th he developed  abdominal pain.  This was associated with several episodes of dry  heaves.  He denied any hematemesis.  He was also feeling bloated.  He  says he normally has about 4-5 bowel movements a day.  His last bowel  movement was approximately 2:00 p.m. on August 20th.  He says he is  passing small amounts of flatus.  He has not noticed any blood in his  stools or blackening of his stools.  He was feeling some chills but was  not sure if he is actually running a fever at all.  He says his  breathing has been okay.  He denied any headaches, dizziness, sinus  congestion or sore throat.  He says his feet have been feeling cold but  he has not noticed any swelling.  He denies any symptoms of weakness.  He was started on Janumet for his diabetes approximately 1 month ago.  He was also on prednisone in May of this year.  I am not sure exactly  why he was on the prednisone.  When he presented to the emergency room  his abdominal pain improved after he was given a dose of Dilaudid.  He  was noted to be hypotensive and has received approximately 2 liters of  intravenous fluid with some improvement in his blood pressure.  He was  also noted to have significant  increase in his liver function tests.   PAST MEDICAL HISTORY:  Significant for:  1. Chronic pancreatitis.  2. Biliary stricture.  3. Paroxysmal atrial fibrillation.  4. Obstructive sleep apnea diagnosed in 1998 with __________.  5. Diabetes mellitus.  6. Low high-density lipoprotein.  7. Irritable bowel syndrome.  8. Diverticulosis.  9. Gastroesophageal reflux disease.  10.Anxiety.   PAST SURGICAL HISTORY:  Significant for:  1. Whipple at [facility] in 2000.  2. Perforated gastrojejunal ulcer in [place] in 2005.   HE HAS AN ALLERGY TO PENICILLIN.   He is married.  He has children.  There is no history of tobacco or  alcohol abuse.   FAMILY HISTORY:  Significant for his mother who had brain cancer and  COPD.   His outpatient medications are:  1. Janumet 50/500 one p.o. b.i.d.  2. Protonix 40 mg daily.  3. Lipram 450-20-4.5-25 two p.o. with dinner.   PHYSICAL EXAMINATION:  GENERAL: He is seen in the emergency room.  He is  somewhat somnolent but arousable, follows all commands and responds  appropriately.  He did receive Dilaudid just prior to my evaluation.  VITAL SIGNS: Temperature is 98.4.  Blood pressure was initially 82/44  but has improved to 108/49 after intravenous fluid.  Heart rate is 72  and regular.  Respiratory rate is 18.  Oxygen saturation is 98%.  HEENT: Pupils are reactive.  Extraocular muscles intact.  He has mild  scleral icterus.  There is no sinus tenderness.  No oral lesions.  No  lymphadenopathy.  No jugular venous distention.  No thyromegaly.  HEART: S1, S2, regular rate, rhythm.  CHEST: He had good air entry and there is no wheezing or rales.  ABDOMEN: Was soft, slightly distended, nontender.  Decreased bowel  sounds.  EXTREMITIES: There is no edema, cyanosis or clubbing.  NEUROLOGIC: He was somnolent after receiving Dilaudid but arousable and  able to follow commands appropriately and move all 4 extremities.   Chest x-ray showed no acute  disease process.   Sodium is 138, potassium is 2.8, chloride is 99, CO2 is 26, glucose is  146, BUN is 10, creatinine is 0.91.  Bilirubin is 2.6, alkaline  phosphatase is 189, AST is 85, ALT is 605, protein is 7.2, albumin is  4.2, calcium is 9.1.  WBC is 15.6, hemoglobin is 15.5, hematocrit is  44.5, platelet count is 187.  Lipase was less than 10.  Troponin was  less than 0.05.   IMPRESSION:  1. Elevated liver function tests in the setting of abdominal pain with      a history of chronic pancreatitis.  I will keep him on a clear      liquid diet.  I will check a hepatitis panel and order a liver      ultrasound.  He has been followed by Dr. Matthias Hughs and will likely      need to have further assistance from Gastroenterology with his      current symptoms of abdominal pain and elevated liver function      tests.  2. Hypotension with worry of early sepsis.  He has responded to      initial fluid bolus.  I will monitor him in a step-down unit.  I      will continue him on intravenous fluids.  If his pressure becomes      problematic again he may need to have central venous access to      monitor his filling pressures and he may also need to be started on      pressor agents.  Of note is that he has been on prednisone recently      and there is a possibility that he could also have some degree of      relative adrenal insufficiency as a result of this.  I will check a      cortisol level and start him empirically on Solu-Cortef pending the      results of his cortisol level.  In addition I will start him on      ciprofloxacin and Flagyl intravenously for potential abdominal      sepsis.  I will check his urine culture and his blood cultures.  I      will also check a lactic acid level.  3. History of diabetes mellitus.  Of note is that he  was recently      started on Janumet approximately 1 month ago.  Given the elevation      in his liver function tests I will hold the Janumet and check  a      lactic acid level.  In the      meantime, I will keep him on clear liquid diet and start him on      sliding scale insulin protocol.  4. Peptic ulcer disease prophylaxis with Protonix intravenously.  5. Deep vein thrombosis prophylaxis with subcutaneous heparin.      Coralyn Helling, MD  Electronically Signed     VS/MEDQ  D:  05/22/2009  T:  05/22/2009  Job:  [id number]

## 2011-02-14 NOTE — Consult Note (Signed)
NAME:  Victor Garrett, Victor Garrett NO.:  [id number]   MEDICAL RECORD NO.:  [id number]          PATIENT TYPE:  INP   LOCATION:  1241                         FACILITY:  [facility]   PHYSICIAN:  Bernette Redbird, M.D.   DATE OF BIRTH:  Jul 16, 1951   DATE OF CONSULTATION:  05/22/2009  DATE OF DISCHARGE:                                 CONSULTATION   CONSULTATIONS:  Dr. Alroy Dust, gastroenterology.   CHIEF COMPLAINT/HISTORY OF PRESENT ILLNESS:  Dr. Sherene Sires of the pulmonary  critical care medicine service asked [place] to see this 60 year old  gentleman because of biliary tract issues.  The patient has been known  to me for years and, in fact, has not been seen by me recently because  he has been doing well.  He is some years status post a Whipple  procedure done at [facility] because of concerns of malignant  biliary stricturing, which fortunately, turned out to be of benign  origin.  He presented to the hospital last night with an episode similar  to something he had 2 years ago, namely, significant abdominal pain  associated with elevated liver chemistries and a drop in blood pressure.  That episode 2 years ago responded to conservative medical management  but as part of that evaluation, he was evaluated over at [facility] by their biliary team, who was unable to cannulate his bile  duct due to his postsurgical anatomy, which includes Billroth II  anatomy.  An MRCP at that time was negative.  Surgery was never  undertaken.  Apparent, he was seen back at [facility] who felt that surgery  might be best deferred.  He then did well until the past few days when  he became symptomatic again with significant upper abdominal symptoms,  specifically pain.  He felt a little bit chilly, but did not have any  overt fevers.   PAST MEDICAL HISTORY:   ALLERGIES:  PENICILLIN.   OUTPATIENT MEDICATIONS:  1. Janumet 50/500.  2. Protonix.  3. Pancreatic enzyme supplementation.   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   MEDICAL ILLNESSES:  Include the above-mentioned pancreatitis, probably  due to a previous history of excessive ethanol consumption and diabetes  for which he was recently started on medication.  He does not have  chronic pancreatic pain nor malabsorptive symptomatology as long as he  remains on the enzyme supplementation.   PHYSICAL EXAM:  CONSTITUTIONAL:  A well-preserved Caucasian male in no  acute distress.  He appears neither anxious nor depressed.  He is  anicteric and without pallor.  CHEST:  Clear.  HEART:  Unremarkable without murmurs or arrhythmias appreciated.  ABDOMEN:  Somewhat adipose, soft, and nontender.  No organomegaly or  masses.  No guarding or tenderness.  Bowel sounds are present.   LABORATORY:  Admission white count was 13,600 and it has risen  progressively to a current level of 22,800.  On  admission, a  differential count was performed showing 86 polys, 8 lymphs, and 5  monocytes.  Platelets on admission were 187,000, dropped to 129,000, and  on repeat are 140,000.  Glucose on admission was 146.  Kidney function  normal.  Admission bilirubin 2.6, rose to 3.8 after overnight hydration.  Alkaline phosphatase dropped from 189 to 154.  AST dropped from 885 to  673.  ALT rose from 605 to 659.  Albumin after hydration is 3.0.  Venous  lactic acid was slightly elevated at 2.8.  Lipase is less than 10.  Troponins negative.  TSH normal.   Abdominal ultrasound shows a gallbladder that is apparently contracted  and filled with stones with chronic appearing gallbladder wall  thickening and a 7 mm bile duct.  There is evidence of a fatty  infiltration of the liver.  Note that a previous nuclear medicine study  about 2 years ago showed normal gallbladder  contractility and ejection  fraction.   IMPRESSION:  The patient appears to have cholecystitis and/or biliary  obstruction, the latter diagnosis supported by elevated liver  chemistries although it is conceivable that they reflect a reactive  hepatopathy to an inflamed gallbladder.   DISCUSSION AND PLAN:  I have discussed the case with Dr. Delton Prairie at  [facility] who was able to locate records pertaining to the  patient's evaluation there 2 years ago, at which time they were unable  to accomplish ERCP with the patient's Billroth II anatomy.  An MRCP was  negative.  It is evident that it would be highly improbable that we  would be able to clear this patient's common duct if indeed it does have  stones causing the obstruction by endoscopic means.  He does not have a  significant dilated intrahepatic ductal system so a PTC procedure,  although possible, would probably be technically challenging.  Thus, the  definitive management for this may well be open surgery and I have  discussed the case with Dr. Johna Sheriff who feels that, as long as the  patient is improving and cooling down on antibiotic therapy, it is best  to continue observation with the intent of a referral back to [facility], electively if possible, or if the patient  worsens, emergently as a transfer.  I feel that this is a valid  approach.  No specific recommendations beyond what is already being done  from my standpoint at this time.           ______________________________  Bernette Redbird, M.D.     RB/MEDQ  D:  05/22/2009  T:  05/23/2009  Job:  [id number]   cc:   Lorne Skeens. Hoxworth, M.D.  [address], [address]  [place]  [place] [address]

## 2011-02-14 NOTE — Consult Note (Signed)
NAME:  LESS, Victor Garrett NO.:  [id number]   MEDICAL RECORD NO.:  [id number]          PATIENT TYPE:  INP   LOCATION:  1241                         FACILITY:  [facility]   PHYSICIAN:  Sharlet Salina T. Hoxworth, M.D.DATE OF BIRTH:  [birth year]   DATE OF CONSULTATION:  05/22/2009  DATE OF DISCHARGE:                                 CONSULTATION   REFERRING PHYSICIAN:  Dr. Sandrea Hughs   CHIEF COMPLAINT:  Abdominal pain.   HISTORY:  I was asked by Dr. Sherene Sires to evaluate Mr. Burda.  He is a 60-  year-old white male with a complex surgical history.  He has a history  of a Whipple procedure at [facility] in 2000 by Dr.  Jacquenette Shone. 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  The patient did reasonably  well from this procedure, although he had some postoperative cardiac  complications and wound infection.  He did, however, have a perforated  gastrojejunal ulcer in 2005 which was operated on in [place].  The  patient also was admitted to [facility] in 2008 with acute  abdominal pain very similar to his current presentation.  At that time,  there was concern over possible cholecystitis and sludge or stones in  the gallbladder.  The patient subsequently was referred to [facility] and then ultimately to [facility].  No intervention was performed at  that time which by the history of the patient and his wife, it may have  been due to the patient's reluctance.  He, however, has done reasonably  well until this current presentation.  He presented yesterday with acute  onset of severe right upper quadrant abdominal pain sudden onset which  now was about 24 hours ago.  This was associated with dry heaves without  any large volume emesis.  The pain continued, and he presented to the  emergency room.  On questioning, he had noticed some yellow color to his  eyes intermittently.  Bowel movements had been  relatively normal.  He  was having some chills but no definite fever.   In the emergency room, he was found to be somewhat hypotensive with  elevated liver tests, was admitted to the ICU and has been treated with  fluid resuscitation and broad-spectrum antibiotics.   Currently, the patient is feeling much better.  He states his pain is  nearly gone.  He has no nausea.   PAST MEDICAL HISTORY:  Surgery significant as above with Whipple  procedure in 2000 at [facility] with the gallbladder not being removed and  some question of previous records that it was used for biliary conduit  with a cholecystojejunostomy.  He also had a perforated gastrojejunal  ulcer in 2005.  He also had apparent cholecystitis in 2008.  Medically,  he has a history of paroxysmal atrial tachycardia, sleep apnea, adult  onset diabetes mellitus, diverticulosis, reflux and anxiety.   MEDICATIONS ON ADMISSION:  1. Janumet 50/500 one b.i.d.  2. Protonix daily.  3. Lipram with dinner.   CURRENT MEDICATIONS:  IV Cipro and Flagyl.  ALLERGIES:  PENICILLIN.   SOCIAL HISTORY:  He has never been more than a minimal social drinker  per his history.  No cigarettes.  He is married.   REVIEW OF SYSTEMS:  GENERAL:  Weight is stable.  Some chills.  No fever  with this illness.  RESPIRATORY:  No shortness of breath, cough,  wheezing.  CARDIAC:  No recent palpitations, chest pain.  ABDOMEN:  GI  as above.  GU:  No urinary burning or frequency.   PHYSICAL EXAMINATION:  VITAL SIGNS:  Currently, he is afebrile, heart  rate 87, blood pressure 99/53, respirations 18, O2 saturations 97% on  room air.  IN GENERAL:  He is an alert, comfortable-appearing white male,  conversant, in no distress.  SKIN:  Warm and dry.  No rash infection.  HEENT:  No palpable mass or thyromegaly.  Sclerae may be mildly icteric.  NECK:  Lymph nodes non-palpable.  LUNGS:  Clear without wheezing or increased work of breathing.  CARDIAC:  Regular rate and  rhythm.  No edema.  ABDOMEN:  Well-healed large bucket handle incision.  Nondistended.  There is minimal right upper quadrant tenderness to deep palpation.  No  guarding.  No discernible masses or organomegaly.  EXTREMITIES:  No edema.  NEUROLOGIC:  Alert, fully oriented.  Motor and sensory exams grossly  normal.   LABORATORY:  White count is 22.8 up from 13.6 on admission, hemoglobin  11.8, platelets 140.  Electrolytes:  BUN, creatinine are normal.  Glucose is 178.  LFTs are elevated with transaminases 673 and 659  respectively.  Alkaline phosphatase 154, bilirubin of 3.8, albumin of 3.  Lipase is less than 10.  Blood cultures are pending.  Lactic acid mildly  elevated at 2.8.   IMAGING:  Ultrasound of the abdomen has shown contracted gallbladder  with gallstones and mildly dilated common hepatic and common bile duct.   ASSESSMENT/PLAN:  Complex patient, status post Whipple procedure in 2000  at [facility] for benign common bile duct stricture and pancreatitis.  His  gallbladder is apparently intact, and there is some question whether it  was used as his route of biliary drainage via a cholecystojejunostomy.  He has a history of admission for apparent cholecystitis and sludge or  stones in 2008.  He now presents with acute abdominal pain, elevated  LFTs and gallstones.  This presentation is likely secondary to acute  cholangitis.  Cholecystitis seems less likely as the primary source of  his illness as he is currently nontender and pain free.  He does appear  improved and more stable from admission, although LFTs and white count  are up from admission.  At this point, I do not see an indication for  emergency surgical intervention.  Would continue support with IV fluids  and antibiotics.  The patient, I believe, needs an endoscopic retrograde  cholangiopancreatography if this is technically possible.  Dr. Matthias Hughs  has seen the patient, and he is contacting [facility] as he was   hospitalized there in his presentation of 2008 to see if endoscopic  retrograde cholangiopancreatography was attempted.  Again, the patient  does not appear to need emergency surgical intervention.  He may well  require cholecystectomy due to stones and repeated problems.  If this is  required, I would recommend referral back to Dr. Jacquenette Shone at [facility]  for this complex procedure and due to the fact that his postsurgical  anatomy is not known to [place].  Will follow with you.  Lorne Skeens. Hoxworth, M.D.  Electronically Signed     BTH/MEDQ  D:  05/22/2009  T:  05/22/2009  Job:  [id number]

## 2011-02-14 NOTE — H&P (Signed)
NAME:  Victor Garrett, Victor Garrett NO.:  [id number]   MEDICAL RECORD NO.:  [id number]          PATIENT TYPE:  INP   LOCATION:  1225                         FACILITY:  [facility]   PHYSICIAN:  Graylin Shiver, M.D.   DATE OF BIRTH:  12/18/1950   DATE OF ADMISSION:  06/04/2007  DATE OF DISCHARGE:                              HISTORY & PHYSICAL   CHIEF COMPLAINT:  Abdominal pain.   HISTORY OF PRESENT ILLNESS:  The patient is a 60 year old white male who  at 9:00 a.m. on June 04, 2007, developed generalized abdominal pain.  This was associated with nausea and vomiting.  Because of this, he came  to the emergency room at [facility].  The patient thought that  this was pancreatitis, which he has had before.  However, when checked  in the emergency room, his amylase and lipase were normal.   The patient has a history of a Whipple's procedure done at [facility] in 2000  for what turned out to be a benign biliary stricture.  In 2005, he had a  perforated anastomotic ulcer when he was out in [facility] and this was  surgically repaired.   The patient feels better at the time that I am seeing him.  He feels  somewhat bloated.  When he was in the emergency room, he did have some  episodic hypotension.  Because of this and because of his pain, the  emergency room physician felt that he should be admitted.   He did have an abdominal ultrasound which showed gallstones and  gallbladder wall thickening.  A CT scan was also done which showed  perihepatic and pericholecystic fluid.  The finding was suggestive of  cholecystitis.   PAST HISTORY:  Surgeries as above.   MEDICAL PROBLEMS:  No other chronic medical problems.   MEDICATIONS:  Librium, dose unknown.  He takes it once a day and a  digestive enzyme he does not know the name of.   SOCIAL HISTORY:  He does not smoke or drink alcohol.   ALLERGIES:  IT DOES NOT SOUND LIKE HE HAS TRUE ALLERGIES, BUT STATES  PENICILLIN MAKES HIM  FEEL BAD.   REVIEW OF SYSTEMS:  He denied fever.  States he has a headache and his  joints ache.  Denies anginal chest pains or dyspnea.   PHYSICAL:  VITAL SIGNS:  Blood pressure 83/47, pulse is 70.  GENERAL:  He does not appear in any acute distress.  HEENT:  He is nonicteric.  NECK:  Supple.  HEART:  Regular rhythm.  No murmurs are heard.  LUNGS:  Clear.  ABDOMEN:  Bowel sounds are normal.  It is soft, not distended, and not  tender at the present time.  I palpated all quadrants of the abdomen,  and there is no abdominal tenderness.  He states he feels bloated.  EXTREMITIES:  Without edema.   LABS:  White blood cell count is somewhat elevated at 14,900.  Hemoglobin is 13.8, hematocrit 40.2.  Prothrombin time is 13.7.  Amylase  52, lipase 14, both of which are normal.  The AST is  257 and ALT is 219,  both of which are elevated.   IMPRESSION:  1. Abdominal pain of uncertain etiology.  Currently, the patient is      not having pain.  This is associated with an elevated white blood      cell count and CT findings as described above.  2. Rule out cholecystitis.  3. Elevated liver function tests (LFTs).  Rule out cholecystitis      versus hepatitis.  4. Status post Whipple's procedure in 2000 at [facility] for what turned out      to be a benign biliary stricture.  5. History of a perforated anastomotic ulcer in 2005 which was      surgically repaired.  6. Transient hypotension.  Rule out early sepsis, perhaps from      gallbladder.   PLAN:  The patient is being admitted for further evaluation.  IV  hydration will be given.  Blood cultures will be obtained.  A hepatitis  profile will be obtained.  I will repeat a CBC and a CMP tomorrow.  I  want to check his bilirubin on that.  Empiric antibiotics will be  started in the form of Mefoxin after blood culture is obtained.  A  surgical consult will be obtained in the morning.           ______________________________  Graylin Shiver,  M.D.     SFG/MEDQ  D:  06/05/2007  T:  06/05/2007  Job:  [id number]   cc:   Bernette Redbird, M.D.  Fax: [phone]

## 2011-02-14 NOTE — Discharge Summary (Signed)
NAME:  Victor Garrett, Victor Garrett NO.:  [id number]   MEDICAL RECORD NO.:  [id number]          PATIENT TYPE:  INP   LOCATION:  1444                         FACILITY:  [facility]   PHYSICIAN:  Shirley Friar, MDDATE OF BIRTH:  [birth year]   DATE OF ADMISSION:  06/04/2007  DATE OF DISCHARGE:                               DISCHARGE SUMMARY   DISCHARGE DIAGNOSES:  1. Possible cholangitis.  2. Elevated liver function tests, secondary to #1.  3. Abdominal pain, nausea and vomiting.  4. Atrial fibrillation.  5. Pleural effusions.  6. History of Whipple's procedure in 2000, secondary to benign biliary      stricture.  7. History of perforated anastomotic ulcer.   DISCHARGE MEDICINES:  1. Cefoxitin 1 gram IV q.6 h.  2. Protonix 40 mg IV daily.  3. Morphine 2 mg IV q.6 h., as needed.   ALLERGIES:  PENICILLIN.   HOSPITAL COURSE:  Victor Garrett is a pleasant, 60 year old white male who  had a Whipple's procedure done at [facility] in 2000, secondary to  benign biliary stricture and also has had a perforated anastomotic ulcer  that was surgically repaired in 2005, while he out of town in  [place], who presented on June 05, 2007 with an acute episode of  generalized abdominal pain, nausea and vomiting.  He had an ultrasound  done on admission which showed gallstones and gallbladder wall  thickening.  A CAT scan was then done which showed perihepatic and  pericholecystic fluid suggestive of cholecystitis.  His liver function  tests were elevated on admission with a AST of 257, ALT of 219, total  bilirubin of 3.9 and alkaline phosphatase of 137.  His LFTs have trended  down since admission, down to a total bilirubin 1.4, AST of 41, ALT 112  and alkaline phosphatase of 139.  His white blood count was elevated at  14 on admission.  It is currently 7.3.  He was hypotensive on admission  and systolics in the 80s that responded to fluids and supportive care.  He was placed  empirically on cefoxitin, and blood cultures were drawn  which are negative to date.  He had a HIDA scan done to look for  cholecystitis, and this was normal with a normal gallbladder  contractility, ejection fraction and patent cystic and common bile duct.  On June 06, 2007, he developed atrial fibrillation with heart rate  in the 130s to 150s (rapid ventricular response) and was put on a brief  period of Cardizem drip.  He responded to the Cardizem, and his atrial  fibrillation resolved.  A CT angiogram was done following the atrial  fibrillation, and this was negative for pulmonary embolus.  It did show  bilateral pleural effusions, right greater than left and some his  ascites.   During his admission, his hypotension resolved, but he did develop some  right flank pain and right upper back pain.  He also was seen by Dr.  Wenda Low from surgery shortly after admission, because of the  possible concern for cholecystitis.  During his Whipple's postprocedure,  his gallbladder  was not taken out, and it is thought that he had a  biliary cholecystojejunostomy, as his anatomy.  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  Will plan to send him to a tertiary  care center, specifically [facility],  due to his altered anatomy from his Whipple's procedure to have a ERCP  done and possible balloon versus stenting of his biliary system.  At  this time, transfer has been arranged for June 09, 2007, with the  procedure being done on June 10, 2007.  Further information may be  forthcoming from the on-call doctor this weekend if any changes occur,  but at that this time the patient is stable for transfer to [facility].  He has tolerated a full liquid diet,  thus far.   CONSULTATIONS:  1. Wenda Low from surgery.  2. Crista Curb from [facility].   DISCHARGE DISPOSITION:  Inpatient transfer to [facility], admission to Dr. Lajean Silvius from [facility] for ERCP by Dr. Benedetto Goad.   DISCHARGE DIET:  Liquid diet.      Shirley Friar, MD  Electronically Signed     VCS/MEDQ  D:  06/07/2007  T:  06/08/2007  Job:  [id number]   cc:   Bernette Redbird, M.D.  Fax: [phone]   Lonzo Cloud. Kriste Basque, MD  [address]  [place]  [place] [address]   Benedetto Goad, M.D.  [facility] B. Daphine Deutscher, MD  [address], [address]  [place]  [place] [address]   Corinna L. Lendell Caprice, MD   [facility] Dr. Lajean Silvius

## 2011-02-14 NOTE — Consult Note (Signed)
NAME:  Victor Garrett, Victor Garrett               ACCOUNT NO.:  [id number]   MEDICAL RECORD NO.:  [id number]          PATIENT TYPE:  INP   LOCATION:  1225                         FACILITY:  [facility]   PHYSICIAN:  Corinna L. Lendell Caprice, MDDATE OF BIRTH:  07-11-1951   DATE OF CONSULTATION:  06/06/2007  DATE OF DISCHARGE:                                 CONSULTATION   REQUESTING PHYSICIAN:  Dr. Bosie Clos.   ATTENDING PHYSICIAN:  Dr. Bosie Clos.   REASON FOR CONSULTATION:  Atrial fibrillation with rapid ventricular  response.   IMPRESSIONS/RECOMMENDATIONS:  1. Atrial fibrillation with rapid ventricular response:  The patient      reports this has occurred several times postoperatively.  Records      from [facility] report atrial flutter with rapid ventricular response      which was difficult to control with beta blocker, calcium channel      blocker and digoxin.  He subsequently required cardioversion at      that time.  He does not have a cardiologist.  His blood pressure is      currently within normal limits and I will start with Cardizem bolus      and Cardizem drip.  I will also check cardiac enzymes, TSH,      echocardiogram.  His electrolytes were not terribly remarkable.      The patient does report pain in his lower right      chest/flank/abdominal area that started last night; it is sharp; it      is not necessarily pleuritic in nature.  He also reports that he      has occasional chest pain which radiates to his back that has been      occurring all summer; these episodes are usually accompanied by      dyspnea.  He has no history of pulmonary embolus, but I will order      a D-dimer and if positive, he may need further workup to rule out      pulmonary embolus as the etiology.  He also may need a cardiology      consult if he remains in atrial fibrillation are echocardiogram is      abnormal  2. Resolved hypotension.  3. 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  4. Increased liver function tests, being managed by Gastroenterology      and Surgery.   HISTORY OF PRESENT ILLNESS:  Mr. Victor Garrett is a 60 year old white male  patient who was admitted to the [facility] GI service with abdominal pain and  there was concern over cholecystitis.  His liver function tests were  elevated.  He was also apparently somewhat hypotensive and was started  on empiric antibiotics.  His HIDA scan has turned out to be negative.  He developed atrial fibrillation with rapid ventricular response here in  the intensive care unit today.  His primary care physician is Dr. Kriste Basque.  I discussed this with Dr. Bosie Clos and he wishes that I proceed with  consult.  The patient is currently feeling some palpitations.  He has no  chest pain and no shortness of breath, other than what is mentioned  above.   PAST MEDICAL HISTORY:  As above.   MEDICATIONS:  Protonix, Mefoxin, morphine as needed.   SOCIAL HISTORY:  The patient does not smoke or drink.   FAMILY HISTORY:  Noncontributory.   REVIEW OF SYSTEMS:  As above, otherwise negative.   PHYSICAL EXAMINATION:  VITAL SIGNS:  His temperature is 98.8, pulse is  about 140, respiratory rate 15, blood pressure 117/80, oxygen saturation  98% on room air.  GENERAL:  The patient is well-nourished, well-developed, in no acute  distress.  HEENT:  Normocephalic, atraumatic.  Pupils equal, round and reactive to  light.  Moist mucous membranes.  NECK:  Supple.  No carotid bruits.  No thyromegaly.  LUNGS:  Clear to auscultation bilaterally without wheezes, rhonchi or  rales.  CARDIOVASCULAR:  Irregularly irregular and rapid, no murmurs, gallops,  rubs.  ABDOMEN:  Soft, nontender, nondistended.  GU AND RECTAL:  Deferred.  EXTREMITIES:  No clubbing, cyanosis or edema.  No calf tenderness.  Homans sign negative.  NEUROLOGIC:  Alert and oriented.  Cranial nerves and sensorimotor exam  are intact.  PSYCHIATRIC:  Normal affect.  SKIN:   No rash. He is jaundiced.   LABORATORY DATA:  White blood cell count on admission was 14.9 with 93%  neutrophils; today, his white blood cell count is 10.6, hemoglobin 12.5,  hematocrit 36.4, platelet count 162,000.  Complete metabolic panel today  reveals a potassium of 4.2, glucose was 67, total bilirubin 3, alkaline  phosphatase 160, SGOT 90, SGPT 168, albumin 2.9.  Ammonia level 2 days  ago was 41, amylase and lipase normal.  Urine drug screen positive for  opiates.  Blood cultures are negative to date.   Twelve-lead EKG shows atrial fibrillation with rapid ventricular  response, rate of 130.   Ultrasound of the abdomen showed thickened gallbladder.   CT of the abdomen and pelvis showed status post Whipple procedure,  perihepatic fluid with enhancement of the gallbladder wall and  pericholecystic fluid worrisome for cholecystitis, progressive atrophy  of the pancreas, thickened wall of the descending colon, diverticulosis,  marked prostatomegaly.   HIDA scan shows normal gallbladder, contractility and ejection fraction,  patent cystic duct and common bile duct.     I would like to thank Dr. Bosie Clos for this consultation.      Corinna L. Lendell Caprice, MD  Electronically Signed     CLS/MEDQ  D:  06/06/2007  T:  06/07/2007  Job:  [id number]

## 2011-02-17 NOTE — Procedures (Signed)
[place]  Patient:    Victor Garrett, Victor Garrett                 MRN: [id number] Proc. Date: 08/06/00 Adm. Date:  [id number] Disc. Date: [id number] Attending:  Rich Brave CC:         Lonzo Cloud. Kriste Basque, M.D. [facility]   Procedure Report  PROCEDURE PERFORMED:  Esophagoscopy with biopsies.  ENDOSCOPIST:  Florencia Reasons, M.D.  INDICATIONS FOR PROCEDURE:  The patient is a 60 year old status post Whipple procedure about a year ago, performed because of stricturing of the bile duct which fortunately turned out to be of a benign etiology.  He now presents with recurrently hemoccult positive stool.  FINDINGS:  Moderately severe hemorrhagic gastritis.  Unable to find outlet of stomach.  DESCRIPTION OF PROCEDURE:  The nature, purpose and risks of the procedure had been discussed with the patient, who provided written consent.  Sedation was fentanyl 87.5 mcg and Versed 8 mg IV without arrhythmias or desaturation. The Olympus video endoscope was passed under direct vision.  The vocal cords looked normal and the esophagus was quite easily entered.  In the distal esophagus, there was a submucosal fullness, possibly representing some sort of benign submucosal tumor or nodule, measuring about 1 to 2 cm across.  The overlying mucosa was normal and no biopsies were obtained. The distal esophageal mucosa appeared slightly, chronically inflamed, perhaps due to previous acid reflux but there was no active acute esophagitis, no evidence of Barretts esophagus, no friability or erosions, no varices, infection or neoplasia.  No ring, stricture or hiatal hernia was appreciated.  The stomach was entered.  It was immediately noted that the patient had a combination of moderate bilious residual and more especially, "motor oil" dark fluid and innumerable gastric mucosa hemorrhages although no actual coffee ground flecks were floating free within the gastric lumen.  The  gastric mucosa itself appeared perhaps slightly erythematous but this was not overwhelmingly impressive.  I did not see any erosions or ulcers or any polyps or masses.  Interestingly, despite examining the stomach for about 10 or 15 minutes, I was unable to identify the gastric outlet.  Presumably, the patient has gastroenterostomy status post Whipple procedure (it could have been the pylorus-sparing Whipple procedure, I do not have the operative report, but there was certainly no evidence of anything that looked like a pylorus and evaluation showed what appeared to be some puckered mucosa with a Prolene suture, implying that the distal stomach had been oversewn).  Specifically, I was unable to identify any posterior gastrojejunostomy or other form of gastroenterostomy.  I obtained several biopsies from the distal portion and midportion of the stomach prior to removal of the scope.  The patient tolerated the procedure well and there were no apparent complications.  IMPRESSION: 1. Moderately severe hemorrhagic gastritis which presumably accounts for    the patients hemoccult positive stool. 2. Unable to identify gastric outlet. 3. Distal esophageal submucosal mass, of doubtful clinical significance.  NOTE: 1. The patient denies recent exposure to ulcerogenic medications to account    for the hemorrhagic gastritis, which I observed today, implying that it is    probably due to bile reflux. 2. The patient does not have obvious gastric outlet obstruction symptoms.  PLAN: 1. Await pathology. 2. The patient will need an upper GI series to further define his gastric    anatomy. 3. Proceed to colonoscopic evaluation. DD:  08/06/00 TD:  08/07/00 Job: [id number]

## 2011-02-17 NOTE — Op Note (Signed)
NAME:  Victor Garrett, Victor Garrett                         ACCOUNT NO.:  [id number]   MEDICAL RECORD NO.:  [id number]                   PATIENT TYPE:  AMB   LOCATION:  ENDO                                 FACILITY:  [facility]   PHYSICIAN:  Bernette Redbird, M.D.                DATE OF BIRTH:  1951-10-02   DATE OF PROCEDURE:  09/22/2003  DATE OF DISCHARGE:                                 OPERATIVE REPORT   PROCEDURE PERFORMED:  Upper endoscopy with biopsies.   ENDOSCOPIST:  Florencia Reasons, M.D.   INDICATIONS FOR PROCEDURE:  The patient is a 60 year old gentleman status  post a Whipple procedure several years ago for what turned out to be,  thankfully, a benign biliary stricture probably in association with some  chronic pancreatitis of unclear cause.  In any event, while traveling  recently in [place], [place] at a trade show, the patient became acutely  ill and ultimately septic and was found to have perforation of the jejunum  just past the gastroenteric anastomosis.  This was oversewn and the patient  has made a good recovery at home.   FINDINGS:  Healing of perforated ulcer with patent gastrojejunostomy.   DESCRIPTION OF PROCEDURE:  The nature, purpose and risks of the procedure  were familiar to the patient from prior examination and he provided written  consent.  Sedation was fentanyl 80 mcg and Versed 8 mg IV without  arrhythmias or desaturation.  The Olympus video endoscope was passed under  direct vision entering the esophagus without significant difficulty.  The  esophagus was normal, without evidence of reflux esophagitis, Barrett's  esophagus, varices, infection or neoplasia.  No ring, stricture or  significant hiatal hernia was appreciated.   The stomach contained a clear bilious residual of small to moderate size but  was suctioned up.  The gastroenterostomy was widely patent and without  nodularity, stenosis or ulceration.  Just inside the gastroenterostomy, I  encountered the  two limbs of the patient's gastrojejunostomy.  Each limb was  explored for a moderate distance, probably 5 to 10 cm.  There was absolutely  no evidence of any residual ulceration anywhere in the examined areas of the  bowel.  I could not tell specifically where the perforation had been  oversewn.   Antral biopsies were obtained from the stomach to check for Helicobacter  pylori infection.  There was some dimpling of the mucosa probably  corresponding to an antrectomy but I did not identify any native residual  pylorus (originally the patient had been reported to have had a pylorus  sparing Whipple procedure, but that does not appear to be the case  endoscopically).  The scope was then removed from the patient.  He tolerated  the procedure well and there were no apparent complications.   IMPRESSION:  1. Healing of previous perforated jejunal ulcer, diagnosis code 534.10.  2. Patent gastrojejunostomy.  3. Bile reflux.  PLAN:  Continue proton pump inhibitor therapy indefinitely.                                               Bernette Redbird, M.D.    RB/MEDQ  D:  09/22/2003  T:  09/23/2003  Job:  [id number]   cc:   Lonzo Cloud. Kriste Basque, M.D. [facility]   Dr. Milana Kidney, Gen Surg, [facility]

## 2011-02-17 NOTE — Procedures (Signed)
[facility]  Patient:    Victor Garrett, Victor Garrett                 MRN: [id number] Proc. Date: 08/06/00 Adm. Date:  [id number] Attending:  Rich Brave CC:         Lonzo Cloud. Kriste Basque, M.D. [facility]                           Procedure Report  PROCEDURE:  Colonoscopy.  INDICATIONS FOR PROCEDURE:  Hemoccult positive stool in a 59 year old gentleman now about a year status post Whipple procedure for what turned out to be a benign stricture in his bile duct.  FINDINGS:  Left sided diverticulosis.  DESCRIPTION OF PROCEDURE:  The nature, purpose and risks of the procedure had been discussed with the patient who provided written consent. This procedure was performed following his upper endoscopy with total sedation for the 2 procedures being fentanyl 100 mcg and Versed 10 mg IV without arrhythmias or desaturation.  Digital exam of the prostate was unremarkable. The Olympus pediatric video colonoscope was quite easily advanced to the terminal ileum which had a normal appearance and pullback was then performed. The quality of the prep was excellent and it is felt that all areas were well seen.  There was some left sided diverticulosis but no polyps, cancer, colitis, or vascular malformations were observed and retroflexion of the rectum was unremarkable. No biopsies were obtained. The patient tolerated the procedure well and there were no apparent complications.  IMPRESSION: 1. Left sided diverticulosis. 2. Otherwise normal exam, without source of heme positive stool identified.  PLAN:  Flexible sigmoidoscopy for colon cancer screening purposes in 5 years.  Note that the patient has a history of post-Whipple procedure diarrhea, but I saw no endoscopic evidence of any source of such diarrhea and it was not felt that random mucosal biopsies were necessary. DD:  08/06/00 TD:  08/07/00 Job: [id number]

## 2011-02-17 NOTE — Assessment & Plan Note (Signed)
[facility]                               PULMONARY OFFICE NOTE   MACYN, REMMERT                      MRN:          [id number]  DATE:06/14/2006                            DOB:          08-14-1951   HISTORY OF PRESENT ILLNESS:  This is a pleasant male patient of Dr. Jodelle Green  who has a known history of chronic pancreatitis with associated biliary  stricture status post Whipple procedure and also a post perforated  gastrojejunostomy ulceration.  The patient presents for an acute office  visit.  The patient presents today reporting that he found a tick along his  left lower back area approximately three days ago which he removed.  He  describes it as a very small tick that was easy to remove.  Subsequently,  within 24 hours he developed a small pruritic rash along his ankles that has  moved up his right leg up to his trunk area.  He denies any fever, joint  pain, abdominal pain, nausea or vomiting.  The patient denies any rash along  his left side or facial or palms or soles.  The patient reports he was  working out in the woods over the weekend prior to removing this tick.  The  patient also reports he had a new pet as well.   PAST MEDICAL HISTORY:  Reviewed.   CURRENT MEDICATIONS:  Reviewed.   PHYSICAL EXAMINATION:  GENERAL:  The patient is a pleasant male in no acute  distress.  VITAL SIGNS:  He is afebrile with stable vital signs.  O2 saturations of 98%  on room air.  HEENT:  Sclerae are anicteric.  Posterior pharynx is clear.  NECK:  Supple without cervical adenopathy.  No JVD.  LUNGS:  Lung sounds are clear to auscultation bilaterally.  No wheezing or  crackles noted.  CARDIAC:  S1 and S2 without murmur, rub or gallop.  ABDOMEN:  Soft without any hepatosplenomegaly.  No guarding or rebound  noted.  Previous well-healed surgical scars noted, mid-abdominal.  EXTREMITIES:  Warm without any calf tenderness.  No clubbing, cyanosis or  edema.  Joints are normal without any swelling or redness noted.  The  patient does have scattered punctate papules along the right leg, lower  bilateral abdomen, and a few scattered along the lower back.  There is a  small papule along the left lower that the patient says is where the  previous tick was removed.   IMPRESSION AND PLAN:  Recent tick bite with removal with a subsequent rash.  The patient will be empirically treated with doxycycline x10 days.  A [facility] Spotted titer has been sent although the patient has atypical  symptoms for this including afebrile and a unilateral rash  developing.  The patient is to use cool compresses p.r.n., cortisone cream  to the pruritic areas if needed.  The patient will follow up in two weeks  with Dr. Kriste Basque or sooner if needed.  Rubye Oaks, NP                                Lonzo Cloud. Kriste Basque, MD   TP/MedQ  DD:  06/14/2006  DT:  06/15/2006  Job #:  [id number]

## 2011-03-07 ENCOUNTER — Other Ambulatory Visit: Payer: Self-pay | Admitting: *Deleted

## 2011-03-07 MED ORDER — SITAGLIPTIN PHOS-METFORMIN HCL 50-500 MG PO TABS: 1.0000 | ORAL_TABLET | Freq: Two times a day (BID) | ORAL | Status: DC

## 2011-05-11 ENCOUNTER — Other Ambulatory Visit: Payer: Self-pay | Admitting: *Deleted

## 2011-05-11 MED ORDER — SITAGLIPTIN PHOS-METFORMIN HCL 50-500 MG PO TABS: 1.0000 | ORAL_TABLET | Freq: Two times a day (BID) | ORAL | Status: DC

## 2011-05-15 ENCOUNTER — Encounter: Payer: Self-pay | Admitting: Cardiology

## 2011-06-29 ENCOUNTER — Other Ambulatory Visit: Payer: Self-pay | Admitting: Pulmonary Disease

## 2011-07-14 LAB — COMPREHENSIVE METABOLIC PANEL
ALT: 112 — ABNORMAL HIGH
ALT: 168 — ABNORMAL HIGH
ALT: 219 — ABNORMAL HIGH
ALT: 219 — ABNORMAL HIGH
ALT: 83 — ABNORMAL HIGH
ALT: 88 — ABNORMAL HIGH
AST: 175 — ABNORMAL HIGH
AST: 257 — ABNORMAL HIGH
AST: 36
AST: 41 — ABNORMAL HIGH
AST: 46 — ABNORMAL HIGH
AST: 90 — ABNORMAL HIGH
Albumin: 2.7 — ABNORMAL LOW
Albumin: 2.8 — ABNORMAL LOW
Albumin: 2.9 — ABNORMAL LOW
Albumin: 2.9 — ABNORMAL LOW
Albumin: 3 — ABNORMAL LOW
Albumin: 4
Alkaline Phosphatase: 127 — ABNORMAL HIGH
Alkaline Phosphatase: 137 — ABNORMAL HIGH
Alkaline Phosphatase: 139 — ABNORMAL HIGH
Alkaline Phosphatase: 140 — ABNORMAL HIGH
Alkaline Phosphatase: 160 — ABNORMAL HIGH
Alkaline Phosphatase: 203 — ABNORMAL HIGH
BUN: 11
BUN: 2 — ABNORMAL LOW
BUN: 2 — ABNORMAL LOW
BUN: 3 — ABNORMAL LOW
BUN: 8
BUN: 9
CO2: 21
CO2: 24
CO2: 25
CO2: 26
CO2: 28
CO2: 28
Calcium: 7.3 — ABNORMAL LOW
Calcium: 7.9 — ABNORMAL LOW
Calcium: 8 — ABNORMAL LOW
Calcium: 8.3 — ABNORMAL LOW
Calcium: 8.6
Calcium: 9.3
Chloride: 105
Chloride: 107
Chloride: 108
Chloride: 108
Chloride: 111
Chloride: 111
Creatinine, Ser: 0.77
Creatinine, Ser: 0.79
Creatinine, Ser: 0.86
Creatinine, Ser: 0.89
Creatinine, Ser: 0.9
Creatinine, Ser: 0.91
GFR calc Af Amer: >60
GFR calc Af Amer: >60
GFR calc Af Amer: >60
GFR calc Af Amer: >60
GFR calc Af Amer: >60
GFR calc Af Amer: >60
GFR calc non Af Amer: >60
GFR calc non Af Amer: >60
GFR calc non Af Amer: >60
GFR calc non Af Amer: >60
GFR calc non Af Amer: >60
GFR calc non Af Amer: >60
Glucose, Bld: 103 — ABNORMAL HIGH
Glucose, Bld: 131 — ABNORMAL HIGH
Glucose, Bld: 143 — ABNORMAL HIGH
Glucose, Bld: 148 — ABNORMAL HIGH
Glucose, Bld: 165 — ABNORMAL HIGH
Glucose, Bld: 67 — ABNORMAL LOW
Potassium: 3.3 — ABNORMAL LOW
Potassium: 3.8
Potassium: 3.9
Potassium: 4
Potassium: 4.2
Potassium: 4.4
Sodium: 135
Sodium: 138
Sodium: 141
Sodium: 141
Sodium: 141
Sodium: 142
Total Bilirubin: 0.9
Total Bilirubin: 1
Total Bilirubin: 1.4 — ABNORMAL HIGH
Total Bilirubin: 2.9 — ABNORMAL HIGH
Total Bilirubin: 3 — ABNORMAL HIGH
Total Bilirubin: 3.9 — ABNORMAL HIGH
Total Protein: 5 — ABNORMAL LOW
Total Protein: 5.1 — ABNORMAL LOW
Total Protein: 5.1 — ABNORMAL LOW
Total Protein: 5.3 — ABNORMAL LOW
Total Protein: 5.4 — ABNORMAL LOW
Total Protein: 6.4

## 2011-07-14 LAB — CULTURE, BLOOD (ROUTINE X 2)
Culture: NO GROWTH
Culture: NO GROWTH

## 2011-07-14 LAB — IRON AND TIBC
Iron: 77
Saturation Ratios: 28
TIBC: 280
UIBC: 203

## 2011-07-14 LAB — DIFFERENTIAL
Basophils Absolute: 0
Basophils Absolute: 0
Basophils Absolute: 0
Basophils Absolute: 0
Basophils Absolute: 0
Basophils Absolute: 0
Basophils Relative: 0
Basophils Relative: 0
Basophils Relative: 0
Basophils Relative: 0
Basophils Relative: 0
Basophils Relative: 1
Eosinophils Absolute: 0
Eosinophils Absolute: 0.1
Eosinophils Absolute: 0.1
Eosinophils Absolute: 0.1
Eosinophils Absolute: 0.1
Eosinophils Absolute: 0.2
Eosinophils Relative: 0
Eosinophils Relative: 1
Eosinophils Relative: 1
Eosinophils Relative: 1
Eosinophils Relative: 2
Eosinophils Relative: 3
Lymphocytes Relative: 10 — ABNORMAL LOW
Lymphocytes Relative: 18
Lymphocytes Relative: 2 — ABNORMAL LOW
Lymphocytes Relative: 23
Lymphocytes Relative: 23
Lymphocytes Relative: 7 — ABNORMAL LOW
Lymphs Abs: 0.3 — ABNORMAL LOW
Lymphs Abs: 1
Lymphs Abs: 1
Lymphs Abs: 1.3
Lymphs Abs: 1.5
Lymphs Abs: 1.5
Monocytes Absolute: 0.4
Monocytes Absolute: 0.5
Monocytes Absolute: 0.6
Monocytes Absolute: 0.6
Monocytes Absolute: 0.7
Monocytes Absolute: 0.8 — ABNORMAL HIGH
Monocytes Relative: 5
Monocytes Relative: 5
Monocytes Relative: 5
Monocytes Relative: 6
Monocytes Relative: 7
Monocytes Relative: 8
Neutro Abs: 12.2 — ABNORMAL HIGH
Neutro Abs: 13.8 — ABNORMAL HIGH
Neutro Abs: 4.6
Neutro Abs: 4.6
Neutro Abs: 5.3
Neutro Abs: 9 — ABNORMAL HIGH
Neutrophils Relative %: 68
Neutrophils Relative %: 69
Neutrophils Relative %: 73
Neutrophils Relative %: 84 — ABNORMAL HIGH
Neutrophils Relative %: 87 — ABNORMAL HIGH
Neutrophils Relative %: 93 — ABNORMAL HIGH

## 2011-07-14 LAB — CBC
HCT: 33.4 — ABNORMAL LOW
HCT: 36.4 — ABNORMAL LOW
HCT: 36.6 — ABNORMAL LOW
HCT: 36.7 — ABNORMAL LOW
HCT: 36.8 — ABNORMAL LOW
HCT: 40.2
Hemoglobin: 11.6 — ABNORMAL LOW
Hemoglobin: 12.4 — ABNORMAL LOW
Hemoglobin: 12.4 — ABNORMAL LOW
Hemoglobin: 12.5 — ABNORMAL LOW
Hemoglobin: 12.5 — ABNORMAL LOW
Hemoglobin: 13.8
MCHC: 33.7
MCHC: 33.8
MCHC: 34
MCHC: 34.3
MCHC: 34.3
MCHC: 34.7
MCV: 83.7
MCV: 84.3
MCV: 84.5
MCV: 85.1
MCV: 85.2
MCV: 85.2
Platelets: 157
Platelets: 162
Platelets: 170
Platelets: 177
Platelets: 202
Platelets: 208
RBC: 3.98 — ABNORMAL LOW
RBC: 4.3
RBC: 4.3
RBC: 4.31
RBC: 4.32
RBC: 4.76
RDW: 14
RDW: 14.2 — ABNORMAL HIGH
RDW: 14.2 — ABNORMAL HIGH
RDW: 14.4 — ABNORMAL HIGH
RDW: 14.5 — ABNORMAL HIGH
RDW: 14.7 — ABNORMAL HIGH
WBC: 10.6 — ABNORMAL HIGH
WBC: 14 — ABNORMAL HIGH
WBC: 14.9 — ABNORMAL HIGH
WBC: 6.7
WBC: 6.8
WBC: 7.3

## 2011-07-14 LAB — PROTIME-INR
INR: 1
Prothrombin Time: 13.7

## 2011-07-14 LAB — APTT: aPTT: 27

## 2011-07-14 LAB — RAPID URINE DRUG SCREEN, HOSP PERFORMED
Amphetamines: NOT DETECTED
Barbiturates: NOT DETECTED
Benzodiazepines: NOT DETECTED
Cocaine: NOT DETECTED
Opiates: POSITIVE — AB
Tetrahydrocannabinol: NOT DETECTED

## 2011-07-14 LAB — MAGNESIUM: Magnesium: 1.7

## 2011-07-14 LAB — SAMPLE TO BLOOD BANK

## 2011-07-14 LAB — CARDIAC PANEL(CRET KIN+CKTOT+MB+TROPI)
CK, MB: 1.5
Relative Index: INVALID
Total CK: 31
Troponin I: 0.02

## 2011-07-14 LAB — AMMONIA: Ammonia: 41 — ABNORMAL HIGH

## 2011-07-14 LAB — HEPATITIS A ANTIBODY, IGM: Hep A IgM: NEGATIVE

## 2011-07-14 LAB — LIPASE, BLOOD: Lipase: 14

## 2011-07-14 LAB — AMYLASE: Amylase: 52

## 2011-07-14 LAB — FERRITIN: Ferritin: 89 (ref 22–322)

## 2011-07-14 LAB — ANTI-SMOOTH MUSCLE ANTIBODY, IGG: F-Actin IgG: <20 U (ref <20)

## 2011-07-14 LAB — HEPATITIS B SURFACE ANTIGEN: Hepatitis B Surface Ag: NEGATIVE

## 2011-07-14 LAB — TSH: TSH: 0.635

## 2011-07-14 LAB — MITOCHONDRIAL ANTIBODIES: Mitochondrial M2 Ab, IgG: <20.1 Units (ref <20.1)

## 2011-07-14 LAB — D-DIMER, QUANTITATIVE: D-Dimer, Quant: 1.31 — ABNORMAL HIGH

## 2011-07-14 LAB — ANA: Anti Nuclear Antibody(ANA): NEGATIVE

## 2011-07-14 LAB — CERULOPLASMIN: Ceruloplasmin: 34

## 2011-07-14 LAB — HEPATITIS C ANTIBODY: HCV Ab: NEGATIVE

## 2011-10-04 ENCOUNTER — Telehealth: Payer: Self-pay | Admitting: Pulmonary Disease

## 2011-10-04 NOTE — Telephone Encounter (Signed)
SN called and spoke with Dr. Armstead Peaks at [facility]---to notify SN of pts upcoming surgery.

## 2011-10-05 ENCOUNTER — Telehealth: Payer: Self-pay | Admitting: Pulmonary Disease

## 2011-10-05 DIAGNOSIS — I4891 Unspecified atrial fibrillation: Secondary | ICD-10-CM

## 2011-10-05 NOTE — Telephone Encounter (Signed)
I spoke with pt and he states he needs to be set up for a stress test. Pt states he is scheduled to have surgery on 10/27/11 and needs the stress test since he has had complications w/ afib. Dr. Jacquenette Shone at [facility] is going to do the surgery. Pt states he does not have a cardiologists. Pt last OV was 05/09/10.  Per SN pt needs referral to Cardiology for pre-op clearance. I have made pt aware of this and referral has been sent.

## 2011-10-09 ENCOUNTER — Ambulatory Visit (INDEPENDENT_AMBULATORY_CARE_PROVIDER_SITE_OTHER): Payer: BC Managed Care – PPO | Admitting: Internal Medicine

## 2011-10-09 ENCOUNTER — Encounter: Payer: Self-pay | Admitting: Internal Medicine

## 2011-10-09 DIAGNOSIS — I4891 Unspecified atrial fibrillation: Secondary | ICD-10-CM

## 2011-10-09 DIAGNOSIS — F3289 Other specified depressive episodes: Secondary | ICD-10-CM

## 2011-10-09 DIAGNOSIS — Z01810 Encounter for preprocedural cardiovascular examination: Secondary | ICD-10-CM

## 2011-10-09 DIAGNOSIS — I059 Rheumatic mitral valve disease, unspecified: Secondary | ICD-10-CM

## 2011-10-09 DIAGNOSIS — R5383 Other fatigue: Secondary | ICD-10-CM

## 2011-10-09 DIAGNOSIS — R5381 Other malaise: Secondary | ICD-10-CM

## 2011-10-09 DIAGNOSIS — F329 Major depressive disorder, single episode, unspecified: Secondary | ICD-10-CM

## 2011-10-09 DIAGNOSIS — I340 Nonrheumatic mitral (valve) insufficiency: Secondary | ICD-10-CM

## 2011-10-09 DIAGNOSIS — I080 Rheumatic disorders of both mitral and aortic valves: Secondary | ICD-10-CM

## 2011-10-09 DIAGNOSIS — R0602 Shortness of breath: Secondary | ICD-10-CM

## 2011-10-09 MED ORDER — METOPROLOL TARTRATE 25 MG PO TABS: 25.0000 mg | ORAL_TABLET | Freq: Two times a day (BID) | ORAL | Status: DC

## 2011-10-09 NOTE — Patient Instructions (Signed)
Your physician has recommended that you wear an event monitor. Event monitors are medical devices that record the heart's electrical activity. Doctors most often [place] these monitors to diagnose arrhythmias. Arrhythmias are problems with the speed or rhythm of the heartbeat. The monitor is a small, portable device. You can wear one while you do your normal daily activities. This is usually used to diagnose what is causing palpitations/syncope (passing out). KING OF HEARTS-ATRIAL FIB  Your physician has requested that you have en exercise stress myoview. For further information please visit [email or web address] Please follow instruction sheet, as given.   Your physician has requested that you have an echocardiogram. Echocardiography is a painless test that uses sound waves to create images of your heart. It provides your doctor with information about the size and shape of your heart and how well your heart's chambers and valves are working. This procedure takes approximately one hour. There are no restrictions for this procedure.   START METOPROLOL TART 25 MG ONE TABLET TWICE DAILY=START 5 DAYS PRIOR TO SURGERY

## 2011-10-09 NOTE — Progress Notes (Signed)
HPI  Victor Garrett is a 61 y.o. male  At the request of Dr. Kriste Basque because of a history of atrial fibrillation and the need for major abdominal surgery. He ihas a history of a Whipple procedures in  2000 where benign scar tissue was found.  He has had multiple problems with his GI tract since, he has been eating liquids for the last month or 2 and has scheduled surgery at the end of January  After his surgery in 2000 he had rapid atrial fibrillation. This recurred again in 2004 following hospitalization for perforated ulcer and then again in 2008. After the latter occasion he was transferred to [facility] those records are not available. We do know that he terminated spontaneously. For a period of time he was treated with Cardizem he no longer takes he is medications.  Echocardiogram 2008 demonstrated left atrial dimension of 49 and moderate mitral regurgitation. He was unaware of these findings.  He denies chest pain or shortness of breath. He does however note over the last 6 months significant fatigue. He has a remote history of sleep apnea. With his above surgery she has lost more than 50 pounds and he snores now only lightly. He denies daytime somnolence. He does however awake unrested.  He has had a significant psychosocial stress and has been recently reunited with his wife fllowing a separation  He has had depression in the past  Thromboembolic risk factors with his atrial fibrillation include diabetes. He was previously on Coumadin   Past Medical History  Diagnosis Date  . Diabetes mellitus   . Sleep apnea   . Paroxysmal atrial fibrillation   . Mitral regurgitation   . Low HDL (under 40)   . Gastritis, acute   . Abdominal pain     recurrent  . Acute gastrojejunal ulcer with perforation but without obstruction   . Diverticula of colon   . Gallstones   . Irritable bowel syndrome   . Bile duct stricture     hx  . Pancreatitis     chronic  . Abnormal liver function  test     hx  . Elevated prostate specific antigen (PSA)   . Anxiety   . Depressed     Past Surgical History  Procedure Date  . Whipple procedure 2000    by Dr. Jacquenette Shone at [facility].- benign stricture in common bile duct, path showed fibrosis & nflammation  . Elap w/ suture of perf jejunal ulcer & drailage of lesser sac abscess 11/04    in [facility]  . F/u ercp 7/05    Dr. Wyline Mood at [facility] with sludge vs stones near junction of CBD & cystic duct, no stone in the GB    Current Outpatient Prescriptions  Medication Sig Dispense Refill  . Pancrelipase, Lip-Prot-Amyl, (CREON) 24000 UNITS CPEP Take 2 capsules by mouth. With meal       . pantoprazole (PROTONIX) 40 MG tablet Take 40 mg by mouth daily.        . sitaGLIPtan-metformin (JANUMET) 50-500 MG per tablet Take 1 tablet by mouth 2 (two) times daily with a meal.  60 tablet  0    Allergies  Allergen Reactions  . Penicillins     REACTION: pt states "lethargy"   Family History  Problem Relation Age of Onset  . Brain cancer Mother   . Asthma Mother   . Emphysema Mother   . Coronary artery disease Father     previous, began in his 33s  \  History  Substance Use Topics  . Smoking status: Unknown If Ever Smoked  . Smokeless tobacco: Not on file   Comment: nonsmoker  . Alcohol Use: No     Review of Systems negative except from HPI and PMH  Physical Exam BP 114/75  Pulse 83  Ht 5\' 11"  (1.803 m)  Wt 187 lb 12.8 oz (85.186 kg)  BMI 26.19 kg/m2 Well developed and well nourished in no acute distress HENT normal E scleral and icterus clear Neck Supple JVP flat; carotids brisk and full Clear to ausculation Regular rate and rhythm, no murmurs gallops or rub Soft with active bowel sounds No clubbing cyanosis none Edema Alert and oriented, grossly normal motor and sensory function Skin Warm and Dry Lymph nodes negative Affect is somewhat flat Musculoskeletal exam within normal Back without kyphosis or  scoliosis  Electro-cardiogram dated today demonstrated sinus rhythm and no ST-T changes   Assessment and  Plan

## 2011-10-09 NOTE — Assessment & Plan Note (Signed)
As above.

## 2011-10-09 NOTE — Assessment & Plan Note (Signed)
We need to reassess his mitral regurgitation; we'll get an echo. This may be contributing to his fatigue and exercise intolerance.

## 2011-10-09 NOTE — Assessment & Plan Note (Signed)
Have encouraged him to pursue ongoing therapy for this

## 2011-10-09 NOTE — Assessment & Plan Note (Signed)
Given his diabetes and the new onset of fatigue with his history of atrial fibrillation, will undertake Myoview scanning preoperatively.

## 2011-10-09 NOTE — Assessment & Plan Note (Signed)
The patient has paroxysmal atrial fibrillation notably triggered by stress. He has left atrial margin and mitral regurgitation which may be a part in its genesis.  Thromboembolic risk factors are sufficiently low that oral anticoagulation is not indicated.  As relates to the former, we will undertake an echo to look at left atrial dimension and mitral regurgitation.  Therapeutically we will give him a prescription for beta blockers to take beginning about a week prior to his surgery in hopes of decreasing associated adrenergic stimulation

## 2011-10-10 ENCOUNTER — Telehealth: Payer: Self-pay

## 2011-10-10 ENCOUNTER — Encounter (INDEPENDENT_AMBULATORY_CARE_PROVIDER_SITE_OTHER): Payer: BC Managed Care – PPO

## 2011-10-10 DIAGNOSIS — I4891 Unspecified atrial fibrillation: Secondary | ICD-10-CM

## 2011-10-11 NOTE — Telephone Encounter (Signed)
Patient had monitor put on 10/10/2011

## 2011-10-12 ENCOUNTER — Ambulatory Visit (HOSPITAL_COMMUNITY): Payer: BC Managed Care – PPO | Attending: Cardiology | Admitting: Radiology

## 2011-10-12 VITALS — BP 114/76 | Ht 71.0 in | Wt 187.0 lb

## 2011-10-12 DIAGNOSIS — R5381 Other malaise: Secondary | ICD-10-CM | POA: Insufficient documentation

## 2011-10-12 DIAGNOSIS — R002 Palpitations: Secondary | ICD-10-CM | POA: Insufficient documentation

## 2011-10-12 DIAGNOSIS — Z01810 Encounter for preprocedural cardiovascular examination: Secondary | ICD-10-CM | POA: Insufficient documentation

## 2011-10-12 DIAGNOSIS — I4949 Other premature depolarization: Secondary | ICD-10-CM

## 2011-10-12 DIAGNOSIS — R0602 Shortness of breath: Secondary | ICD-10-CM

## 2011-10-12 DIAGNOSIS — Z8249 Family history of ischemic heart disease and other diseases of the circulatory system: Secondary | ICD-10-CM | POA: Insufficient documentation

## 2011-10-12 DIAGNOSIS — I4891 Unspecified atrial fibrillation: Secondary | ICD-10-CM

## 2011-10-12 DIAGNOSIS — E119 Type 2 diabetes mellitus without complications: Secondary | ICD-10-CM | POA: Insufficient documentation

## 2011-10-12 MED ORDER — TECHNETIUM TC 99M TETROFOSMIN IV KIT
11.0000 | PACK | Freq: Once | INTRAVENOUS | Status: AC | PRN
  Administered 2011-10-12: 11 via INTRAVENOUS

## 2011-10-12 MED ORDER — TECHNETIUM TC 99M TETROFOSMIN IV KIT
33.0000 | PACK | Freq: Once | INTRAVENOUS | Status: AC | PRN
  Administered 2011-10-12: 33 via INTRAVENOUS

## 2011-10-12 NOTE — Progress Notes (Signed)
[facility] SITE 3 NUCLEAR MED [address] [place] [address] [phone]  Cardiology Nuclear Med Victor Garrett  Victor Garrett is a 61 y.o. male [id number] 02/16/1951   Nuclear Med Background Indication for Stress Test:  Evaluation for Ischemia and Surgical Clearance: pending abdominal surgery on 10/27/11 at [facility] History: '10 Echo: mod. MR EF: 55-60%, 10/01/03 Myocardial Perfusion Study: EF: 62% (-) ischemia and AFIB Cardiac Risk Factors: Family History - CAD and NIDDM  Symptoms:  Fatigue and Palpitations   Nuclear Pre-Procedure Caffeine/Decaff Intake:  None NPO After: 7:30pm   Lungs:  clear IV 0.9% NS with Angio Cath:  20g  IV Site: R Antecubital x 1, tolerated well IV Started by:  Victor Hong, RN  Chest Size (in):  44 Cup Size: n/a  Height: 5\' 11"  (1.803 m)  Weight:  187 lb (84.823 kg)  BMI:  Body mass index is 26.08 kg/(m^2). Tech Comments:  Patient ran out of janumet one week ago,FBS at 8:00 am was 138.Patient has requested  janumet refilled from pharmacy. Victor Garrett    Nuclear Med Study 1 or 2 day study: 1 day  Stress Test Type:  Stress  Reading MD: Victor Clement, MD  Order Authorizing Provider:  Sherryl Manges, MD  Resting Radionuclide: Technetium 26m Tetrofosmin  Resting Radionuclide Dose: 11 mCi   Stress Radionuclide:  Technetium 24m Tetrofosmin  Stress Radionuclide Dose: 33 mCi           Stress Protocol Rest HR: 73 Stress HR: 150  Rest BP: 114/76 Stress BP: 184  Exercise Time (min): 10:00 METS: 11.70   Predicted Max HR: 159 bpm % Max HR: 94.34 bpm Rate Pressure Product: 78469   Dose of Adenosine (mg):  n/a Dose of Lexiscan: n/a mg  Dose of Atropine (mg): n/a Dose of Dobutamine: n/a mcg/kg/min (at max HR)  Stress Test Technologist: Victor Garrett, EMT-P  Nuclear Technologist:  Victor Garrett, CNMT     Rest Procedure:  Myocardial perfusion imaging was performed at rest 45 minutes following the intravenous administration of  Technetium 12m Tetrofosmin. Rest ECG: NSR  Stress Procedure:  The patient exercised for 10:00.  The patient stopped due to fatigue and denied any chest pain.  There were no significant ST-T wave changes and occ pvcs.  Technetium 15m Tetrofosmin was injected at peak exercise and myocardial perfusion imaging was performed after a brief delay. Stress ECG: No significant ST segment change suggestive of ischemia.  QPS Raw Data Images:  Normal; no motion artifact; normal heart/lung ratio. Stress Images:  Normal homogeneous uptake in all areas of the myocardium. Rest Images:  Normal homogeneous uptake in all areas of the myocardium. Subtraction (SDS):  No evidence of ischemia. Transient Ischemic Dilatation (Normal <1.22):  .88 Lung/Heart Ratio (Normal <0.45):  .27  Quantitative Gated Spect Images QGS EDV:  115 ml QGS ESV:  56 ml QGS cine images:  NL LV Function; NL Wall Motion QGS EF: 52%  Impression Exercise Capacity:  Good exercise capacity. BP Response:  Normal blood pressure response. Clinical Symptoms:  No chest pain. ECG Impression:  No significant ST segment change suggestive of ischemia. Comparison with Prior Nuclear Study: No significant change from previous study  Overall Impression:  Normal stress nuclear study.  No ischemia.  Normal EF 52%.   Victor Garrett

## 2011-10-13 ENCOUNTER — Ambulatory Visit (HOSPITAL_COMMUNITY): Payer: BC Managed Care – PPO | Attending: Cardiovascular Disease | Admitting: Radiology

## 2011-10-13 DIAGNOSIS — R5381 Other malaise: Secondary | ICD-10-CM | POA: Insufficient documentation

## 2011-10-13 DIAGNOSIS — E119 Type 2 diabetes mellitus without complications: Secondary | ICD-10-CM | POA: Insufficient documentation

## 2011-10-13 DIAGNOSIS — I4891 Unspecified atrial fibrillation: Secondary | ICD-10-CM

## 2011-10-13 DIAGNOSIS — I340 Nonrheumatic mitral (valve) insufficiency: Secondary | ICD-10-CM

## 2011-10-23 ENCOUNTER — Telehealth: Payer: Self-pay | Admitting: Internal Medicine

## 2011-10-23 NOTE — Telephone Encounter (Addendum)
Stress,LOV faxed to [facility] @ [phone] & Dr.Pappas @ [phone], Also Mailed to  [facility]  [address] [place] [address]  10/23/11/KM  Fax # for Dr.Pappas Was wrong,called pt back let him know he gave me another # to send records to @ [phone], Stress & LOV were faxed to this #, records  Went through .Marland Kitchen..10/23/11/KM

## 2011-10-24 ENCOUNTER — Telehealth: Payer: Self-pay | Admitting: *Deleted

## 2011-10-24 NOTE — Telephone Encounter (Signed)
[facility] Salvia, MD More Detail >>      [facility] Salvia, MD        Sent: Mon October 16, 2011  3:10 PM    To: Jefferey Pica, RN   I left a message for the patient to call at both his home and cell numbers.     Victor Garrett    MRN: [id number] DOB: 05-Sep-1951     Pt Work: [phone] Pt Home: [phone]           Message     Please Inform Patient that echo and myoview were normal  Thanks

## 2011-11-14 ENCOUNTER — Encounter: Payer: Self-pay | Admitting: *Deleted

## 2011-11-14 NOTE — Telephone Encounter (Signed)
Letter of echo/ myoview/ event results mailed to the patient.

## 2012-01-15 ENCOUNTER — Other Ambulatory Visit (INDEPENDENT_AMBULATORY_CARE_PROVIDER_SITE_OTHER): Payer: BC Managed Care – PPO

## 2012-01-15 ENCOUNTER — Ambulatory Visit (INDEPENDENT_AMBULATORY_CARE_PROVIDER_SITE_OTHER): Payer: BC Managed Care – PPO | Admitting: Pulmonary Disease

## 2012-01-15 ENCOUNTER — Encounter: Payer: Self-pay | Admitting: Pulmonary Disease

## 2012-01-15 VITALS — BP 120/72 | HR 65 | Temp 97.5°F | Ht 71.5 in | Wt 191.0 lb

## 2012-01-15 DIAGNOSIS — K5730 Diverticulosis of large intestine without perforation or abscess without bleeding: Secondary | ICD-10-CM

## 2012-01-15 DIAGNOSIS — K831 Obstruction of bile duct: Secondary | ICD-10-CM

## 2012-01-15 DIAGNOSIS — K861 Other chronic pancreatitis: Secondary | ICD-10-CM

## 2012-01-15 DIAGNOSIS — E786 Lipoprotein deficiency: Secondary | ICD-10-CM

## 2012-01-15 DIAGNOSIS — E119 Type 2 diabetes mellitus without complications: Secondary | ICD-10-CM

## 2012-01-15 DIAGNOSIS — I4891 Unspecified atrial fibrillation: Secondary | ICD-10-CM

## 2012-01-15 DIAGNOSIS — K589 Irritable bowel syndrome without diarrhea: Secondary | ICD-10-CM

## 2012-01-15 LAB — BASIC METABOLIC PANEL
BUN: 13 mg/dL (ref 6–23)
CO2: 26 mEq/L (ref 19–32)
Calcium: 9.1 mg/dL (ref 8.4–10.5)
Chloride: 103 mEq/L (ref 96–112)
Creatinine, Ser: 0.8 mg/dL (ref 0.4–1.5)
GFR: 105.81 mL/min (ref >60.00)
Glucose, Bld: 119 mg/dL — ABNORMAL HIGH (ref 70–99)
Potassium: 4.7 mEq/L (ref 3.5–5.1)
Sodium: 138 mEq/L (ref 135–145)

## 2012-01-15 LAB — HEMOGLOBIN A1C: Hgb A1c MFr Bld: 6.5 % (ref 4.6–6.5)

## 2012-01-15 MED ORDER — SITAGLIPTIN PHOS-METFORMIN HCL 50-500 MG PO TABS: 1.0000 | ORAL_TABLET | Freq: Two times a day (BID) | ORAL | Status: DC

## 2012-01-15 NOTE — Patient Instructions (Signed)
Today we updated your med list in our EPIC system...    Continue your current medications the same...    We refilled your Janumet today...  Today we did your follow up diabetic labs...    We will call you w/ these results when avail...  Gradually increase your exercise program...  Call for any questions...  Let's plan a follow up visit w/ FASTING blood work in 4-6 months.Marland KitchenMarland Kitchen

## 2012-01-15 NOTE — Progress Notes (Signed)
Subjective:     Patient ID: Victor Garrett, male   DOB: Mar 20, 1951, 61 y.o.   MRN: [id number]  HPI 61 y/o WM here for a follow up visit... he has multiple medical problems as noted below... he is also followed by DrBuccini for GI...  ~  June 22, 2009:  he was hosp 8/10 by CCM & disch by DrSimonds- summary pending... avail records indicate adm w/ abd pain & eval revealed gallstones, elevated LFTs- seen by DrBucinni for GI & DrHoxsworth for CCS- they didn't feel ERCP poss due to his prev Whipple surg in 2000 & they rec ret to [facility] to see DrPappas... he improved w/ antibiotics... since disch he has seen DrPappas & ?needs additional surg ?reconstruct CBD? sched for 08/13/09... in addition he had PAF w/ meds adjusted & ret to NSR, but is now on Coumadin followed in the [facility]... today he notes depression & we discussed trial of SSRI- Lexapro...  ~  May 09, 2010:  Victor Garrett decided against surg & had 2nd opinion at [facility] in [place] & they advised against surg at this time... no flair in his GI/ biliary prob over the last yr... increased stress as he & wife are now separated... he has lost 21# down to 177# now... he was taking Lexapro but decided to stop 2 weeks ago & feeling OK so far... followed by DrOttelin for Urology w/ elev PSA- neg bx 12/09 & he continues to follow every 59mo... saw DrCrenshaw for Cards 5/11- Hx PAF, maintaining NSR, OK to stop coumadin & switched to ASA 81mg /d...   ~  January 15, 2012:  1mo ROV & there is a lot to get caught up on since last OV here> most recently Victor Garrett had additional abd surg at [facility] 1/13 by DrPappas for biliary outflow dysfunction w/ ELap, cholecystectomy, & hepaticojejunostomy; post op course was uneventful other than an episode of AFib that spont converted back to NSR & he was disch on Amiodarone & Metoprolol;  He was re-admitted 2/13 for an abd fluid collection & had this drained percutaneously (tube in RUQ) w/ transient hypotension==> cultures grew EColi,  Enterobacter, & Clostridia perfringens; he was treated w/ Cipro, Flagyl, Vanco and made a full recovery, drains removed via [facility] surg clinic etc; he is recovering nicely at this point...  See prob list updated below>> DrKlein did preoperative assessment due to his Hx of AFib & MR> ok for surg... He was seen by [facility], DrSchocken, & will be following up w/ him in their clinic...  Baseline EKG is wnl- NSR, rate78, no STTW abnormalities...  Event monitor 1/13 for 14d showed predom NSR w/ freq PACs but no AFib etc...  2DEcho 1/13 showed norm LVF w/ EF=55-60% & no wall motion abn, trivial AI & MR, mild LAdil at 42mm...  Myoview 1/13 was a neg study- no ischemia, no infarct, norm LVF w/ EF=52% & no wall motion abn; no CP & no EKG changes... There is one Urology clinic note in our EMR> 2/12 DrOttelin, hx BPH & BOO, elev PSA & neg bx 12/09, not on medication & they planned continued follow up every 62mo but we don't have further notes... LABS 4/13:  Chems- wnl x BS=119 A1c=6.5   Problem List:     << PROBLEM LIST UPDATED 01/15/12 >>  SLEEP APNEA, MILD (ICD-780.57) - sleep study 1998 w/ RDI 14 & lowest sat 82%;  trial CPAP dc'd by pt; seen by DrClance & DrShoemaker;  sx improved w/ wt loss &  currently denies sleep disordered breathing, no snoring, no daytime symptoms, etc...  PAROXYSMAL ATRIAL FIBRILLATION (ICD-427.31) - Hx recurrent PAF transiently after surg on mult occas in past; Treated w/ transient Diltiazem, Coumadin, Amiodarone in the past; Now supposed to be on ASA 81mg  & METOPROLOL 25mg Bid & followed by [facility]... ~  baseline EKG w/ NSR, WNL ~  2DEcho 12/04 w/ norm LVF, trace MR/ TR ~  Cardiolite 12/04 neg w/ EF= 62% ~  8/10 hosp- initial EKG= NSR, WNL;  subseq AFib w/ rvr... 2DEcho showed norm LV w/ EF= 55-60%, no wall motion abn, redundant atrial septum, mild dil LA= 49mm, mod MR... ~  5/11:  f/u DrCrenshaw & he stopped the Coumadin w/ switch to ASA 81mg /d. ~  1/13:  Pre-op Cards eval  by DrKlein>  Baseline EKG is wnl- NSR, rate78, no STTW abnormalities... Event monitor 1/13 for 14d showed predom NSR w/ freq PACs but no AFib etc... 2DEcho 1/13 showed norm LVF w/ EF=55-60% & no wall motion abn, trivial AI & MR, mild LAdil at 42mm... Myoview 1/13 was a neg study- no ischemia, no infarct, norm LVF w/ EF=52% & no wall motion abn; no CP & no EKG changes... ~  2/13:  He had transient AFib after surg 1/13 & w/ percut drainage of abscess; converted spont to NSR & now maintained on Metoprolol 25mg Bid...  LOW HDL (ICD-272.5) - he's been trying to control w/ diet + exercise... ~  FLP 7/07 showed TChol 114, TG 92, HDL 29, LDL 67... ~  FLP 10/09 showed TChol 165, TG 166, HDL 37, LDL 95 ~  we discussed checking FASTING labs on return visit...  DIABETES MELLITUS, TYPE II (ICD-250.00) - now on JANUMET 50-500 Bid... there is a family hx of diabetes, and he had CBD stricture w/ Whipple procedure 2000...  ~  labs 7/07 showed BS= 113, HgA1c= 5.7 ~  labs 10/09 showed BS= 126 ~  labs 6/10 showed BS= 148, A1c= 8.0.Marland Kitchen. rec> start JANUMET 50-500 Bid... ~  labs 8/10 in hosp showed BS=90-130s & A1c= 6.2 ~  labs 5/11 showed BS= 140, A1c= 6.2 ~  labs 8/11 showed BS= 88, A1c= 6.2 ~  Labs 4/13 on Janumet5/500Bid showed BS= 119, A1c= 6.5.Marland KitchenMarland Kitchen Continue same.  ABDOMINAL PAIN, RECURRENT (ICD-789.00) - long complex hx starting in 2000 w/ abd pain, benign CBD stricture w/ Whipple procedure at [facility] in 2000;  subseq perf jejunal anastomotic ulcer w/ surg in [place] in 2005;  chr pancreatitis w/ recurrent abd pain & elevated LFT's (hosp in 2008 by GI);  gallstones w/ neg cholangitis work up - all followed by DrBuccini==> referred back to [facility], DrPappas. ~  Hosp 8/10 w/ abd pain, nausea, elevated LFTs... gallstones, ?cholecystitis- Rx antibiotics & sent back to [facility]... DrPappas planned surg to reconstruct CBD but pt was reluctant... had 2nd opin at [facility] in [place] & they advised against surg at this time... ~  8/11:   biliary & GI problems have been quiet for the past yr, LFT's normal 5/11... ~  1/13:  Pt returned to [facility] w/ further surg by DrPappas for biliary outflow dysfunction w/ ELap, cholecystectomy, & hepaticojejunostomy; developed a RUQ abscess that was drained percut 2/13 & treated w/ IV antibiotics=> all resolved...  GASTRITIS, ACUTE (ICD-535.00) - on PROTONIX 40mg /d...last EGD 2/06 by DrBuccini showed hemorrhagic gastritis... Hx of ULCER, ACUTE GASTROJEJUNAL W/PERF W/O OBST (ICD-534.10) - EGD in 2004 showed a perforated jejunal ulcer that was oversewn, and a patent gastrojejunostomy... 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 PANCREATITIS, CHRONIC (ICD-577.1) - on LIPRAM 2 tabs Bid... DIVERTICULOSIS, ASYMPTOMATIC (ICD-562.10) - colonoscopies 11/01 & 2/06 by DrBuccini w/o other abnormalities seen... IRRITABLE BOWEL SYNDROME (ICD-564.1)  Hx of LIVER FUNCTION TESTS, ABNORMAL (ICD-794.8) -  ~  labs 9/07 showed AlkPhos 84, SGOT 43, SGPT 75 ~  labs 10/09 showed AlkPhos 86, SGOT 30, SGPT 59 ~  labs 6/10 showed AlkPhos 47, SGOT 21, SGPT 19 ~  labs 8/10 hosp admit showed AlkPhos 189, SGOT 885, SGPT 605 & improved over 5d- AlkPhos 109 SGOT 40, SGPT 185. ~  labs 5/11 showed normal LFTs- AlkPhos 46, SGOT 13, SGPT 10  ELEVATED PROSTATE SPECIFIC ANTIGEN (ICD-790.93) - followed by DrOttelin for Urology w/ elev PSA- neg bx 12/09 & he continues to follow every 34mo...   ANXIETY (ICD-300.00)& DEPRESSION - mult stressors and he's been feeling down since the 8/10 hosp... now separated & going through divorce...we discussed Rx w/ LEXAPRO 10mg /d & he stopped on his own 7/11...   Past Surgical History  Procedure Date  . Whipple procedure 2000    by Dr. Jacquenette Shone at [facility].- benign stricture in common bile duct, path showed fibrosis & nflammation  . Elap w/ suture of perf jejunal ulcer & drailage of lesser sac abscess 11/04    in [facility]  . F/u ercp 7/05    Dr. Wyline Mood  at [facility] with sludge vs stones near junction of CBD & cystic duct, no stone in the GB    Outpatient Encounter Prescriptions as of 01/15/2012  Medication Sig Dispense Refill  . amiodarone (PACERONE) 200 MG tablet Take 200 mg by mouth daily.      . Pancrelipase, Lip-Prot-Amyl, (CREON) 24000 UNITS CPEP Take 2 capsules by mouth. With meal       . pantoprazole (PROTONIX) 40 MG tablet Take 40 mg by mouth daily.        . sitaGLIPtan-metformin (JANUMET) 50-500 MG per tablet Take 1 tablet by mouth 2 (two) times daily with a meal.  60 tablet  0  . DISCONTD: metoprolol tartrate (LOPRESSOR) 25 MG tablet Take 1 tablet (25 mg total) by mouth 2 (two) times daily.  60 tablet  11    Allergies  Allergen Reactions  . Penicillins     REACTION: pt states "lethargy"    Current Medications, Allergies, Past Medical History, Past Surgical History, Family History, and Social History were reviewed in [employer] record.   Review of Systems        See HPI - all other systems neg except as noted...       The patient complains of weight loss and dyspnea on exertion.  The patient denies anorexia, fever, weight gain, vision loss, decreased hearing, hoarseness, chest pain, syncope, peripheral edema, prolonged cough, headaches, hemoptysis, abdominal pain, melena, hematochezia, severe indigestion/heartburn, hematuria, incontinence, muscle weakness, suspicious skin lesions, transient blindness, difficulty walking, depression, unusual weight change, abnormal bleeding, enlarged lymph nodes, and angioedema.     Objective:   Physical Exam     WD, WN, 61 y/o WM in NAD.Marland Kitchen.  sl depressed mood is evident... GENERAL:  Alert & oriented; pleasant & cooperative... HEENT:  [place]/AT, PERRLA, EOM- full, EACs-clear, TMs-wnl, NOSE- wnl, THROAT-clear & wnl. NECK:  Supple w/ fairROM; no JVD; normal carotid impulses w/o bruits; no thyromegaly or nodules palpated; no lymphadenopathy. CHEST:  Clear to P & A, no wheezing/  rales/ rhonchi... HEART:  regular rhythm, gr 1-2 MR, no rubs or gallops detected.Marland KitchenMarland Kitchen  ABDOMEN:  Soft & nontender; same surg scar; normal bowel sounds; no organomegaly or masses palpated  EXT: without deformities, mild arthritic changes; no varicose veins/ venous insuffic/ or edema. DERM:  he has mild vitiligo, no lesions seen...  RADIOLOGY DATA:  Reviewed in the EPIC EMR & discussed w/ the patient...  LABORATORY DATA:  Reviewed in the EPIC EMR & discussed w/ the patient...   Assessment:     Hx PAF>  See extensive pre-op cards eval by DrKlein 1/13; he had PAF post op & [facility] & seek by DrSchocken on Amio & Metoprolol; Amio is out now & holding NSR on Metoprolol...  Low HDL Cholesterol>  We discussed the need for f/u FLP when he is ready...  DM>  On Janumet 50-500Bid & A1c= 6.5'  Continue same med + diet/ exercise...  Biliary Tract Disease>  On PANCREASE Enz 2 Tid w/ meals; he had Whipple in 2000 for benign stricture; and 1/13 had ELap, cholecystectomy, & hepaticojejunostomy for biliary outflow dysfunction;> all [facility] DrPappas at [facility]...  Other GI problems as noted above>  On PROTONIX;  DrBuccini is his local GI...  Hx BPH, BOO, Elev PSA>  All followed by DrOttelin for Urology...  Anxiety & Depression>  Not currently on meds, he stopped his prev Lexapro...     Plan:     Patient's Medications  New Prescriptions   CHOLECALCIFEROL (VITAMIN D) 1000 UNITS TABLET    Take 6 tablets by mouth daily  Previous Medications   AMIODARONE (PACERONE) 200 MG TABLET    Take 200 mg by mouth daily.   PANCRELIPASE, LIP-PROT-AMYL, (CREON) 24000 UNITS CPEP    Take 2 capsules by mouth. With meal    PANTOPRAZOLE (PROTONIX) 40 MG TABLET    Take 40 mg by mouth daily.    Modified Medications   Modified Medication Previous Medication   SITAGLIPTAN-METFORMIN (JANUMET) 50-500 MG PER TABLET sitaGLIPtan-metformin (JANUMET) 50-500 MG per tablet      Take 1 tablet by mouth 2 (two) times daily with a meal.    Take 1  tablet by mouth 2 (two) times daily with a meal.  Discontinued Medications   METOPROLOL TARTRATE (LOPRESSOR) 25 MG TABLET    Take 1 tablet (25 mg total) by mouth 2 (two) times daily.

## 2012-01-16 ENCOUNTER — Other Ambulatory Visit: Payer: Self-pay | Admitting: Pulmonary Disease

## 2012-01-16 DIAGNOSIS — E559 Vitamin D deficiency, unspecified: Secondary | ICD-10-CM | POA: Insufficient documentation

## 2012-01-16 MED ORDER — VITAMIN D 1000 UNITS PO TABS: ORAL_TABLET | ORAL | Status: AC

## 2012-05-20 ENCOUNTER — Ambulatory Visit: Payer: BC Managed Care – PPO | Admitting: Pulmonary Disease

## 2012-06-24 ENCOUNTER — Ambulatory Visit (INDEPENDENT_AMBULATORY_CARE_PROVIDER_SITE_OTHER): Payer: BC Managed Care – PPO | Admitting: Pulmonary Disease

## 2012-06-24 ENCOUNTER — Encounter: Payer: Self-pay | Admitting: Pulmonary Disease

## 2012-06-24 VITALS — BP 110/76 | HR 70 | Temp 98.9°F | Ht 71.5 in | Wt 196.4 lb

## 2012-06-24 DIAGNOSIS — Z23 Encounter for immunization: Secondary | ICD-10-CM

## 2012-06-24 DIAGNOSIS — E119 Type 2 diabetes mellitus without complications: Secondary | ICD-10-CM

## 2012-06-24 DIAGNOSIS — K831 Obstruction of bile duct: Secondary | ICD-10-CM

## 2012-06-24 DIAGNOSIS — K861 Other chronic pancreatitis: Secondary | ICD-10-CM

## 2012-06-24 DIAGNOSIS — F411 Generalized anxiety disorder: Secondary | ICD-10-CM

## 2012-06-24 DIAGNOSIS — K2900 Acute gastritis without bleeding: Secondary | ICD-10-CM

## 2012-06-24 DIAGNOSIS — E786 Lipoprotein deficiency: Secondary | ICD-10-CM

## 2012-06-24 DIAGNOSIS — R972 Elevated prostate specific antigen [PSA]: Secondary | ICD-10-CM

## 2012-06-24 DIAGNOSIS — I4891 Unspecified atrial fibrillation: Secondary | ICD-10-CM

## 2012-06-24 DIAGNOSIS — K589 Irritable bowel syndrome without diarrhea: Secondary | ICD-10-CM

## 2012-06-24 DIAGNOSIS — K8020 Calculus of gallbladder without cholecystitis without obstruction: Secondary | ICD-10-CM

## 2012-06-24 DIAGNOSIS — K5730 Diverticulosis of large intestine without perforation or abscess without bleeding: Secondary | ICD-10-CM

## 2012-06-24 DIAGNOSIS — E559 Vitamin D deficiency, unspecified: Secondary | ICD-10-CM

## 2012-06-24 NOTE — Patient Instructions (Addendum)
Today we updated your med list in our EPIC system...    Continue your current medications the same...  Today we did your follow up blood work...    We will call you w/ the results when avail...  For the intermittent abd discomfort>    You should f/u w/ DrBuccini or at [facility] to keep them informed of your progress...  Watch your intake, & continue your exercise...  Call for any questions...  Let's continue our 6 month follow up visits.Marland KitchenMarland Kitchen

## 2012-06-24 NOTE — Progress Notes (Signed)
Subjective:     Patient ID: Victor Garrett, male   DOB: October 02, 1951, 61 y.o.   MRN: [id number]  HPI 61 y/o WM here for a follow up visit... he has multiple medical problems as noted below... he is also followed by DrBuccini for GI...  ~  June 22, 2009:  he was hosp 8/10 by CCM & disch by DrSimonds- summary pending... avail records indicate adm w/ abd pain & eval revealed gallstones, elevated LFTs- seen by DrBucinni for GI & DrHoxsworth for CCS- they didn't feel ERCP poss due to his prev Whipple surg in 2000 & they rec ret to [facility] to see DrPappas... he improved w/ antibiotics... since disch he has seen DrPappas & ?needs additional surg ?reconstruct CBD? sched for 08/13/09... in addition he had PAF w/ meds adjusted & ret to NSR, but is now on Coumadin followed in the [facility]... today he notes depression & we discussed trial of SSRI- Lexapro...  ~  May 09, 2010:  Victor Garrett decided against surg & had 2nd opinion at [facility] in [place] & they advised against surg at this time... no flair in his GI/ biliary prob over the last yr... increased stress as he & wife are now separated... he has lost 21# down to 177# now... he was taking Lexapro but decided to stop 2 weeks ago & feeling OK so far... followed by DrOttelin for Urology w/ elev PSA- neg bx 12/09 & he continues to follow every 52mo... saw DrCrenshaw for Cards 5/11- Hx PAF, maintaining NSR, OK to stop coumadin & switched to ASA 81mg /d...   ~  January 15, 2012:  72mo ROV & there is a lot to get caught up on since last OV here> most recently Victor Garrett had additional abd surg at [facility] 1/13 by DrPappas for biliary outflow dysfunction w/ ELap, cholecystectomy, & hepaticojejunostomy; post op course was uneventful other than an episode of AFib that spont converted back to NSR & he was disch on Amiodarone & Metoprolol;  He was re-admitted 2/13 for an abd fluid collection & had this drained percutaneously (tube in RUQ) w/ transient hypotension==> cultures grew EColi,  Enterobacter, & Clostridia perfringens; he was treated w/ Cipro, Flagyl, Vanco and made a full recovery, drains removed via [facility] surg clinic etc; he is recovering nicely at this point...  See prob list updated below>> DrKlein did preoperative assessment due to his Hx of AFib & MR> ok for surg... He was seen by [facility], DrSchocken, & will be following up w/ him in their clinic...  Baseline EKG is wnl- NSR, rate78, no STTW abnormalities...  Event monitor 1/13 for 14d showed predom NSR w/ freq PACs but no AFib etc...  2DEcho 1/13 showed norm LVF w/ EF=55-60% & no wall motion abn, trivial AI & MR, mild LAdil at 42mm...  Myoview 1/13 was a neg study- no ischemia, no infarct, norm LVF w/ EF=52% & no wall motion abn; no CP & no EKG changes... There is one Urology clinic note in our EMR> 2/12 DrOttelin, hx BPH & BOO, elev PSA & neg bx 12/09, not on medication & they planned continued follow up every 52mo but we don't have further notes... LABS 4/13:  Chems- wnl x BS=119 A1c=6.5  ~  June 24, 2012:  3mo ROV & Victor Garrett's CC is fatigue, tired, run-down x several months, still plays golf etc; also notes some "stomach issues" eating well, good appetite but get queasy, then shooting pain, lasts a few mins then resolves spont; ?etiology 7 encouraged  to f/u w/ DrBuccini vs [facility] ASAP...    PAF> on Amio200 & followed at [facility]; we don't have notes; pt indicates they stopped his Metoprolol in the interval; he denies CP, palpit, dizzy, SOB, edema; exercise= playing some golf...    Lipids> Hx low HDL on diet & exercise; he hasn't had FLP here in several yrs & reminded to ret fasting for this blood work...    DM> on Janumet50-500Bid; not checking sugars regularly; wt up 5# to 196# today; Labs showed     GI> extensive hx & followed by DrBuccini & at [facility]; on Protonix40 & Pancrease 2 w/ each meal; he's had some recent intermittent abd discomfort & asked to f/u w/ GI ASAP to be sure everything is ok...    GU>  hx elev PSA followed by DrOttelin; PSA=     VitD defic>  He's been on VitD 1000u caps taking 6/d; I cannot find prev VitD levels here in Epic; we checked VitD today=     Anxiety/ Depression> prev on Lexapro but he stopped on his own & feels he's managing satis w/o medication... We reviewed prob list, meds, xrays and labs> see below for updates >> OK 2013 Flu vaccine today... LABS 9/13:  FLP- ok x TG=190 on diet alone;  Chems- ok w/ BS=100 A1c=7.0;  CBC- ok;  TSH=1.37;  PSA=2.60;  VitD=32;      Problem List:     SLEEP APNEA, MILD (ICD-780.57) - sleep study 1998 w/ RDI 14 & lowest sat 82%;  trial CPAP dc'd by pt; seen by DrClance & DrShoemaker;  sx improved w/ wt loss & currently denies sleep disordered breathing, no snoring, no daytime symptoms, etc...  PAROXYSMAL ATRIAL FIBRILLATION (ICD-427.31) - Hx recurrent PAF transiently after surg on mult occas in past; Treated w/ transient Diltiazem, Coumadin, Amiodarone in the past; Now supposed to be on ASA 81mg  & AMIODARONE 200mg /d & followed by [facility]... ~  baseline EKG w/ NSR, WNL ~  2DEcho 12/04 w/ norm LVF, trace MR/ TR ~  Cardiolite 12/04 neg w/ EF= 62% ~  8/10 hosp- initial EKG= NSR, WNL;  subseq AFib w/ rvr... 2DEcho showed norm LV w/ EF= 55-60%, no wall motion abn, redundant atrial septum, mild dil LA= 49mm, mod MR... ~  5/11:  f/u DrCrenshaw & he stopped the Coumadin w/ switch to ASA 81mg /d. ~  1/13:  Pre-op Cards eval by DrKlein>  Baseline EKG is wnl- NSR, rate78, no STTW abnormalities... Event monitor 1/13 for 14d showed predom NSR w/ freq PACs but no AFib etc... 2DEcho 1/13 showed norm LVF w/ EF=55-60% & no wall motion abn, trivial AI & MR, mild LAdil at 42mm... Myoview 1/13 was a neg study- no ischemia, no infarct, norm LVF w/ EF=52% & no wall motion abn; no CP & no EKG changes... ~  2/13:  He had transient AFib after surg 1/13 & w/ percut drainage of abscess; converted spont to NSR & now maintained on Metoprolol 25mg Bid... ~  9/13:   He tells me that [facility] stopped his Metoprolol & continued his Amiodarone 200mg /d...  LOW HDL (ICD-272.5) - he's been trying to control w/ diet + exercise. ~  FLP 7/07 showed TChol 114, TG 92, HDL 29, LDL 67... ~  FLP 10/09 showed TChol 165, TG 166, HDL 37, LDL 95 ~  we discussed checking FASTING labs on return visit... ~  FLP 9/13 showed TChol 173, TG 190, HDL 36, LDL 99... Needs better low fat diet.  DIABETES  MELLITUS, TYPE II (ICD-250.00) - now on JANUMET 50-500 Bid... there is a family hx of diabetes, and he had CBD stricture w/ Whipple procedure 2000...  ~  labs 7/07 showed BS= 113, HgA1c= 5.7 ~  labs 10/09 showed BS= 126 ~  labs 6/10 showed BS= 148, A1c= 8.0.Marland Kitchen. rec> start JANUMET 50-500 Bid... ~  labs 8/10 in hosp showed BS=90-130s & A1c= 6.2 ~  labs 5/11 showed BS= 140, A1c= 6.2 ~  labs 8/11 showed BS= 88, A1c= 6.2 ~  Labs 4/13 on Janumet5/500Bid showed BS= 119, A1c= 6.5.Marland KitchenMarland Kitchen Continue same. ~  Labs 9/13 on Janumet5/500Bid showed BS= 100, A1c= 7.0.Marland KitchenMarland Kitchen Continue same, better diet.  ABDOMINAL PAIN, RECURRENT (ICD-789.00) - long complex hx starting in 2000 w/ abd pain, benign CBD stricture w/ Whipple procedure at [facility] in 2000;  subseq perf jejunal anastomotic ulcer w/ surg in [place] in 2005;  chr pancreatitis w/ recurrent abd pain & elevated LFT's (hosp in 2008 by GI);  gallstones w/ neg cholangitis work up - all followed by DrBuccini==> referred back to [facility], DrPappas. ~  Hosp 8/10 w/ abd pain, nausea, elevated LFTs... gallstones, ?cholecystitis- Rx antibiotics & sent back to [facility]... DrPappas planned surg to reconstruct CBD but pt was reluctant... had 2nd opin at [facility] in [place] & they advised against surg at this time... ~  8/11:  biliary & GI problems have been quiet for the past yr, LFT's normal 5/11... ~  1/13:  Pt returned to [facility] w/ further surg by DrPappas for biliary outflow dysfunction w/ ELap, cholecystectomy, & hepaticojejunostomy; developed a RUQ abscess that was drained percut  2/13 & treated w/ IV antibiotics=> all resolved... ~  9/13:  He tells me he has been back to [facility] once in the interval; now c/o some intermittent sharp pains 7 needs f/u w/ GI- he will call DrBuccini...  GASTRITIS, ACUTE (ICD-535.00) - on PROTONIX 40mg /d...last EGD 2/06 by DrBuccini showed hemorrhagic gastritis... Hx of ULCER, ACUTE GASTROJEJUNAL W/PERF W/O OBST (ICD-534.10) - EGD in 2004 showed a perforated jejunal ulcer that was oversewn, and a patent gastrojejunostomy... 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 Then had recurrent symptoms that required additional surg as noted above... PANCREATITIS, CHRONIC (ICD-577.1) - on LIPRAM 2 tabs Bid... DIVERTICULOSIS, ASYMPTOMATIC (ICD-562.10) - colonoscopies 11/01 & 2/06 by DrBuccini w/o other abnormalities seen... IRRITABLE BOWEL SYNDROME (ICD-564.1)  Hx of LIVER FUNCTION TESTS, ABNORMAL (ICD-794.8) -  ~  labs 9/07 showed AlkPhos 84, SGOT 43, SGPT 75 ~  labs 10/09 showed AlkPhos 86, SGOT 30, SGPT 59 ~  labs 6/10 showed AlkPhos 47, SGOT 21, SGPT 19 ~  labs 8/10 hosp admit showed AlkPhos 189, SGOT 885, SGPT 605 & improved over 5d- AlkPhos 109 SGOT 40, SGPT 185. ~  labs 5/11 showed normal LFTs- AlkPhos 46, SGOT 13, SGPT 10 ~  Labs 5/12 showed elev LFTs once again & he saw DrBuccini & the folks at [facility]... ~  Labs 9/13 showed normal LFTs...  ELEVATED PROSTATE SPECIFIC ANTIGEN (ICD-790.93) - followed by DrOttelin for Urology w/ elev PSA- neg bx 12/09 & he continues to follow every 51mo...  ~  Labs 9/13 showed PSA= 2.60  ANXIETY (ICD-300.00)& DEPRESSION - mult stressors and he's been feeling down since the 8/10 hosp... now separated & going through divorce...we discussed Rx w/ LEXAPRO 10mg /d & he stopped on his own 7/11...   Past Surgical History  Procedure Date  . Whipple procedure 2000    by Dr. Jacquenette Shone  at [facility].- benign stricture in common bile duct, path showed fibrosis & nflammation  . Elap w/  suture of perf jejunal ulcer & drailage of lesser sac abscess 11/04    in [facility]  . F/u ercp 7/05    Dr. Wyline Mood at [facility] with sludge vs stones near junction of CBD & cystic duct, no stone in the GB    Outpatient Encounter Prescriptions as of 06/24/2012  Medication Sig Dispense Refill  . amiodarone (PACERONE) 200 MG tablet Take 200 mg by mouth daily.      . cholecalciferol (VITAMIN D) 1000 UNITS tablet Take 6 tablets by mouth daily  90 tablet  5  . Pancrelipase, Lip-Prot-Amyl, (CREON) 24000 UNITS CPEP Take 2 capsules by mouth. With meal       . pantoprazole (PROTONIX) 40 MG tablet Take 40 mg by mouth daily.        . sitaGLIPtan-metformin (JANUMET) 50-500 MG per tablet Take 1 tablet by mouth 2 (two) times daily with a meal.  60 tablet  11    Allergies  Allergen Reactions  . Penicillins     REACTION: pt states "lethargy"    Current Medications, Allergies, Past Medical History, Past Surgical History, Family History, and Social History were reviewed in [employer] record.   Review of Systems        See HPI - all other systems neg except as noted...       The patient complains of weight loss and dyspnea on exertion.  The patient denies anorexia, fever, weight gain, vision loss, decreased hearing, hoarseness, chest pain, syncope, peripheral edema, prolonged cough, headaches, hemoptysis, abdominal pain, melena, hematochezia, severe indigestion/heartburn, hematuria, incontinence, muscle weakness, suspicious skin lesions, transient blindness, difficulty walking, depression, unusual weight change, abnormal bleeding, enlarged lymph nodes, and angioedema.     Objective:   Physical Exam     WD, WN, 61 y/o WM in NAD.Marland Kitchen.  sl depressed mood is evident... GENERAL:  Alert & oriented; pleasant & cooperative... HEENT:  [place]/AT, PERRLA, EOM- full, EACs-clear, TMs-wnl, NOSE- wnl, THROAT-clear & wnl. NECK:  Supple w/ fairROM; no JVD; normal carotid impulses w/o bruits; no thyromegaly  or nodules palpated; no lymphadenopathy. CHEST:  Clear to P & A, no wheezing/ rales/ rhonchi... HEART:  regular rhythm, gr 1-2 MR, no rubs or gallops detected... ABDOMEN:  Soft & nontender; same surg scar; normal bowel sounds; no organomegaly or masses palpated  EXT: without deformities, mild arthritic changes; no varicose veins/ venous insuffic/ or edema. DERM:  he has mild vitiligo, no lesions seen...  RADIOLOGY DATA:  Reviewed in the EPIC EMR & discussed w/ the patient...  LABORATORY DATA:  Reviewed in the EPIC EMR & discussed w/ the patient...   Assessment:      Hx PAF>  See extensive pre-op cards eval by DrKlein 1/13; he had PAF post op & [facility] & seek by DrSchocken on Amio & Metoprolol; we don't have follow up notes but pt indicates that they stopped his Metoprolol & placed him on Amio200mg /d...  Low HDL Cholesterol>  We discussed the need for f/u FLP when he is ready...  DM>  On Janumet 50-500Bid & A1c was 6.5; Continue same med + diet/ exercise...  Biliary Tract Disease>  On PANCREASE Enz 2 Tid w/ meals; he had Whipple in 2000 for benign stricture; and 1/13 had ELap, cholecystectomy, & hepaticojejunostomy for biliary outflow dysfunction;> all by DrPappas [facility]...  Other GI problems as noted above>  On PROTONIX;  DrBuccini is  his local GI...  Hx BPH, BOO, Elev PSA>  All followed by DrOttelin for Urology...  Anxiety & Depression>  Not currently on meds, he stopped his prev Lexapro...     Plan:     Patient's Medications  New Prescriptions   No medications on file  Previous Medications   AMIODARONE (PACERONE) 200 MG TABLET    Take 200 mg by mouth daily.   CHOLECALCIFEROL (VITAMIN D) 1000 UNITS TABLET    Take 6 tablets by mouth daily   PANCRELIPASE, LIP-PROT-AMYL, (CREON) 24000 UNITS CPEP    Take 2 capsules by mouth. With meal    PANTOPRAZOLE (PROTONIX) 40 MG TABLET    Take 40 mg by mouth daily.     SITAGLIPTAN-METFORMIN (JANUMET) 50-500 MG PER TABLET    Take 1 tablet by  mouth 2 (two) times daily with a meal.  Modified Medications   No medications on file  Discontinued Medications   No medications on file

## 2012-06-25 ENCOUNTER — Other Ambulatory Visit (INDEPENDENT_AMBULATORY_CARE_PROVIDER_SITE_OTHER): Payer: BC Managed Care – PPO

## 2012-06-25 DIAGNOSIS — K5730 Diverticulosis of large intestine without perforation or abscess without bleeding: Secondary | ICD-10-CM

## 2012-06-25 DIAGNOSIS — E786 Lipoprotein deficiency: Secondary | ICD-10-CM

## 2012-06-25 DIAGNOSIS — E119 Type 2 diabetes mellitus without complications: Secondary | ICD-10-CM

## 2012-06-25 DIAGNOSIS — F411 Generalized anxiety disorder: Secondary | ICD-10-CM

## 2012-06-25 DIAGNOSIS — R972 Elevated prostate specific antigen [PSA]: Secondary | ICD-10-CM

## 2012-06-25 DIAGNOSIS — K861 Other chronic pancreatitis: Secondary | ICD-10-CM

## 2012-06-25 DIAGNOSIS — E559 Vitamin D deficiency, unspecified: Secondary | ICD-10-CM

## 2012-06-25 LAB — CBC WITH DIFFERENTIAL/PLATELET
Basophils Absolute: 0 10*3/uL (ref 0.0–0.1)
Basophils Relative: 0.3 % (ref 0.0–3.0)
Eosinophils Absolute: 0.2 10*3/uL (ref 0.0–0.7)
Eosinophils Relative: 1.9 % (ref 0.0–5.0)
HCT: 45.9 % (ref 39.0–52.0)
Hemoglobin: 15.1 g/dL (ref 13.0–17.0)
Lymphocytes Relative: 15.9 % (ref 12.0–46.0)
Lymphs Abs: 1.7 10*3/uL (ref 0.7–4.0)
MCHC: 33 g/dL (ref 30.0–36.0)
MCV: 82.4 fl (ref 78.0–100.0)
Monocytes Absolute: 0.7 10*3/uL (ref 0.1–1.0)
Monocytes Relative: 6.2 % (ref 3.0–12.0)
Neutro Abs: 8 10*3/uL — ABNORMAL HIGH (ref 1.4–7.7)
Neutrophils Relative %: 75.7 % (ref 43.0–77.0)
Platelets: 175 10*3/uL (ref 150.0–400.0)
RBC: 5.57 Mil/uL (ref 4.22–5.81)
RDW: 15.4 % — ABNORMAL HIGH (ref 11.5–14.6)
WBC: 10.6 10*3/uL — ABNORMAL HIGH (ref 4.5–10.5)

## 2012-06-25 LAB — HEMOGLOBIN A1C: Hgb A1c MFr Bld: 7 % — ABNORMAL HIGH (ref 4.6–6.5)

## 2012-06-26 LAB — BASIC METABOLIC PANEL
BUN: 20 mg/dL (ref 6–23)
CO2: 24 mEq/L (ref 19–32)
Calcium: 9.2 mg/dL (ref 8.4–10.5)
Chloride: 103 mEq/L (ref 96–112)
Creatinine, Ser: 1 mg/dL (ref 0.4–1.5)
GFR: 85.4 mL/min (ref >60.00)
Glucose, Bld: 100 mg/dL — ABNORMAL HIGH (ref 70–99)
Potassium: 4.9 mEq/L (ref 3.5–5.1)
Sodium: 139 mEq/L (ref 135–145)

## 2012-06-26 LAB — HEPATIC FUNCTION PANEL
ALT: 20 U/L (ref 0–53)
AST: 19 U/L (ref 0–37)
Albumin: 4.5 g/dL (ref 3.5–5.2)
Alkaline Phosphatase: 65 U/L (ref 39–117)
Bilirubin, Direct: 0.1 mg/dL (ref 0.0–0.3)
Total Bilirubin: 0.6 mg/dL (ref 0.3–1.2)
Total Protein: 7.2 g/dL (ref 6.0–8.3)

## 2012-06-26 LAB — PSA: PSA: 2.6 ng/mL (ref 0.10–4.00)

## 2012-06-26 LAB — LIPID PANEL
Cholesterol: 173 mg/dL (ref 0–200)
HDL: 36.1 mg/dL — ABNORMAL LOW (ref >39.00)
LDL Cholesterol: 99 mg/dL (ref 0–99)
Total CHOL/HDL Ratio: 5
Triglycerides: 190 mg/dL — ABNORMAL HIGH (ref 0.0–149.0)
VLDL: 38 mg/dL (ref 0.0–40.0)

## 2012-06-26 LAB — VITAMIN D 25 HYDROXY (VIT D DEFICIENCY, FRACTURES): Vit D, 25-Hydroxy: 32 ng/mL (ref 30–89)

## 2012-06-26 LAB — TSH: TSH: 1.37 u[IU]/mL (ref 0.35–5.50)

## 2012-12-23 ENCOUNTER — Other Ambulatory Visit (INDEPENDENT_AMBULATORY_CARE_PROVIDER_SITE_OTHER): Payer: BC Managed Care – PPO

## 2012-12-23 ENCOUNTER — Encounter: Payer: Self-pay | Admitting: Pulmonary Disease

## 2012-12-23 ENCOUNTER — Ambulatory Visit (INDEPENDENT_AMBULATORY_CARE_PROVIDER_SITE_OTHER): Payer: BC Managed Care – PPO | Admitting: Pulmonary Disease

## 2012-12-23 VITALS — BP 124/72 | HR 93 | Temp 98.8°F | Ht 71.5 in | Wt 203.6 lb

## 2012-12-23 DIAGNOSIS — E119 Type 2 diabetes mellitus without complications: Secondary | ICD-10-CM

## 2012-12-23 DIAGNOSIS — E559 Vitamin D deficiency, unspecified: Secondary | ICD-10-CM

## 2012-12-23 DIAGNOSIS — E786 Lipoprotein deficiency: Secondary | ICD-10-CM

## 2012-12-23 DIAGNOSIS — R5381 Other malaise: Secondary | ICD-10-CM

## 2012-12-23 DIAGNOSIS — K861 Other chronic pancreatitis: Secondary | ICD-10-CM

## 2012-12-23 DIAGNOSIS — F411 Generalized anxiety disorder: Secondary | ICD-10-CM

## 2012-12-23 DIAGNOSIS — K831 Obstruction of bile duct: Secondary | ICD-10-CM

## 2012-12-23 DIAGNOSIS — K5730 Diverticulosis of large intestine without perforation or abscess without bleeding: Secondary | ICD-10-CM

## 2012-12-23 DIAGNOSIS — K589 Irritable bowel syndrome without diarrhea: Secondary | ICD-10-CM

## 2012-12-23 DIAGNOSIS — I4891 Unspecified atrial fibrillation: Secondary | ICD-10-CM

## 2012-12-23 LAB — BASIC METABOLIC PANEL WITH GFR
BUN: 14 mg/dL (ref 6–23)
CO2: 25 meq/L (ref 19–32)
Calcium: 8.7 mg/dL (ref 8.4–10.5)
Chloride: 102 meq/L (ref 96–112)
Creatinine, Ser: 0.9 mg/dL (ref 0.4–1.5)
GFR: 94.37 mL/min
Glucose, Bld: 167 mg/dL — ABNORMAL HIGH (ref 70–99)
Potassium: 4.1 meq/L (ref 3.5–5.1)
Sodium: 136 meq/L (ref 135–145)

## 2012-12-23 LAB — LIPID PANEL
Cholesterol: 170 mg/dL (ref 0–200)
HDL: 31.1 mg/dL — ABNORMAL LOW (ref >39.00)
LDL Cholesterol: 100 mg/dL — ABNORMAL HIGH (ref 0–99)
Total CHOL/HDL Ratio: 5
Triglycerides: 196 mg/dL — ABNORMAL HIGH (ref 0.0–149.0)
VLDL: 39.2 mg/dL (ref 0.0–40.0)

## 2012-12-23 LAB — HEMOGLOBIN A1C: Hgb A1c MFr Bld: 8.1 % — ABNORMAL HIGH (ref 4.6–6.5)

## 2012-12-23 MED ORDER — PANCRELIPASE (LIP-PROT-AMYL) 24000-76000 UNITS PO CPEP: 2.0000 | ORAL_CAPSULE | Freq: Every day | ORAL | Status: DC

## 2012-12-23 MED ORDER — PANTOPRAZOLE SODIUM 40 MG PO TBEC: 40.0000 mg | DELAYED_RELEASE_TABLET | Freq: Every day | ORAL | Status: DC

## 2012-12-23 MED ORDER — ZOLPIDEM TARTRATE 10 MG PO TABS: 10.0000 mg | ORAL_TABLET | Freq: Every evening | ORAL | Status: DC | PRN

## 2012-12-23 NOTE — Patient Instructions (Addendum)
Today we updated your med list in our EPIC system...    Continue your current medications the same...  We wrote a new prescription for AMBIEN (Zolpidem) 10mg  - take 1/2 to 1 tab as needed for sleep...  We discussed getting into a better exercise routine to help your fatigue...  Today we did your follow up Cholesterol & DM labs...    We will contact you w/ the results when available...   Call for any questions...  Let's continue our 6 month follow up visits.Marland KitchenMarland Kitchen

## 2012-12-23 NOTE — Progress Notes (Signed)
Subjective:     Patient ID: Victor Garrett, male   DOB: 1951-06-01, 62 y.o.   MRN: [id number]  HPI 62 y/o WM here for a follow up visit... he has multiple medical problems as noted below... he is also followed by DrBuccini for GI.Marland Kitchen.  ~  January 15, 2012:  51mo ROV & there is a lot to get caught up on since last OV here> most recently Victor Garrett had additional abd surg at [facility] 1/13 by DrPappas for biliary outflow dysfunction w/ ELap, cholecystectomy, & hepaticojejunostomy; post op course was uneventful other than an episode of AFib that spont converted back to NSR & he was disch on Amiodarone & Metoprolol;  He was re-admitted 2/13 for an abd fluid collection & had this drained percutaneously (tube in RUQ) w/ transient hypotension==> cultures grew EColi, Enterobacter, & Clostridia perfringens; he was treated w/ Cipro, Flagyl, Vanco and made a full recovery, drains removed via [facility] clinic etc; he is recovering nicely at this point...  See prob list updated below>> DrKlein did preoperative assessment due to his Hx of AFib & MR> ok for surg... He was seen by [facility], DrSchocken, & will be following up w/ him in their clinic...  Baseline EKG is wnl- NSR, rate78, no STTW abnormalities...  Event monitor 1/13 for 14d showed predom NSR w/ freq PACs but no AFib etc...  2DEcho 1/13 showed norm LVF w/ EF=55-60% & no wall motion abn, trivial AI & MR, mild LAdil at 42mm...  Myoview 1/13 was a neg study- no ischemia, no infarct, norm LVF w/ EF=52% & no wall motion abn; no CP & no EKG changes... There is one Urology clinic note in our EMR> 2/12 DrOttelin, hx BPH & BOO, elev PSA & neg bx 12/09, not on medication & they planned continued follow up every 1mo but we don't have further notes... LABS 4/13:  Chems- wnl x BS=119 A1c=6.5  ~  June 24, 2012:  31mo ROV & Victor Garrett's CC is fatigue, tired, run-down x several months, still plays golf etc; also notes some "stomach issues" eating well, good appetite but get  queasy, then shooting pain, lasts a few mins then resolves spont; ?etiology & encouraged to f/u w/ [facility]Buccini vs [facility] ASAP...    PAF> on Amio200 & [facility]; we don't have notes; pt indicates they stopped his Metoprolol in the interval; he denies CP, palpit, dizzy, SOB, edema; exercise= playing some golf...    Lipids> Hx low HDL on diet & exercise; he hasn't had FLP here in several yrs & reminded to ret fasting for this blood work...    DM> on Janumet50-500Bid; not checking sugars regularly; wt up 5# to 196# today; Labs showed     GI> extensive hx & followed by DrBuccini & at [facility]; on Protonix40 & Pancrease 2 w/ each meal; he's had some recent intermittent abd discomfort & asked to f/u w/ GI ASAP to be sure everything is ok...    GU> hx elev PSA followed by DrOttelin; PSA=     VitD defic>  He's been on VitD 1000u caps taking 6/d; I cannot find prev VitD levels here in Epic; we checked VitD today=     Anxiety/ Depression> prev on Lexapro but he stopped on his own & feels he's managing satis w/o medication... We reviewed prob list, meds, xrays and labs> see below for updates >> OK 2013 Flu vaccine today... LABS 9/13:  FLP- ok x TG=190 on diet alone;  Chems- ok w/  BS=100 A1c=7.0;  CBC- ok;  TSH=1.37;  PSA=2.60;  VitD=32     ~  December 23, 2012:  15mo ROV & Victor Garrett again notes waning energy, intermittent abd discomfort, gas/ belching, etc; he discussed by phone w/ DrPapas at [facility] & doubled his meds (Protonix & Creon); he hasn't been back to DrBuccini since he told him it was over his head... We reviewed the following medical problems during today's office visit >>      PAF> on Amio200 & followed at [facility] yearly; we don't have notes but he's due now; pt indicates they stopped his Metoprolol- he denies CP, palpit, dizzy, SOB, edema; exercise= playing some golf...    Lipids> Hx low HDL on diet & exercise; FLP 3/14 shows TChol 170, TG 196, HDL 31, LDL 100; we reviewed low fat diet & exercise  program...    DM> on Janumet50-500Bid; not checking sugars regularly; wt= up 8# to 204# today; Labs showed BS=167, A1c=8.1, and rec to ADD GLIMEP2mg  Qam...    GI> extensive hx & followed by DrBuccini & at [facility]; on Protonix40 & Pancrease 2 w/ each meal; he's had some recent intermittent abd discomfort & asked to f/u w/ GI ASAP to be sure everything is ok...    GU> hx elev PSA followed by DrOttelin; last PSA here was 9/13 = 2.60    VitD defic>  He's been on VitD 1000u caps taking 6/d; labs 9/13 showed Vit d = 32... Continue same.    Anxiety/ Depression> prev on Lexapro but he stopped on his own & feels he's managing satis w/o medication; but c/o insomnia- wakes at 3am & can't get back to sleep.  We reviewed prob list, meds, xrays and labs> see below for updates >>  LABS 3/14:  FLP- ok on diet alone x TG=196, HDL=31;  Chems- ok x BS=167, A1c=8.1 on Janumet50-500Bid...          Problem List:     SLEEP APNEA, MILD (ICD-780.57) - sleep study 1998 w/ RDI 14 & lowest sat 82%;  trial CPAP dc'd by pt; seen by DrClance & DrShoemaker;  sx improved w/ wt loss & currently denies sleep disordered breathing, no snoring, no daytime symptoms, etc...  PAROXYSMAL ATRIAL FIBRILLATION (ICD-427.31) - Hx recurrent PAF transiently after surg on mult occas in past; Treated w/ transient Diltiazem, Coumadin, Amiodarone in the past; Now supposed to be on ASA 81mg  & AMIODARONE 200mg /d & followed by [facility]... ~  baseline EKG w/ NSR, WNL ~  2DEcho 12/04 w/ norm LVF, trace MR/ TR ~  Cardiolite 12/04 neg w/ EF= 62% ~  8/10 hosp- initial EKG= NSR, WNL;  subseq AFib w/ rvr... 2DEcho showed norm LV w/ EF= 55-60%, no wall motion abn, redundant atrial septum, mild dil LA= 49mm, mod MR... ~  5/11:  f/u DrCrenshaw & he stopped the Coumadin w/ switch to ASA 81mg /d. ~  1/13:  Pre-op Cards eval by DrKlein>  Baseline EKG is wnl- NSR, rate78, no STTW abnormalities... Event monitor 1/13 for 14d showed predom NSR w/ freq PACs  but no AFib etc... 2DEcho 1/13 showed norm LVF w/ EF=55-60% & no wall motion abn, trivial AI & MR, mild LAdil at 42mm... Myoview 1/13 was a neg study- no ischemia, no infarct, norm LVF w/ EF=52% & no wall motion abn; no CP & no EKG changes... ~  2/13:  He had transient AFib after surg 1/13 & w/ percut drainage of abscess; converted spont to NSR & now maintained on Metoprolol  25mg Bid... ~  9/13:  He tells me that [facility] stopped his Metoprolol & continued his Amiodarone 200mg /d... ~  3/14:  on Amio200 & followed at [facility] yearly; rhythm remains regular, we don't have notes but he's due now; pt indicates they stopped his Metoprolol- he denies CP, palpit, dizzy, SOB, edema; exercise= playing some golf..  LOW HDL (ICD-272.5) - he's been trying to control w/ diet + exercise. ~  FLP 7/07 showed TChol 114, TG 92, HDL 29, LDL 67... ~  FLP 10/09 showed TChol 165, TG 166, HDL 37, LDL 95 ~  we discussed checking FASTING labs on return visit... ~  FLP 9/13 showed TChol 173, TG 190, HDL 36, LDL 99... Needs better low fat diet. ~  FLP 3/14 on diet alone showed TChol 170, TG 196, HDL 31, LDL 100   DIABETES MELLITUS, TYPE II (ICD-250.00) - now on JANUMET 50-500 Bid... there is a family hx of diabetes, and he had CBD stricture w/ Whipple procedure 2000...  ~  labs 7/07 showed BS= 113, HgA1c= 5.7 ~  labs 10/09 showed BS= 126 ~  labs 6/10 showed BS= 148, A1c= 8.0.Marland Kitchen. rec> start JANUMET 50-500 Bid... ~  labs 8/10 in hosp showed BS=90-130s & A1c= 6.2 ~  labs 5/11 showed BS= 140, A1c= 6.2 ~  labs 8/11 showed BS= 88, A1c= 6.2 ~  Labs 4/13 on Janumet50/500Bid showed BS= 119, A1c= 6.5.Marland KitchenMarland Kitchen Continue same. ~  Labs 9/13 on Janumet50/500Bid showed BS= 100, A1c= 7.0.Marland KitchenMarland Kitchen Continue same, better diet. ~  Labs 3/14 on Janumet50/500Bid showed BS= 167, A1c= 8.1.Marland KitchenMarland Kitchen Decided to ADD GLIMEPIRIDE 2mg  Qam...  ABDOMINAL PAIN, RECURRENT (ICD-789.00) - long complex hx starting in 2000 w/ abd pain, benign CBD stricture w/ Whipple procedure  at [facility] in 2000;  subseq perf jejunal anastomotic ulcer w/ surg in [place] in 2005;  chr pancreatitis w/ recurrent abd pain & elevated LFT's (hosp in 2008 by GI);  gallstones w/ neg cholangitis work up - all followed by DrBuccini==> referred back to [facility], DrPappas. ~  Hosp 8/10 w/ abd pain, nausea, elevated LFTs... gallstones, ?cholecystitis- Rx antibiotics & sent back to [facility]... DrPappas planned surg to reconstruct CBD but pt was reluctant... had 2nd opin at [facility] in [place] & they advised against surg at this time... ~  8/11:  biliary & GI problems have been quiet for the past yr, LFT's normal 5/11... ~  1/13:  Pt returned to [facility] w/ further surg by DrPappas for biliary outflow dysfunction w/ ELap, cholecystectomy, & hepaticojejunostomy; developed a RUQ abscess that was drained percut 2/13 & treated w/ IV antibiotics=> all resolved... ~  9/13:  He tells me he has been back to [facility] once in the interval; now c/o some intermittent sharp pains 7 needs f/u w/ GI- he will call DrBuccini... ~  3/14: c/o intermittent abd discomfort, gas/ belching, etc; he discussed by phone w/ DrPapas at [facility] & doubled his meds (Protonix & Creon); he hasn't been back to DrBuccini since he told him it was over his head.  GASTRITIS, ACUTE (ICD-535.00) - on PROTONIX 40mg /d...last EGD 2/06 by DrBuccini showed hemorrhagic gastritis... Hx of ULCER, ACUTE GASTROJEJUNAL W/PERF W/O OBST (ICD-534.10) - EGD in 2004 showed a perforated jejunal ulcer that was oversewn, and a patent gastrojejunostomy... 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 Then had recurrent symptoms that required additional surg as noted above... PANCREATITIS, CHRONIC (ICD-577.1) - on LIPRAM 2 tabs Bid... DIVERTICULOSIS, ASYMPTOMATIC (ICD-562.10) - colonoscopies 11/01 &  2/06 by DrBuccini w/o other abnormalities seen... IRRITABLE BOWEL SYNDROME (ICD-564.1)  Hx of LIVER FUNCTION TESTS, ABNORMAL (ICD-794.8) -  ~  labs 9/07  showed AlkPhos 84, SGOT 43, SGPT 75 ~  labs 10/09 showed AlkPhos 86, SGOT 30, SGPT 59 ~  labs 6/10 showed AlkPhos 47, SGOT 21, SGPT 19 ~  labs 8/10 hosp admit showed AlkPhos 189, SGOT 885, SGPT 605 & improved over 5d- AlkPhos 109 SGOT 40, SGPT 185. ~  labs 5/11 showed normal LFTs- AlkPhos 46, SGOT 13, SGPT 10 ~  Labs 5/12 showed elev LFTs once again & he saw DrBuccini & the folks at [facility]... ~  Labs 9/13 showed normal LFTs...  ELEVATED PROSTATE SPECIFIC ANTIGEN (ICD-790.93) - followed by DrOttelin for Urology w/ elev PSA- neg bx 12/09 & he continues to follow every 63mo...  ~  Labs 9/13 showed PSA= 2.60  ANXIETY (ICD-300.00)& DEPRESSION - mult stressors and he's been feeling down since the 8/10 hosp... now separated & going through divorce...we discussed Rx w/ LEXAPRO 10mg /d & he stopped on his own 7/11...   Past Surgical History  Procedure Laterality Date  . Whipple procedure  2000    by Dr. Jacquenette Shone at [facility].- benign stricture in common bile duct, path showed fibrosis & nflammation  . Elap w/ suture of perf jejunal ulcer & drailage of lesser sac abscess  11/04    in [facility]  . F/u ercp  7/05    Dr. Wyline Mood at [facility] with sludge vs stones near junction of CBD & cystic duct, no stone in the GB    Outpatient Encounter Prescriptions as of 12/23/2012  Medication Sig Dispense Refill  . amiodarone (PACERONE) 200 MG tablet Take 200 mg by mouth daily.      . cholecalciferol (VITAMIN D) 1000 UNITS tablet Take 6 tablets by mouth daily  90 tablet  5  . Pancrelipase, Lip-Prot-Amyl, (CREON) 24000 UNITS CPEP Take 2 capsules by mouth. With meal       . pantoprazole (PROTONIX) 40 MG tablet Take 40 mg by mouth daily.        . sitaGLIPtan-metformin (JANUMET) 50-500 MG per tablet Take 1 tablet by mouth 2 (two) times daily with a meal.  60 tablet  11   No facility-administered encounter medications on file as of 12/23/2012.    Allergies  Allergen Reactions  . Penicillins     REACTION: pt states "lethargy"     Current Medications, Allergies, Past Medical History, Past Surgical History, Family History, and Social History were reviewed in [employer] record.   Review of Systems        See HPI - all other systems neg except as noted...       The patient complains of weight loss and dyspnea on exertion.  The patient denies anorexia, fever, weight gain, vision loss, decreased hearing, hoarseness, chest pain, syncope, peripheral edema, prolonged cough, headaches, hemoptysis, abdominal pain, melena, hematochezia, severe indigestion/heartburn, hematuria, incontinence, muscle weakness, suspicious skin lesions, transient blindness, difficulty walking, depression, unusual weight change, abnormal bleeding, enlarged lymph nodes, and angioedema.     Objective:   Physical Exam     WD, WN, 62 y/o WM in NAD.Marland Kitchen.  sl depressed mood is evident... GENERAL:  Alert & oriented; pleasant & cooperative... HEENT:  [place]/AT, PERRLA, EOM- full, EACs-clear, TMs-wnl, NOSE- wnl, THROAT-clear & wnl. NECK:  Supple w/ fairROM; no JVD; normal carotid impulses w/o bruits; no thyromegaly or nodules palpated; no lymphadenopathy. CHEST:  Clear to P & A, no  wheezing/ rales/ rhonchi... HEART:  regular rhythm, gr 1-2 MR, no rubs or gallops detected... ABDOMEN:  Soft & nontender; same surg scar; normal bowel sounds; no organomegaly or masses palpated  EXT: without deformities, mild arthritic changes; no varicose veins/ venous insuffic/ or edema. DERM:  he has mild vitiligo, no lesions seen...  RADIOLOGY DATA:  Reviewed in the EPIC EMR & discussed w/ the patient...  LABORATORY DATA:  Reviewed in the EPIC EMR & discussed w/ the patient...   Assessment:      Hx PAF>  See extensive pre-op cards eval by DrKlein 1/13; he had PAF post op & [facility] & seek by DrSchocken on Amio & Metoprolol; we don't have follow up notes but pt indicates that they stopped his Metoprolol & placed him on Amio200mg /d...  Low HDL  Cholesterol>  We discussed the need for f/u FLP when he is ready...  DM>  On Janumet 50-500Bid & A1c is up to 8.1; desperately needs to lose the wt- add Glimep2mg Qam...  Biliary Tract Disease>  On PANCREASE Enz 2 Tid w/ meals; he had Whipple in 2000 for benign stricture; and 1/13 had ELap, cholecystectomy, & hepaticojejunostomy for biliary outflow dysfunction;> all by DrPappas at [facility]...  Other GI problems as noted above>  On PROTONIX;  DrBuccini is his local GI...  Hx BPH, BOO, Elev PSA>  All followed by DrOttelin for Urology...  Anxiety & Depression>  Not currently on meds, he stopped his prev Lexapro...     Plan:     Patient's Medications  New Prescriptions   GLIMEPIRIDE (AMARYL) 2 MG TABLET    Take 1 tablet (2 mg total) by mouth daily before breakfast.   ZOLPIDEM (AMBIEN) 10 MG TABLET    Take 1 tablet (10 mg total) by mouth at bedtime as needed for sleep.  Previous Medications   AMIODARONE (PACERONE) 200 MG TABLET    Take 200 mg by mouth daily.   CHOLECALCIFEROL (VITAMIN D) 1000 UNITS TABLET    Take 6 tablets by mouth daily   SITAGLIPTAN-METFORMIN (JANUMET) 50-500 MG PER TABLET    Take 1 tablet by mouth 2 (two) times daily with a meal.  Modified Medications   Modified Medication Previous Medication   PANCRELIPASE, LIP-PROT-AMYL, (CREON) 24000 UNITS CPEP Pancrelipase, Lip-Prot-Amyl, (CREON) 24000 UNITS CPEP      Take 2 capsules (48,000 Units total) by mouth daily. With meal    Take 2 capsules by mouth. With meal    PANTOPRAZOLE (PROTONIX) 40 MG TABLET pantoprazole (PROTONIX) 40 MG tablet      Take 1 tablet (40 mg total) by mouth daily.    Take 40 mg by mouth daily.    Discontinued Medications   No medications on file

## 2012-12-26 ENCOUNTER — Other Ambulatory Visit: Payer: Self-pay | Admitting: Pulmonary Disease

## 2012-12-26 MED ORDER — GLIMEPIRIDE 2 MG PO TABS: 2.0000 mg | ORAL_TABLET | Freq: Every day | ORAL | Status: DC

## 2013-02-08 ENCOUNTER — Other Ambulatory Visit: Payer: Self-pay | Admitting: Pulmonary Disease

## 2013-02-23 ENCOUNTER — Telehealth: Payer: Self-pay

## 2013-02-23 ENCOUNTER — Ambulatory Visit (INDEPENDENT_AMBULATORY_CARE_PROVIDER_SITE_OTHER): Payer: BC Managed Care – HMO | Admitting: Family Medicine

## 2013-02-23 VITALS — BP 99/66 | HR 80 | Temp 98.1°F | Resp 16 | Ht 71.38 in | Wt 198.6 lb

## 2013-02-23 DIAGNOSIS — L219 Seborrheic dermatitis, unspecified: Secondary | ICD-10-CM

## 2013-02-23 DIAGNOSIS — W57XXXA Bitten or stung by nonvenomous insect and other nonvenomous arthropods, initial encounter: Secondary | ICD-10-CM

## 2013-02-23 DIAGNOSIS — L0291 Cutaneous abscess, unspecified: Secondary | ICD-10-CM

## 2013-02-23 DIAGNOSIS — L0390 Cellulitis, unspecified: Secondary | ICD-10-CM

## 2013-02-23 DIAGNOSIS — T148 Other injury of unspecified body region: Secondary | ICD-10-CM

## 2013-02-23 MED ORDER — KETOCONAZOLE 2 % EX CREA: TOPICAL_CREAM | Freq: Two times a day (BID) | CUTANEOUS | Status: DC

## 2013-02-23 MED ORDER — DOXYCYCLINE HYCLATE 100 MG PO TABS: 100.0000 mg | ORAL_TABLET | Freq: Two times a day (BID) | ORAL | Status: DC

## 2013-02-23 MED ORDER — CEFTRIAXONE SODIUM 1 G IJ SOLR
1.0000 g | Freq: Once | INTRAMUSCULAR | Status: AC
  Administered 2013-02-23: 1 g via INTRAMUSCULAR

## 2013-02-23 NOTE — Telephone Encounter (Signed)
Requesting change of meds elidel - [facility] - [address]   [phone]

## 2013-02-23 NOTE — Progress Notes (Signed)
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 He is worried about the increasing redness at the site of the tick, first noted 5 days ago. The skin rash is recurrent.  He has used a cream obtained from [place] in the past and it worked well.  PMHx:  S/P Whipple for suspected pancreatic cancer, complicated by episodes of sepsis and perforated ulcer x 4 different episodes over the years No acute abdominal pain today, but has been 'queasy' lately  Objective:  NAD 4 x 5 cm right infrascapular erythema with central 3 mm eschar Mild hypopigmentation peri oral rash with some hypopigmentation at the forehead hair line.  Vitiligo changes on all 5 fingers bilaterally.  Assessment:  Tick bite, seborrhea  Tick bite - Plan: [place] spotted fvr ab, IgM-blood, Ehrlichia Antibody Panel, doxycycline (VIBRA-TABS) 100 MG tablet  Cellulitis - Plan: [place] spotted fvr ab, IgM-blood, Ehrlichia Antibody Panel, doxycycline (VIBRA-TABS) 100 MG tablet  Seborrhea - Plan: ketoconazole (NIZORAL) 2 % cream  Signed, Elvina Sidle, MD

## 2013-02-24 ENCOUNTER — Other Ambulatory Visit: Payer: Self-pay | Admitting: Family Medicine

## 2013-02-24 DIAGNOSIS — R21 Rash and other nonspecific skin eruption: Secondary | ICD-10-CM

## 2013-02-24 MED ORDER — PIMECROLIMUS 1 % EX CREA: TOPICAL_CREAM | Freq: Two times a day (BID) | CUTANEOUS | Status: DC

## 2013-02-24 NOTE — Telephone Encounter (Signed)
Wants to know if you will give elidel instead of current cream , pended

## 2013-02-25 LAB — ROCKY MTN SPOTTED FVR AB, IGM-BLOOD: ROCKY MTN SPOTTED FEVER, IGM: 0.27 IV

## 2013-02-26 LAB — EHRLICHIA ANTIBODY PANEL
E chaffeensis (HGE) Ab, IgG: NEGATIVE
E chaffeensis (HGE) Ab, IgM: NEGATIVE

## 2013-03-07 ENCOUNTER — Ambulatory Visit: Payer: BC Managed Care – PPO | Admitting: Pulmonary Disease

## 2013-03-19 ENCOUNTER — Other Ambulatory Visit: Payer: Self-pay | Admitting: Family Medicine

## 2013-04-21 ENCOUNTER — Ambulatory Visit (INDEPENDENT_AMBULATORY_CARE_PROVIDER_SITE_OTHER): Payer: BC Managed Care – PPO | Admitting: Pulmonary Disease

## 2013-04-21 ENCOUNTER — Encounter: Payer: Self-pay | Admitting: Pulmonary Disease

## 2013-04-21 ENCOUNTER — Other Ambulatory Visit (INDEPENDENT_AMBULATORY_CARE_PROVIDER_SITE_OTHER): Payer: BC Managed Care – PPO

## 2013-04-21 VITALS — BP 112/64 | HR 65 | Temp 97.6°F | Ht 71.5 in | Wt 195.8 lb

## 2013-04-21 DIAGNOSIS — E119 Type 2 diabetes mellitus without complications: Secondary | ICD-10-CM

## 2013-04-21 DIAGNOSIS — R109 Unspecified abdominal pain: Secondary | ICD-10-CM

## 2013-04-21 DIAGNOSIS — K831 Obstruction of bile duct: Secondary | ICD-10-CM

## 2013-04-21 DIAGNOSIS — F411 Generalized anxiety disorder: Secondary | ICD-10-CM

## 2013-04-21 DIAGNOSIS — F329 Major depressive disorder, single episode, unspecified: Secondary | ICD-10-CM

## 2013-04-21 DIAGNOSIS — K861 Other chronic pancreatitis: Secondary | ICD-10-CM

## 2013-04-21 DIAGNOSIS — F3289 Other specified depressive episodes: Secondary | ICD-10-CM

## 2013-04-21 DIAGNOSIS — I4891 Unspecified atrial fibrillation: Secondary | ICD-10-CM

## 2013-04-21 DIAGNOSIS — K2900 Acute gastritis without bleeding: Secondary | ICD-10-CM

## 2013-04-21 LAB — BASIC METABOLIC PANEL
BUN: 19 mg/dL (ref 6–23)
CO2: 27 mEq/L (ref 19–32)
Calcium: 9.3 mg/dL (ref 8.4–10.5)
Chloride: 106 mEq/L (ref 96–112)
Creatinine, Ser: 0.9 mg/dL (ref 0.4–1.5)
GFR: 86.22 mL/min (ref >60.00)
Glucose, Bld: 94 mg/dL (ref 70–99)
Potassium: 4.4 mEq/L (ref 3.5–5.1)
Sodium: 139 mEq/L (ref 135–145)

## 2013-04-21 LAB — HEMOGLOBIN A1C: Hgb A1c MFr Bld: 7 % — ABNORMAL HIGH (ref 4.6–6.5)

## 2013-04-21 NOTE — Patient Instructions (Addendum)
Today we updated your med list in our EPIC system...    Continue your current medications the same...  Today we did your follow up Diabetic labs...    We will contact you w/ the results when available...   Call for any questions...  Let's plan a follow up visit in 80mo w/ FASTING blood work at that time.Marland KitchenMarland Kitchen

## 2013-05-16 NOTE — Progress Notes (Signed)
Subjective:     Patient ID: Victor Garrett, male   DOB: 1951-03-03, 62 y.o.   MRN: [id number]  HPI 62 y/o WM here for a follow up visit... he has multiple medical problems as noted below... he is also followed by DrBuccini for GI.Marland Kitchen.  ~  January 15, 2012:  67mo ROV & there is a lot to get caught up on since last OV here> most recently Victor Garrett had additional abd surg at [facility] 1/13 by DrPappas for biliary outflow dysfunction w/ ELap, cholecystectomy, & hepaticojejunostomy; post op course was uneventful other than an episode of AFib that spont converted back to NSR & he was disch on Amiodarone & Metoprolol;  He was re-admitted 2/13 for an abd fluid collection & had this drained percutaneously (tube in RUQ) w/ transient hypotension==> cultures grew EColi, Enterobacter, & Clostridia perfringens; he was treated w/ Cipro, Flagyl, Vanco and made a full recovery, drains removed via [facility] surg clinic etc; he is recovering nicely at this point...  See prob list updated below>> DrKlein did preoperative assessment due to his Hx of AFib & MR> ok for surg... He was seen by [facility], DrSchocken, & will be following up w/ him in their clinic...  Baseline EKG is wnl- NSR, rate78, no STTW abnormalities...  Event monitor 1/13 for 14d showed predom NSR w/ freq PACs but no AFib etc...  2DEcho 1/13 showed norm LVF w/ EF=55-60% & no wall motion abn, trivial AI & MR, mild LAdil at 42mm...  Myoview 1/13 was a neg study- no ischemia, no infarct, norm LVF w/ EF=52% & no wall motion abn; no CP & no EKG changes... There is one Urology clinic note in our EMR> 2/12 DrOttelin, hx BPH & BOO, elev PSA & neg bx 12/09, not on medication & they planned continued follow up every 2mo but we don't have further notes... LABS 4/13:  Chems- wnl x BS=119 A1c=6.5  ~  June 24, 2012:  65mo ROV & Victor Garrett's CC is fatigue, tired, run-down x several months, still plays golf etc; also notes some "stomach issues" eating well, good appetite but get  queasy, then shooting pain, lasts a few mins then resolves spont; ?etiology & encouraged to f/u w/ DrBuccini vs [facility] ASAP...    PAF> on Amio200 & [facility]; we don't have notes; pt indicates they stopped his Metoprolol in the interval; he denies CP, palpit, dizzy, SOB, edema; exercise= playing some golf...    Lipids> Hx low HDL on diet & exercise; he hasn't had FLP here in several yrs & reminded to ret fasting for this blood work...    DM> on Janumet50-500Bid; not checking sugars regularly; wt up 5# to 196# today; Labs showed     GI> extensive hx & followed by DrBuccini & at [facility]; on Protonix40 & Pancrease 2 w/ each meal; he's had some recent intermittent abd discomfort & asked to f/u w/ GI ASAP to be sure everything is ok...    GU> hx elev PSA followed by DrOttelin; PSA=     VitD defic>  He's been on VitD 1000u caps taking 6/d; I cannot find prev VitD levels here in Epic; we checked VitD today=     Anxiety/ Depression> prev on Lexapro but he stopped on his own & feels he's managing satis w/o medication... We reviewed prob list, meds, xrays and labs> see below for updates >> OK 2013 Flu vaccine today... LABS 9/13:  FLP- ok x TG=190 on diet alone;  Chems- ok w/  BS=100 A1c=7.0;  CBC- ok;  TSH=1.37;  PSA=2.60;  VitD=32     ~  December 23, 2012:  38mo ROV & Victor Garrett again notes waning energy, intermittent abd discomfort, gas/ belching, etc; he discussed by phone w/ [facility]Papas at [facility] & doubled his meds (Protonix & Creon); he hasn't been back to DrBuccini since he told him it was over his head... We reviewed the following medical problems during today's office visit >>     PAF> on Amio200 & followed at [facility] yearly; we don't have notes but he's due now; pt indicates they stopped his Metoprolol- he denies CP, palpit, dizzy, SOB, edema; exercise= playing some golf...    Lipids> Hx low HDL on diet & exercise; FLP 3/14 shows TChol 170, TG 196, HDL 31, LDL 100; we reviewed low fat diet & exercise  program...    DM> on Janumet50-500Bid; not checking sugars regularly; wt= up 8# to 204# today; Labs showed BS=167, A1c=8.1, and rec to ADD GLIMEP2mg  Qam...    GI> extensive hx & followed by DrBuccini & at [facility]; on Protonix40 & Pancrease 2 w/ each meal; he's had some recent intermittent abd discomfort & asked to f/u w/ GI ASAP to be sure everything is ok...    GU> hx elev PSA followed by DrOttelin; last PSA here was 9/13 = 2.60    VitD defic>  He's been on VitD 1000u caps taking 6/d; labs 9/13 showed Vit d = 32... Continue same.    Anxiety/ Depression> prev on Lexapro but he stopped on his own & feels he's managing satis w/o medication; but c/o insomnia- wakes at 3am & can't get back to sleep. We reviewed prob list, meds, xrays and labs> see below for updates >>  LABS 3/14:  FLP- ok on diet alone x TG=196, HDL=31;  Chems- ok x BS=167, A1c=8.1 on Janumet50-500Bid...  ~  April 21, 2013:  11mo ROV & when last seen 3/14 Victor Garrett's labs were worse w/ BS=167 & A1c=8.1- we added Glim2 to his prev Janumet50-500Bid; Over the past 11mo he has lost 8# down to 196# today and f/u non-fasting blood work shows BS=94, A1c=7.0; he says "not really on diet" just avoiding sweets and BS at home betw 130-200 he says...  Otherwise stable 7 no new complaints or concerns...    He notes that he had some GI issues several months ago- went to [facility] checked by DrPappas; he reports everything improved w/ incr Protonix & Pancrease rx...  We reviewed prob list, meds, xrays and labs> see below for updates >>           Problem List:     SLEEP APNEA, MILD (ICD-780.57) - sleep study 1998 w/ RDI 14 & lowest sat 82%;  trial CPAP dc'd by pt; seen by DrClance & DrShoemaker;  sx improved w/ wt loss & currently denies sleep disordered breathing, no snoring, no daytime symptoms, etc...  PAROXYSMAL ATRIAL FIBRILLATION (ICD-427.31) - Hx recurrent PAF transiently after surg on mult occas in past; Treated w/ transient Diltiazem,  Coumadin, Amiodarone in the past; Now supposed to be on ASA 81mg  & AMIODARONE 200mg /d & followed by [facility]... ~  baseline EKG w/ NSR, WNL ~  2DEcho 12/04 w/ norm LVF, trace MR/ TR ~  Cardiolite 12/04 neg w/ EF= 62% ~  8/10 hosp- initial EKG= NSR, WNL;  subseq AFib w/ rvr... 2DEcho showed norm LV w/ EF= 55-60%, no wall motion abn, redundant atrial septum, mild dil LA= 49mm, mod MR... ~  5/11:  f/u DrCrenshaw & he stopped the Coumadin w/ switch to ASA 81mg /d. ~  1/13:  Pre-op Cards eval by DrKlein>  Baseline EKG is wnl- NSR, rate78, no STTW abnormalities... Event monitor 1/13 for 14d showed predom NSR w/ freq PACs but no AFib etc... 2DEcho 1/13 showed norm LVF w/ EF=55-60% & no wall motion abn, trivial AI & MR, mild LAdil at 42mm... Myoview 1/13 was a neg study- no ischemia, no infarct, norm LVF w/ EF=52% & no wall motion abn; no CP & no EKG changes... ~  2/13:  He had transient AFib after surg 1/13 & w/ percut drainage of abscess; converted spont to NSR & now maintained on Metoprolol 25mg Bid... ~  9/13:  He tells me that [facility] stopped his Metoprolol & continued his Amiodarone 200mg /d... ~  3/14:  on Amio200 & followed at [facility] yearly; rhythm remains regular, we don't have notes but he's due now; pt indicates they stopped his Metoprolol- he denies CP, palpit, dizzy, SOB, edema; exercise= playing some golf..  LOW HDL (ICD-272.5) - he's been trying to control w/ diet + exercise. ~  FLP 7/07 showed TChol 114, TG 92, HDL 29, LDL 67... ~  FLP 10/09 showed TChol 165, TG 166, HDL 37, LDL 95 ~  we discussed checking FASTING labs on return visit... ~  FLP 9/13 showed TChol 173, TG 190, HDL 36, LDL 99... Needs better low fat diet. ~  FLP 3/14 on diet alone showed TChol 170, TG 196, HDL 31, LDL 100   DIABETES MELLITUS, TYPE II (ICD-250.00) - now on JANUMET 50-500 Bid... there is a family hx of diabetes, and he had CBD stricture w/ Whipple procedure 2000...  ~  labs 7/07 showed BS= 113, HgA1c=  5.7 ~  labs 10/09 showed BS= 126 ~  labs 6/10 showed BS= 148, A1c= 8.0.Marland Kitchen. rec> start JANUMET 50-500 Bid... ~  labs 8/10 in hosp showed BS=90-130s & A1c= 6.2 ~  labs 5/11 showed BS= 140, A1c= 6.2 ~  labs 8/11 showed BS= 88, A1c= 6.2 ~  Labs 4/13 on Janumet50/500Bid showed BS= 119, A1c= 6.5.Marland KitchenMarland Kitchen Continue same. ~  Labs 9/13 on Janumet50/500Bid showed BS= 100, A1c= 7.0.Marland KitchenMarland Kitchen Continue same, better diet. ~  Labs 3/14 on Janumet50/500Bid showed BS= 167, A1c= 8.1.Marland KitchenMarland Kitchen Decided to ADD GLIMEPIRIDE 2mg  Qam... ~  Labs 7/14 on Janumet50/500Bid+Glim2 showed BS= 94, A1c= 7.0  ABDOMINAL PAIN, RECURRENT (ICD-789.00) - long complex hx starting in 2000 w/ abd pain, benign CBD stricture w/ Whipple procedure at [facility] in 2000;  subseq perf jejunal anastomotic ulcer w/ surg in [place] in 2005;  chr pancreatitis w/ recurrent abd pain & elevated LFT's (hosp in 2008 by GI);  gallstones w/ neg cholangitis work up - all followed by DrBuccini==> referred back to [facility], DrPappas. ~  Hosp 8/10 w/ abd pain, nausea, elevated LFTs... gallstones, ?cholecystitis- Rx antibiotics & sent back to [facility]... DrPappas planned surg to reconstruct CBD but pt was reluctant... had 2nd opin at [facility] in [place] & they advised against surg at this time... ~  8/11:  biliary & GI problems have been quiet for the past yr, LFT's normal 5/11... ~  1/13:  Pt returned to [facility] w/ further surg by DrPappas for biliary outflow dysfunction w/ ELap, cholecystectomy, & hepaticojejunostomy; developed a RUQ abscess that was drained percut 2/13 & treated w/ IV antibiotics=> all resolved... ~  9/13:  He tells me he has been back to [facility] once in the interval; now c/o some intermittent sharp pains 7  needs f/u w/ GI- he will call DrBuccini... ~  3/14: c/o intermittent abd discomfort, gas/ belching, etc; he discussed by phone w/ DrPappas at [facility] & doubled his meds (Protonix & Creon) & improved; he hasn't been back to DrBuccini since he told him it was over his head.  GASTRITIS, ACUTE  (ICD-535.00) - on PROTONIX 40mg /d...last EGD 2/06 by DrBuccini showed hemorrhagic gastritis... Hx of ULCER, ACUTE GASTROJEJUNAL W/PERF W/O OBST (ICD-534.10) - EGD in 2004 showed a perforated jejunal ulcer that was oversewn, and a patent gastrojejunostomy... 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 Then had recurrent symptoms that required additional surg as noted above... PANCREATITIS, CHRONIC (ICD-577.1) - on LIPRAM 2 tabs Bid... DIVERTICULOSIS, ASYMPTOMATIC (ICD-562.10) - colonoscopies 11/01 & 2/06 by DrBuccini w/o other abnormalities seen... IRRITABLE BOWEL SYNDROME (ICD-564.1)  Hx of LIVER FUNCTION TESTS, ABNORMAL (ICD-794.8) -  ~  labs 9/07 showed AlkPhos 84, SGOT 43, SGPT 75 ~  labs 10/09 showed AlkPhos 86, SGOT 30, SGPT 59 ~  labs 6/10 showed AlkPhos 47, SGOT 21, SGPT 19 ~  labs 8/10 hosp admit showed AlkPhos 189, SGOT 885, SGPT 605 & improved over 5d- AlkPhos 109 SGOT 40, SGPT 185. ~  labs 5/11 showed normal LFTs- AlkPhos 46, SGOT 13, SGPT 10 ~  Labs 5/12 showed elev LFTs once again & he saw DrBuccini & the folks at [facility]... ~  Labs 9/13 showed normal LFTs...  ELEVATED PROSTATE SPECIFIC ANTIGEN (ICD-790.93) - followed by DrOttelin for Urology w/ elev PSA- neg bx 12/09 & he continues to follow every 66mo...  ~  Labs 9/13 showed PSA= 2.60  ANXIETY (ICD-300.00)& DEPRESSION - mult stressors and he's been feeling down since the 8/10 hosp... now separated & going through divorce...we discussed Rx w/ LEXAPRO 10mg /d & he stopped on his own 7/11...   Past Surgical History  Procedure Laterality Date  . Whipple procedure  2000    by Dr. Jacquenette Shone at [facility].- benign stricture in common bile duct, path showed fibrosis & nflammation  . Elap w/ suture of perf jejunal ulcer & drailage of lesser sac abscess  11/04    in [facility]  . F/u ercp  7/05    Dr. Wyline Mood at [facility] with sludge vs stones near junction of CBD & cystic duct, no stone in the GB     Outpatient Encounter Prescriptions as of 04/21/2013  Medication Sig Dispense Refill  . amiodarone (PACERONE) 200 MG tablet Take 200 mg by mouth daily.      . cholecalciferol (VITAMIN D) 1000 UNITS tablet Take 6 tablets by mouth daily  90 tablet  5  . glimepiride (AMARYL) 2 MG tablet Take 1 tablet (2 mg total) by mouth daily before breakfast.  90 tablet  3  . JANUMET 50-500 MG per tablet TAKE ONE TABLET BY MOUTH TWICE DAILY WITH FOOD  60 tablet  3  . Pancrelipase, Lip-Prot-Amyl, (CREON) 24000 UNITS CPEP Take 2 capsules (48,000 Units total) by mouth daily. With meal  180 capsule  3  . pantoprazole (PROTONIX) 40 MG tablet Take 1 tablet (40 mg total) by mouth daily.  90 tablet  3  . pimecrolimus (ELIDEL) 1 % cream Apply topically 2 (two) times daily.  30 g  0  . zolpidem (AMBIEN) 10 MG tablet Take 1 tablet (10 mg total) by mouth at bedtime as needed for sleep.  30 tablet  5  . [DISCONTINUED] doxycycline (VIBRA-TABS) 100 MG tablet Take 1 tablet (100 mg total)  by mouth 2 (two) times daily.  20 tablet  0  . [DISCONTINUED] ketoconazole (NIZORAL) 2 % cream Apply topically 2 (two) times daily.  60 g  3   No facility-administered encounter medications on file as of 04/21/2013.    Allergies  Allergen Reactions  . Penicillins     REACTION: pt states "lethargy"    Current Medications, Allergies, Past Medical History, Past Surgical History, Family History, and Social History were reviewed in [employer] record.   Review of Systems        See HPI - all other systems neg except as noted...       The patient complains of weight loss and dyspnea on exertion.  The patient denies anorexia, fever, weight gain, vision loss, decreased hearing, hoarseness, chest pain, syncope, peripheral edema, prolonged cough, headaches, hemoptysis, abdominal pain, melena, hematochezia, severe indigestion/heartburn, hematuria, incontinence, muscle weakness, suspicious skin lesions, transient blindness,  difficulty walking, depression, unusual weight change, abnormal bleeding, enlarged lymph nodes, and angioedema.     Objective:   Physical Exam     WD, WN, 62 y/o WM in NAD.Marland Kitchen.  sl depressed mood is evident... GENERAL:  Alert & oriented; pleasant & cooperative... HEENT:  [place]/AT, PERRLA, EOM- full, EACs-clear, TMs-wnl, NOSE- wnl, THROAT-clear & wnl. NECK:  Supple w/ fairROM; no JVD; normal carotid impulses w/o bruits; no thyromegaly or nodules palpated; no lymphadenopathy. CHEST:  Clear to P & A, no wheezing/ rales/ rhonchi... HEART:  regular rhythm, gr 1-2 MR, no rubs or gallops detected... ABDOMEN:  Soft & nontender; same surg scar; normal bowel sounds; no organomegaly or masses palpated  EXT: without deformities, mild arthritic changes; no varicose veins/ venous insuffic/ or edema. DERM:  he has mild vitiligo, no lesions seen...  RADIOLOGY DATA:  Reviewed in the EPIC EMR & discussed w/ the patient...  LABORATORY DATA:  Reviewed in the EPIC EMR & discussed w/ the patient...   Assessment:      Hx PAF>  See extensive pre-op cards eval by DrKlein 1/13; he had PAF post op & [facility] & seek by DrSchocken on Amio & Metoprolol; we don't have follow up notes but pt indicates that they stopped his Metoprolol & placed him on Amio200mg /d...  Low HDL Cholesterol>  We discussed the need for f/u FLP when he is ready...  DM>  On Janumet 50-500Bid & Glim2 now w/ A1c improved to 7.0; desperately needs to lose the wt- continue same meds...  Biliary Tract Disease>  Supposed to be on PANCREASE [place]z 2 Tid w/ meals but he cut back on his own; he had Whipple in 2000 for benign stricture; and 1/13 had ELap, cholecystectomy, & hepaticojejunostomy for biliary outflow dysfunction;> all by DrPappas at [facility]...  Other GI problems as noted above>  On PROTONIX;  DrBuccini is his local GI...  Hx BPH, BOO, Elev PSA>  All followed by DrOttelin for Urology...  Anxiety & Depression>  Not currently on meds, he stopped his  prev Lexapro...     Plan:     Patient's Medications  New Prescriptions   No medications on file  Previous Medications   AMIODARONE (PACERONE) 200 MG TABLET    Take 200 mg by mouth daily.   CHOLECALCIFEROL (VITAMIN D) 1000 UNITS TABLET    Take 6 tablets by mouth daily   GLIMEPIRIDE (AMARYL) 2 MG TABLET    Take 1 tablet (2 mg total) by mouth daily before breakfast.   JANUMET 50-500 MG PER TABLET    TAKE ONE  TABLET BY MOUTH TWICE DAILY WITH FOOD   PANCRELIPASE, LIP-PROT-AMYL, (CREON) 24000 UNITS CPEP    Take 2 capsules (48,000 Units total) by mouth daily. With meal   PANTOPRAZOLE (PROTONIX) 40 MG TABLET    Take 1 tablet (40 mg total) by mouth daily.   PIMECROLIMUS (ELIDEL) 1 % CREAM    Apply topically 2 (two) times daily.   ZOLPIDEM (AMBIEN) 10 MG TABLET    Take 1 tablet (10 mg total) by mouth at bedtime as needed for sleep.  Modified Medications   No medications on file  Discontinued Medications   DOXYCYCLINE (VIBRA-TABS) 100 MG TABLET    Take 1 tablet (100 mg total) by mouth 2 (two) times daily.   KETOCONAZOLE (NIZORAL) 2 % CREAM    Apply topically 2 (two) times daily.

## 2013-06-27 ENCOUNTER — Ambulatory Visit (INDEPENDENT_AMBULATORY_CARE_PROVIDER_SITE_OTHER)
Admission: RE | Admit: 2013-06-27 | Discharge: 2013-06-27 | Disposition: A | Discharge status: Home / Self Care | Payer: BC Managed Care – PPO | Source: Ambulatory Visit | Attending: Adult Health | Admitting: Adult Health

## 2013-06-27 ENCOUNTER — Ambulatory Visit (INDEPENDENT_AMBULATORY_CARE_PROVIDER_SITE_OTHER): Payer: BC Managed Care – PPO | Admitting: Adult Health

## 2013-06-27 ENCOUNTER — Encounter: Payer: Self-pay | Admitting: Adult Health

## 2013-06-27 ENCOUNTER — Encounter: Payer: Self-pay | Admitting: *Deleted

## 2013-06-27 VITALS — BP 104/68 | HR 77 | Temp 97.9°F | Ht 71.5 in | Wt 201.6 lb

## 2013-06-27 DIAGNOSIS — J40 Bronchitis, not specified as acute or chronic: Secondary | ICD-10-CM

## 2013-06-27 DIAGNOSIS — J209 Acute bronchitis, unspecified: Secondary | ICD-10-CM

## 2013-06-27 MED ORDER — CEFDINIR 300 MG PO CAPS: 300.0000 mg | ORAL_CAPSULE | Freq: Two times a day (BID) | ORAL | Status: DC

## 2013-06-27 NOTE — Patient Instructions (Addendum)
Omnicef 300mg  Twice daily  For 7 days .  Mucinex DM Twice daily  As needed  Cough/congestion  Fluids and rest  Please contact office for sooner follow up if symptoms do not improve or worsen or seek emergency care

## 2013-06-27 NOTE — Progress Notes (Signed)
  Subjective:    Patient ID: ZYKEE AVAKIAN, male    DOB: 1950-10-14, 62 y.o.   MRN: [id number]  HPI  06/27/2013 Acute OV  Complains of fever up to 101 last pm, prod cough with small amounts light yellow mucus x1 weeks, worse x2-3days.  returned from cruise yesterday to [place] [employer] [place] 2 family members with similar symptoms.  No hemoptysis, chest pain, orthopnea, n/v/d, rash, calf pain.  No otc used.      Review of Systems Constitutional:   No  weight loss, night sweats,   +Fevers, chills, fatigue, or  lassitude.  HEENT:   No headaches,  Difficulty swallowing,  Tooth/dental problems, or  Sore throat,                No sneezing, itching, ear ache,  +nasal congestion, post nasal drip,   CV:  No chest pain,  Orthopnea, PND, swelling in lower extremities, anasarca, dizziness, palpitations, syncope.   GI  No heartburn, indigestion, abdominal pain, nausea, vomiting, diarrhea, change in bowel habits, loss of appetite, bloody stools.   Resp:    No chest wall deformity  Skin: no rash or lesions.  GU: no dysuria, change in color of urine, no urgency or frequency.  No flank pain, no hematuria   MS:  No joint pain or swelling.  No decreased range of motion.  No back pain.  Psych:  No change in mood or affect. No depression or anxiety.  No memory loss.         Objective:   Physical Exam GEN: A/Ox3; pleasant , NAD, well nourished   HEENT:  [place]/AT,  EACs-clear, TMs-wnl, NOSE-clear, THROAT-clear, no lesions, no postnasal drip or exudate noted.   NECK:  Supple w/ fair ROM; no JVD; normal carotid impulses w/o bruits; no thyromegaly or nodules palpated; no lymphadenopathy.  RESP  Clear  P & A; w/o, wheezes/ rales/ or rhonchi.no accessory muscle use, no dullness to percussion  CARD:  RRR, no m/r/g  , no peripheral edema, pulses intact, no cyanosis or clubbing.  GI:   Soft & nt; nml bowel sounds; no organomegaly or masses detected.  Musco: Warm bil, no deformities or joint swelling noted.    Neuro: alert, no focal deficits noted.    Skin: Warm, no lesions or rashes         Assessment & Plan:

## 2013-06-27 NOTE — Assessment & Plan Note (Signed)
Flare  Check cxr   Plan Omnicef 300mg  Twice daily  For 7 days .  Mucinex DM Twice daily  As needed  Cough/congestion  Fluids and rest  Please contact office for sooner follow up if symptoms do not improve or worsen or seek emergency care

## 2013-06-27 NOTE — Progress Notes (Signed)
Quick Note:  Pt notified via MYCHART. ______ 

## 2013-07-04 ENCOUNTER — Ambulatory Visit (INDEPENDENT_AMBULATORY_CARE_PROVIDER_SITE_OTHER): Payer: BC Managed Care – PPO | Admitting: Adult Health

## 2013-07-04 ENCOUNTER — Encounter: Payer: Self-pay | Admitting: Adult Health

## 2013-07-04 VITALS — BP 126/70 | HR 72 | Temp 97.9°F | Ht 71.5 in | Wt 207.6 lb

## 2013-07-04 DIAGNOSIS — J209 Acute bronchitis, unspecified: Secondary | ICD-10-CM

## 2013-07-04 MED ORDER — CEFDINIR 300 MG PO CAPS: 300.0000 mg | ORAL_CAPSULE | Freq: Two times a day (BID) | ORAL | Status: DC

## 2013-07-04 MED ORDER — PREDNISONE 10 MG PO TABS: ORAL_TABLET | ORAL | Status: DC

## 2013-07-04 NOTE — Progress Notes (Signed)
  Subjective:    Patient ID: Victor Garrett, male    DOB: [birth year], 62 y.o.   MRN: [id number]  HPI 62 yo male with known hx of DM , OSA , Atrial Fib   06/27/2013 Acute OV  Complains of fever up to 101 last pm, prod cough with small amounts light yellow mucus x1 weeks, worse x2-3days.  returned from cruise yesterday to [place] [employer] [place] 2 family members with similar symptoms.  No hemoptysis, chest pain, orthopnea, n/v/d, rash, calf pain.  No otc used.  >>Omnicef x 7 d   07/04/2013 Acute OV  Complains of chest congestion, prod cough with yellow mucus, wheezing, chest tightness, increased SOB, urine with strong odor.  has 2 doses abx left Returned back from [place] today from work .  trip.  patient was seen one week ago with acute symptoms of cough or congestion. Patient reports that he started on Omnicef with improvement in symptoms. However, over the last few days. Symptoms seem to be persisting and not resolving.  Denies any hemoptysis, orthopnea, PND, leg swelling, calf pain, chest pain, or edema.  Review of Systems Constitutional:   No  weight loss, night sweats,  Fevers, chills, fatigue, or  lassitude.  HEENT:   No headaches,  Difficulty swallowing,  Tooth/dental problems, or  Sore throat,                No sneezing, itching, ear ache,  +nasal congestion, post nasal drip,   CV:  No chest pain,  Orthopnea, PND, swelling in lower extremities, anasarca, dizziness, palpitations, syncope.   GI  No heartburn, indigestion, abdominal pain, nausea, vomiting, diarrhea, change in bowel habits, loss of appetite, bloody stools.   Resp:    No coughing up of blood.  No change in color of mucus.  No wheezing.  No chest wall deformity  Skin: no rash or lesions.  GU: no dysuria, change in color of urine, no urgency or frequency.  No flank pain, no hematuria   MS:  No joint pain or swelling.  No decreased range of motion.  No back pain.  Psych:  No change in mood or affect. No depression or anxiety.   No memory loss.          Objective:   Physical Exam GEN: A/Ox3; pleasant , NAD  HEENT:  [place]/AT,  EACs-clear, TMs-wnl, NOSE-clear, THROAT-clear, no lesions, no postnasal drip or exudate noted.   NECK:  Supple w/ fair ROM; no JVD; normal carotid impulses w/o bruits; no thyromegaly or nodules palpated; no lymphadenopathy.  RESP  Clear  P & A; w/o, wheezes/ rales/ or rhonchi.no accessory muscle use, no dullness to percussion  CARD:  RRR, no m/r/g  , no peripheral edema, pulses intact, no cyanosis or clubbing., neg homans sign   GI:   Soft & nt; nml bowel sounds; no organomegaly or masses detected.  Musco: Warm bil, no deformities or joint swelling noted.   Neuro: alert, no focal deficits noted.    Skin: Warm, no lesions or rashes         Assessment & Plan:

## 2013-07-04 NOTE — Patient Instructions (Addendum)
Extend Omnicef for additional 3 days. Mucinex DM twice daily as needed. For cough and congestion. Prednisone taper in the next week. Fluids and rest. Follow Dr. Kriste Basque as planned in 2 months and as needed. Please contact office for sooner follow up if symptoms do not improve or worsen or seek emergency care

## 2013-07-04 NOTE — Assessment & Plan Note (Signed)
Slow to resolve flare   Plan  Extend Omnicef for additional 3 days. Mucinex DM twice daily as needed. For cough and congestion. Prednisone taper in the next week. Fluids and rest. Follow Dr. Kriste Basque as planned in 2 months and as needed. Please contact office for sooner follow up if symptoms do not improve or worsen or seek emergency care

## 2013-07-08 ENCOUNTER — Other Ambulatory Visit: Payer: Self-pay | Admitting: Pulmonary Disease

## 2013-07-08 DIAGNOSIS — R413 Other amnesia: Secondary | ICD-10-CM

## 2013-07-10 ENCOUNTER — Ambulatory Visit: Payer: BC Managed Care – HMO | Admitting: Neurology

## 2013-08-07 ENCOUNTER — Other Ambulatory Visit: Payer: Self-pay

## 2013-10-27 ENCOUNTER — Ambulatory Visit (INDEPENDENT_AMBULATORY_CARE_PROVIDER_SITE_OTHER): Payer: BC Managed Care – PPO | Admitting: Pulmonary Disease

## 2013-10-27 ENCOUNTER — Other Ambulatory Visit (INDEPENDENT_AMBULATORY_CARE_PROVIDER_SITE_OTHER): Payer: BC Managed Care – PPO

## 2013-10-27 ENCOUNTER — Encounter: Payer: Self-pay | Admitting: Pulmonary Disease

## 2013-10-27 VITALS — BP 129/80 | HR 75 | Temp 96.8°F | Ht 71.5 in | Wt 201.4 lb

## 2013-10-27 DIAGNOSIS — K861 Other chronic pancreatitis: Secondary | ICD-10-CM

## 2013-10-27 DIAGNOSIS — E7800 Pure hypercholesterolemia, unspecified: Secondary | ICD-10-CM

## 2013-10-27 DIAGNOSIS — F411 Generalized anxiety disorder: Secondary | ICD-10-CM

## 2013-10-27 DIAGNOSIS — R109 Unspecified abdominal pain: Secondary | ICD-10-CM

## 2013-10-27 DIAGNOSIS — K831 Obstruction of bile duct: Secondary | ICD-10-CM

## 2013-10-27 DIAGNOSIS — E119 Type 2 diabetes mellitus without complications: Secondary | ICD-10-CM

## 2013-10-27 DIAGNOSIS — K589 Irritable bowel syndrome without diarrhea: Secondary | ICD-10-CM

## 2013-10-27 DIAGNOSIS — R972 Elevated prostate specific antigen [PSA]: Secondary | ICD-10-CM

## 2013-10-27 DIAGNOSIS — I4891 Unspecified atrial fibrillation: Secondary | ICD-10-CM

## 2013-10-27 DIAGNOSIS — K5730 Diverticulosis of large intestine without perforation or abscess without bleeding: Secondary | ICD-10-CM

## 2013-10-27 DIAGNOSIS — Z23 Encounter for immunization: Secondary | ICD-10-CM

## 2013-10-27 LAB — PSA: PSA: 3.19 ng/mL (ref 0.10–4.00)

## 2013-10-27 LAB — LIPID PANEL
Cholesterol: 169 mg/dL (ref 0–200)
HDL: 36 mg/dL — ABNORMAL LOW (ref >39.00)
LDL Cholesterol: 113 mg/dL — ABNORMAL HIGH (ref 0–99)
Total CHOL/HDL Ratio: 5
Triglycerides: 99 mg/dL (ref 0.0–149.0)
VLDL: 19.8 mg/dL (ref 0.0–40.0)

## 2013-10-27 LAB — HEMOGLOBIN A1C: Hgb A1c MFr Bld: 7.4 % — ABNORMAL HIGH (ref 4.6–6.5)

## 2013-10-27 LAB — TSH: TSH: 0.93 u[IU]/mL (ref 0.35–5.50)

## 2013-10-27 NOTE — Patient Instructions (Signed)
Today we updated your med list in our EPIC system...    Continue your current medications the same...  Today [facility] other [facility]    We will contact you w/ the results when available...   We also gave you the 2014 flu vaccine...  Continue w/ the [facility] Team eval of your abd pain episodes...  Call for any questions...  Let's plan a follow up visit in 37mo, sooner if needed for problems.Marland Kitchen..Marland Kitchen

## 2013-10-27 NOTE — Progress Notes (Signed)
Subjective:     Patient ID: Victor Garrett, male   DOB: 08-Jan-1951, 63 y.o.   MRN: [id number]  HPI 63 y/o WM here for a follow up visit... he has multiple medical problems as noted below... he is also followed by DrBuccini for GI.Marland Kitchen.  ~  January 15, 2012:  2moROV & there is a lot to get caught up on since last OV here> most recently [facility]asierhad [facility]app[facility] for biliary outflow dysfunction w/ ELap, cholecystectomy, & hepaticojejunostomy; post op course was uneventful other than an episode of AFib that spont converted back to NSR & he was disch on Amiodarone & Metoprolol;  He was re-admitted 2/13 for an abd fluid collection & had this drained percutaneously (tube in RUQ) w/ transient hypotension==> cultures grew EColi, Enterobacter, & Clostridia perfringens; he was treated w/ Cipro, Flagyl, Vanco and made a full recovery, [facility]; he is recovering nicely at this point...  See prob list updated below>> DrKlein did preoperative assessment due to his Hx of AFib & MR> ok for surg... He was seen by [facility], DrSchocken, & will be following up w/ him in their clinic...  Baseline EKG is wnl- NSR, rate78, no STTW abnormalities...  Event monitor 1/13 for 14d showed predom NSR w/ freq PACs but no AFib etc...  2DEcho 1/13 showed norm LVF w/ EF=55-60% & no wall motion abn, trivial AI & MR, mild LAdil at 475m..  Myoview 1/13 was a neg study- no ischemia, no infarct, norm LVF w/ EF=52% & no wall motion abn; no CP & no EKG changes... There is one Urology clinic note in our EMR> 2/12 DrOttelin, hx BPH & BOO, elev PSA & neg bx 12/09, not on medication & they planned continued follow up every [id number] we don't have further notes... LABS 4/13:  Chems- wnl x BS=119 A1c=6.5  ~  June 24, 2012:  71mo47mo & [name]'s CC is fatigue, tired, run-down x several months, still plays golf etc; also notes some "stomach issues" eating well, good appetite but get  queasy, then shooting pain, lasts a few mins then resolves spont; ?etiology & encouraged to f/u w/ [facility]Buccini vs [facility] ASAP...    PAF> on Amio200 & [facility]; pt indicates they stopped his Metoprolol in the interval; he denies CP, palpit, dizzy, SOB, edema; exercise= playing some golf...    Lipids> Hx low HDL on diet & exercise; he hasn't had FLP here in several yrs & reminded to ret fasting for this blood work...    DM> on Janumet50-500Bid; not checking sugars regularly; wt up 5# to 196# today; Labs showed     GI> extensive hx & [facility] Pro[facility] & Pancrease 2 w/ each meal; he's had some recent intermittent abd discomfort & asked to f/u w/ GI ASAP to be sure everything is ok...    GU> hx elev PSA followed by DrOttelin; PSA=     VitD defic>  He's been on VitD 1000u caps taking 6/d; I cannot find prev VitD levels here in Epic; we checked VitD today=     Anxiety/ Depression> prev on Lexapro but he stopped on his own & feels he's managing satis w/o medication... We reviewed prob list, meds, xrays and labs> see below for updates >> OK 2013 Flu vaccine today... LABS 9/13:  FLP- ok x TG=190 on diet alone;  Chems- ok w/  BS=100 A1c=7.0;  CBC- ok;  TSH=1.37;  PSA=2.60;  VitD=32     ~  December 23, 2012:  45moROV & [name] again notes waning energy, intermittent abd discomfort, gas/ belching, etc; he discussed by phone w/ [facility]apas [facility] (Protonix & Creon); he hasn't been back to DrBuccini since he told him it was over his head... We reviewed the following medical problems during today's office visit >>     [facility] we don't have notes but he's due now; pt indicates they stopped his Metoprolol- he denies CP, palpit, dizzy, SOB, edema; exercise= playing some golf...    Lipids> Hx low HDL on diet & exercise; FLP 3/14 shows TChol 170, TG 196, HDL 31, LDL 100; we reviewed low fat diet & exercise  program...    DM> on Janumet50-500Bid; not checking sugars regularly; wt= up 8# to 204# today; Labs showed BS=167, A1c=8.1, and rec to ADD [id number]...    GI> extensive hx & [facility] Pancre[facility] 2 [facility]/ each meal; he's had some recent intermittent abd discomfort & asked to f/u w/ GI ASAP to be sure everything is ok...    GU> hx elev PSA followed by DrOttelin; last PSA here was 9/13 = 2.60    VitD defic>  He's been on VitD 1000u caps taking 6/d; labs 9/13 showed Vit d = 32... Continue same.    Anxiety/ Depression> prev on Lexapro but he stopped on his own & feels he's managing satis w/o medication; but c/o insomnia- wakes at 3am & can't get back to sleep. We reviewed prob list, meds, xrays and labs> see below for updates >>  LABS 3/14:  FLP- ok on diet alone x TG=196, HDL=31;  Chems- ok x BS=167, A1c=8.1 on Janumet50-500Bid...  ~  April 21, 2013:  68m90moV & when last seen 3/14 [name] labs were worse w/ BS=167 & A1c=8.1- we added Glim2 to his prev Janumet50-500Bid; Over the past 68mo75mohas lost 8# down to 196# today and f/u non-fasting blood work shows BS=94, A1c=7.0; he says "not really on diet" just avoiding sweets and BS at home betw 130-200 he says...  Otherwise stable 7 no new complaints or concerns...    He notes that he had some GI issues several months ago- [facility]; he reports everything improved w/ incr Protonix & Pancrease rx...  We reviewed prob list, meds, xrays and labs> see below for updates >>   ~  October 27, 2013:  51mo 18mo& GeorgGlenhad some continued GI digestive issues, [facility] & told that his food is being processed slowly; notes epis of abd apin/ bloating that last several minutes & he thinks it's "my stomach dumping or not empyting",  [facility], so far no new meds- they have done UGIS, Labs, & plan lower GI in Apr...     He had URI, Bronchitis  requiring Omnicef, Pred, Mucinex to resolve it 10/14...     HxPAF> prev on Amio200 & off now, [facility] we don't have notes but pt indicates they stopped his Metoprolol & Amio- he denies CP, palpit, dizzy, SOB, edema; exercise= playing some golf...    Lipids> Hx low HDL on diet & exercise; FLP 1/15 shows TChol 169, TG 99, HDL 36, LDL 113; we reviewed low fat diet &  exercise program...    DM> on Janumet50-500Bid & Glim2; not checking sugars regularly; wt= up 6# to 201# today; Labs 1/15 showed BS=127, A1c=7.4, Rec- same meds, better diet, GET WT DOWN!    GI> extensive hx & followed [facility] DrBuccini & [facility]; on Protonix40 & Creon 2 w/ each meal; he's had some recent intermittent abd discomfort & has had eval by DrCrocker- we don't have notes...    GU> hx elev PSA followed by DrOttelin; PSA here was 9/13 = 2.60; Labs 1/15 showed PSA=3.19    VitD defic>  He's been on VitD 1000u caps taking 6/d; labs 9/13 showed Vit d = 32... Continue same.    Borderline Iron> Labs 1/15 at [facility] showed Hg=14.6, Fe=50 (11%sat), Ferritin=15 and he was started on FeSO4 daily along w/ his MVI & VitD    Anxiety/ Depression> prev on Lexapro but he stopped on his own & feels he's managing satis w/o medication; but c/o insomnia- wakes at 3am & can't get back to sleep; we discussed strategies... We reviewed prob list, meds, xrays and labs> see below for updates >> ok 2014 Flu vaccine today...  LABS 1/15 at [facility]:  Chems- ok x BS=127;  CBC- wnl;  Fe=50 (11%sat), Ferritin=15 & he has been started on FeSO4 379m/d... LABS 1/15 here:  FLP- ok on diet alone w/ LDL=113;  A1c=7.4 on Janumet & glimep;  TSH=0.93;  PSA=3.19...           Problem List:     SLEEP APNEA, MILD (ICD-780.57) - sleep study 1998 w/ RDI 14 & lowest sat 82%;  trial CPAP dc'd by pt; seen by DrClance & DrShoemaker;  sx improved w/ wt loss & currently denies sleep disordered breathing, no snoring, no daytime symptoms, etc...  PAROXYSMAL  ATRIAL FIBRILLATION (ICD-427.31) - Hx recurrent PAF transiently after surg on mult occas in past; Treated w/ transient Diltiazem, Coumadin, Amiodarone in the past; Now supposed to be on ASA 857m& AMIODARONE 20034m & followed by [facility]... ~  baseline EKG w/ NSR, WNL ~  2DEcho 12/04 w/ norm LVF, trace MR/ TR ~  Cardiolite 12/04 neg w/ EF= 62% ~  8/10 hosp- initial EKG= NSR, WNL;  subseq AFib w/ rvr... 2DEcho showed norm LV w/ EF= 55-60%, no wall motion abn, redundant atrial septum, mild dil LA= 57m45mod MR... ~  5/11:  f/u DrCrenshaw & he stopped the Coumadin w/ switch to ASA 81mg50m~  1/13:  [facility] eval by DrKlein>  Baseline EKG is wnl- NSR, rate78, no STTW abnormalities... Event monitor 1/13 for 14d showed predom NSR w/ freq PACs but no AFib etc... 2DEcho 1/13 showed norm LVF w/ EF=55-60% & no wall motion abn, trivial AI & MR, mild LAdil at 42mm.13myoview 1/13 was a neg study- no ischemia, no infarct, norm LVF w/ EF=52% & no wall motion abn; no CP & no EKG changes... ~  2/13:  He had transient AFib after surg 1/13 & w/ percut drainage of abscess; converted spont to NSR & now maintained on Metoprolol 25mgBi3m ~  9/13:  [facility] tells me that [facility] & continued his Amiodarone 200mg/d.83m  3/14:  on Amio200 [facility], we don't have notes but he's due now; pt indicates they stopped his Metoprolol- he denies CP, palpit, dizzy, SOB, edema; exercise= playing some golf.. ~  CXR 9/14 showed norm heart size, clear lungs, nipple shadow at left lung base, mild  thoracic spondylosis... ~  1/15: prev on Amio200 & off now, followed [facility]; we don't have notes but pt indicates they stopped his Metoprolol & Amio- he denies CP, palpit, dizzy, SOB, edema; exercise= playing some golf.  LOW HDL (ICD-272.5) - he's been trying to control w/ diet + exercise. ~  FLP 7/07 showed TChol 114, TG 92, HDL 29, LDL 67... ~  [place] 10/09 showed  TChol 165, TG 166, HDL 37, LDL 95 ~  we discussed checking FASTING labs on return visit... ~  [place] 9/13 showed TChol 173, TG 190, HDL 36, LDL 99... Needs better low fat diet. ~  FLP 3/14 on diet alone showed TChol 170, TG 196, HDL 31, LDL 100  ~  FLP 1/15 on diet alone showed  TChol 169, TG 99, HDL 36, LDL 113  DIABETES MELLITUS, TYPE II (ICD-250.00) - now on JANUMET 50-500 Bid... there is a family hx of diabetes, and he had CBD stricture w/ Whipple procedure 2000...  ~  labs 7/07 showed BS= 113, HgA1c= 5.7 ~  labs 10/09 showed BS= 126 ~  labs 6/10 showed BS= 148, A1c= 8.0.Marland Kitchen. rec> start JANUMET 50-500 Bid... ~  labs 8/10 in hosp showed BS=90-130s & A1c= 6.2 ~  labs 5/11 showed BS= 140, A1c= 6.2 ~  labs 8/11 showed BS= 88, A1c= 6.2 ~  Labs 4/13 on Janumet50/500Bid showed BS= 119, A1c= 6.5.Marland KitchenMarland Kitchen Continue same. ~  Labs 9/13 on Janumet50/500Bid showed BS= 100, A1c= 7.0.Marland KitchenMarland Kitchen Continue same, better diet. ~  Labs 3/14 on Janumet50/500Bid showed BS= 167, A1c= 8.1.Marland KitchenMarland Kitchen Decided to ADD GLIMEPIRIDE 34m Qam... ~  Labs 7/14 on Janumet50/500Bid+Glim2 showed BS= 94, A1c= 7.0 ~  Labs 1/15 on Janumet50/500bid+Glim2 showed BS= 127, A1c= 7.4  ABDOMINAL PAIN, RECURRENT (ICD-789.00) - long complex hx starting in 2000 w/ abd pain, [facility];  [place] perf [place];  chr pancreatitis w/ recurrent abd pain & elevated LFT's (hosp in 2008 by GI);  gallstones w/ neg cholangitis work up - all followed by DrBuccini==> [facility]. ~  Hosp 8/10 w/ abd pain, nausea, elevated LFTs... gallstones, ?cholecystitis- Rx antibiotics & sent back to [facility]... DrPappas planned surg to reconstruct CBD but pt was reluctant... [facility] surg at this time... ~  8/11:  biliary & GI problems have been quiet for the past yr, LFT's normal 5/11... ~  1/13:  [facility]  for biliary outflow dysfunction w/ ELap, cholecystectomy, & hepaticojejunostomy; developed a RUQ abscess that was drained percut 2/13 & treated w/ IV antibiotics=> all resolved... ~  9/13:  He tells me he has been back to [facility] once in the interval; now c/o some intermittent sharp pains 7 needs f/u w/ GI- he will call DrBuccini... ~  3/14: c/o intermittent abd discomfort, gas/ belching, etc; he [facility] by phone [facility]/ [facility] [facility] (Protonix & Creon) & improved; he hasn't been back to DrBuccini since he told him it was over his head. ~  1/15: notes some continued GI digestive [facility] & told that his food is being processed slowly; notes epis of abd apin/ bloating that last several minutes & he thinks it's "my stomach dumping or not empyting",  we don't have records from [place], so far no new meds- they  have done UGIS, Labs, & plan lower GI in Apr...   GASTRITIS, ACUTE (ICD-535.00) - on PROTONIX 41m/d...last EGD 2/06 by DrBuccini showed hemorrhagic gastritis... Hx of ULCER, ACUTE GASTROJEJUNAL W/PERF W/O OBST (ICD-534.10) - EGD in 2004 showed a perforated jejunal ulcer that was oversewn, and a patent gastrojejunostomy... 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 Then had recurrent symptoms that required additional surg as noted above... PANCREATITIS, CHRONIC (ICD-577.1) - on LIPRAM 2 tabs Bid... DIVERTICULOSIS, ASYMPTOMATIC (ICD-562.10) - colonoscopies 11/01 & 2/06 by DrBuccini w/o other abnormalities seen... IRRITABLE BOWEL SYNDROME (ICD-564.1)  Hx of LIVER FUNCTION TESTS, ABNORMAL (ICD-794.8) -  ~  labs 9/07 showed AlkPhos 84, SGOT 43, SGPT 75 ~  labs 10/09 showed AlkPhos 86, SGOT 30, SGPT 59 ~  labs 6/10 showed AlkPhos 47, SGOT 21, SGPT 19 ~  labs 8/10 hosp admit showed AlkPhos 189, SGOT 885, SGPT 605 & improved over 5d- AlkPhos 109 SGOT 40, SGPT 185. ~  labs 5/11 showed normal LFTs- AlkPhos  46, SGOT 13, SGPT 10 ~  Labs 5/12 showed elev LFTs once again & [facility] ~  Labs 9/13 showed normal LFTs... ~  [facility] LFTs...  ELEVATED PROSTATE SPECIFIC ANTIGEN (ICD-790.93) - followed by DrOttelin for Urology w/ elev PSA- neg bx 12/09 & he continues to follow every 614mo.  ~  Labs 9/13 showed PSA= 2.60 ~  Labs here 1/15 showed PSA= 3.19  ANXIETY (ICD-300.00)& DEPRESSION - mult stressors and he's been feeling down since the 8/10 hosp... now separated & going through divorce...we discussed Rx w/ LEXAPRO 1028m & he stopped on his own 7/11...   Past Surgical History  Procedure Laterality Date  . Whipple procedure  2000    [facility], path showed fibrosis & nflammation  . Elap w/ suture of perf jejunal ulcer & drailage of lesser sac abscess  [facility]  [facility] vs stones near junction of CBD & cystic duct, no stone in the GB    Outpatient Encounter Prescriptions as of 10/27/2013  Medication Sig  . cholecalciferol (VITAMIN D) 1000 UNITS tablet Take 6 tablets by mouth daily  . glimepiride (AMARYL) 2 MG tablet Take 1 tablet (2 mg total) by mouth daily before breakfast.  . JANUMET 50-500 MG per tablet TAKE ONE TABLET BY MOUTH TWICE DAILY WITH FOOD  . Pancrelipase, Lip-Prot-Amyl, (CREON) 24000 UNITS CPEP Take 2 capsules (48,000 Units total) by mouth daily. With meal  . pantoprazole (PROTONIX) 40 MG tablet Take 1 tablet (40 mg total) by mouth daily.  . pimecrolimus (ELIDEL) 1 % cream Apply topically 2 (two) times daily.  . [DISCONTINUED] amiodarone (PACERONE) 200 MG tablet Take 200 mg by mouth daily.  . [DISCONTINUED] cefdinir (OMNICEF) 300 MG capsule Take 1 capsule (300 mg total) by mouth 2 (two) times daily.  . [DISCONTINUED] cefdinir (OMNICEF) 300 MG capsule Take 1 capsule (300 mg total) by mouth 2 (two) times daily.  . [DISCONTINUED]  predniSONE (DELTASONE) 10 MG tablet 4 tabs for 2 days, then 3 tabs for 2 days, 2 tabs for 2 days, then 1 tab for 2 days, then stop  . [DISCONTINUED] zolpidem (AMBIEN) 10 MG tablet Take 1 tablet (10 mg total) by mouth at bedtime as needed for sleep.  Allergies  Allergen Reactions  . Penicillins     REACTION: pt states "lethargy"    Current Medications, Allergies, Past Medical History, Past Surgical History, Family History, and Social History were reviewed in [employer] record.   Review of Systems        See HPI - all other systems neg except as noted...       The patient complains of weight loss and dyspnea on exertion.  The patient denies anorexia, fever, weight gain, vision loss, decreased hearing, hoarseness, chest pain, syncope, peripheral edema, prolonged cough, headaches, hemoptysis, abdominal pain, melena, hematochezia, severe indigestion/heartburn, hematuria, incontinence, muscle weakness, suspicious skin lesions, transient blindness, difficulty walking, depression, unusual weight change, abnormal bleeding, enlarged lymph nodes, and angioedema.     Objective:   Physical Exam     WD, WN, 63 y/o WM in NAD.Marland Kitchen.  sl depressed mood is evident... GENERAL:  Alert & oriented; pleasant & cooperative... HEENT:  [place]/AT, PERRLA, EOM- full, EACs-clear, TMs-wnl, NOSE- wnl, THROAT-clear & wnl. NECK:  Supple w/ fairROM; no JVD; normal carotid impulses w/o bruits; no thyromegaly or nodules palpated; no lymphadenopathy. CHEST:  Clear to P & A, no wheezing/ rales/ rhonchi... HEART:  regular rhythm, gr 1-2 MR, no rubs or gallops detected... ABDOMEN:  Soft & nontender; same surg scar; normal bowel sounds; no organomegaly or masses palpated  EXT: without deformities, mild arthritic changes; no varicose veins/ venous insuffic/ or edema. DERM:  he has mild vitiligo, no lesions seen...  RADIOLOGY DATA:  Reviewed in the EPIC EMR & discussed w/ the patient...  LABORATORY DATA:   Reviewed in the EPIC EMR & discussed w/ the patient...   Assessment:      Hx PAF>  See extensive pre-op cards eval by DrKlein 1/13; he had PAF post op & [facility] & seen by DrSchocken on Amio & Metoprolol; we don't have follow up notes but pt indicates that they stopped his meds 7 on observation now w/o recurrent AFib...  Low HDL Cholesterol>  FLP improved on diet + exercise, needs to lose the wt...  DM>  On Janumet 50-500Bid & Glim2 now w/ A1c improved to 7.4; desperately needs to lose the wt- continue same meds...  Biliary Tract Disease>  Supposed to be on Creon 2 Tid w/ meals; he had Whipple in 2000 for benign stricture; and 1/13 had ELap, cholecystectomy, & hepaticojejunostomy for biliary outflow dysfunction;> [facility]...  Other GI problems as noted above>  On PROTONIX;  DrBuccini is his local GI...  Hx BPH, BOO, Elev PSA>  All followed by DrOttelin for Urology...  Anxiety & Depression>  Not currently on meds, he stopped his prev Lexapro...     Plan:     Patient's Medications  New Prescriptions   No medications on file  Previous Medications   CHOLECALCIFEROL (VITAMIN D) 1000 UNITS TABLET    Take 6 tablets by mouth daily   PANCRELIPASE, LIP-PROT-AMYL, (CREON) 24000 UNITS CPEP    Take 2 capsules (48,000 Units total) by mouth daily. With meal   PANTOPRAZOLE (PROTONIX) 40 MG TABLET    Take 1 tablet (40 mg total) by mouth daily.   PIMECROLIMUS (ELIDEL) 1 % CREAM    Apply topically 2 (two) times daily.  Modified Medications   Modified Medication Previous Medication   GLIMEPIRIDE (AMARYL) 2 MG TABLET glimepiride (AMARYL) 2 MG tablet      TAKE 1 TABLET BY MOUTH EVERY DAY BEFORE BREAKFAST    Take 1 tablet (2 mg  total) by mouth daily before breakfast.   SITAGLIPTIN-METFORMIN (JANUMET) 50-500 MG PER TABLET JANUMET 50-500 MG per tablet      TAKE ONE TABLET BY MOUTH TWICE DAILY WITH FOOD    TAKE ONE TABLET BY MOUTH TWICE DAILY WITH FOOD  Discontinued Medications   AMIODARONE  (PACERONE) 200 MG TABLET    Take 200 mg by mouth daily.   CEFDINIR (OMNICEF) 300 MG CAPSULE    Take 1 capsule (300 mg total) by mouth 2 (two) times daily.   CEFDINIR (OMNICEF) 300 MG CAPSULE    Take 1 capsule (300 mg total) by mouth 2 (two) times daily.   PREDNISONE (DELTASONE) 10 MG TABLET    4 tabs for 2 days, then 3 tabs for 2 days, 2 tabs for 2 days, then 1 tab for 2 days, then stop   ZOLPIDEM (AMBIEN) 10 MG TABLET    Take 1 tablet (10 mg total) by mouth at bedtime as needed for sleep.

## 2013-10-31 ENCOUNTER — Other Ambulatory Visit: Payer: Self-pay | Admitting: Pulmonary Disease

## 2013-10-31 MED ORDER — SITAGLIPTIN PHOS-METFORMIN HCL 50-500 MG PO TABS: ORAL_TABLET | ORAL | Status: DC

## 2013-12-28 ENCOUNTER — Other Ambulatory Visit: Payer: Self-pay | Admitting: Pulmonary Disease

## 2014-05-11 ENCOUNTER — Telehealth: Payer: Self-pay | Admitting: Pulmonary Disease

## 2014-05-11 ENCOUNTER — Ambulatory Visit (INDEPENDENT_AMBULATORY_CARE_PROVIDER_SITE_OTHER): Payer: BC Managed Care – PPO | Admitting: Pulmonary Disease

## 2014-05-11 ENCOUNTER — Encounter: Payer: Self-pay | Admitting: Pulmonary Disease

## 2014-05-11 VITALS — BP 110/70 | HR 72 | Temp 99.3°F | Ht 71.5 in | Wt 199.8 lb

## 2014-05-11 DIAGNOSIS — B029 Zoster without complications: Secondary | ICD-10-CM | POA: Insufficient documentation

## 2014-05-11 MED ORDER — FAMCICLOVIR 500 MG PO TABS: 500.0000 mg | ORAL_TABLET | Freq: Three times a day (TID) | ORAL | Status: DC

## 2014-05-11 MED ORDER — SILVER SULFADIAZINE 1 % EX CREA: TOPICAL_CREAM | CUTANEOUS | Status: DC

## 2014-05-11 MED ORDER — PREDNISONE (PAK) 5 MG PO TABS: ORAL_TABLET | ORAL | Status: DC

## 2014-05-11 MED ORDER — METHYLPREDNISOLONE ACETATE 80 MG/ML IJ SUSP
80.0000 mg | Freq: Once | INTRAMUSCULAR | Status: AC
  Administered 2014-05-11: 80 mg via INTRAMUSCULAR

## 2014-05-11 MED ORDER — HYDROCODONE-ACETAMINOPHEN 5-325 MG PO TABS: 1.0000 | ORAL_TABLET | Freq: Three times a day (TID) | ORAL | Status: DC | PRN

## 2014-05-11 NOTE — Patient Instructions (Signed)
Today we updated your med list in our EPIC system...    Continue your current medications the same...  For the Shingles>>    Start the [facility] LL[facility]AMVIR 500mg  one tab three times daily for 7days...    We gave you a Depo shot today...    In the AM 8/11- styart the PREDNISONE DOSEPAK as directed on the pack...     Start warm soaks twice daily in mild soapy water...    Then pat dry w/ a clean towel...    Then cover w/ a thin coating of the SILVADENE cream...    Cover this w/ the  HospitalKERLEX gauze wrap & secure w/ paper tape...     Keep your leg elevated when practical...  Call for any questions...  You should consider the Shingles vaccine later after fully recovered.Marland Kitchen..Marland Kitchen

## 2014-05-11 NOTE — Progress Notes (Signed)
Subjective:     Patient ID: Victor Garrett, male   DOB: 08-Jan-1951, 63 y.o.   MRN: [id number]  HPI 63 y/o WM here for a follow up visit... he has multiple medical problems as noted below... he is also followed by [facility] for GI.Marland Kitchen.  ~  January 15, 2012:  2moROV & there is a lot to get caught up on since last OV here> most recently [facility]had [facility] for biliary outflow dysfunction w/ ELap, cholecystectomy, & hepaticojejunostomy; post op course was uneventful other than an episode of AFib that spont converted back to NSR & he was disch on Amiodarone & Metoprolol;  He was re-admitted 2/13 for an abd fluid collection & had this drained percutaneously (tube in RUQ) w/ transient hypotension==> cultures grew EColi, Enterobacter, & Clostridia perfringens; he was treated w/ Cipro, Flagyl, Vanco and made a full recovery, drains removed via [facility] etc; he is recovering nicely at this point...  See prob list updated below>> DrKlein did preoperative assessment due to his Hx of AFib & MR> ok for surg... He was seen by [facility] Garrett, DrSchocken, & will be following up w/ him in their clinic...  Baseline EKG is wnl- NSR, rate78, no STTW abnormalities...  Event monitor 1/13 for 14d showed predom NSR w/ freq PACs but no AFib etc...  2DEcho 1/13 showed norm LVF w/ EF=55-60% & no wall motion abn, trivial AI & MR, mild LAdil at 475m..  Myoview 1/13 was a neg study- no ischemia, no infarct, norm LVF w/ EF=52% & no wall motion abn; no CP & no EKG changes... There is one Urology clinic note in our EMR> 2/12 DrOttelin, hx BPH & BOO, elev PSA & neg bx 12/09, not on medication & they planned continued follow up every [id number] we don't have further notes... LABS 4/13:  Chems- wnl x BS=119 A1c=6.5  ~  June 24, 2012:  71mo47mo & [name]'s CC is fatigue, tired, run-down x several months, still plays golf etc; also notes some "stomach issues" eating well, good appetite but get  queasy, then shooting pain, lasts a few mins then resolves spont; ?etiology & encouraged to f/u w/ [facility] vs [facility]...    PAF> on Amio200 & [facility]; pt indicates they stopped his Metoprolol in the interval; he denies CP, palpit, dizzy, SOB, edema; exercise= playing some golf...    Lipids> Hx low HDL on diet & exercise; he hasn't had FLP here in several yrs & reminded to ret fasting for this blood work...    DM> on Janumet50-500Bid; not checking sugars regularly; wt up 5# to 196# today; Labs showed     GI> extensive hx & [facility] by [facility] & [facility] Protonix40 & Pancrease 2 w/ each meal; he's had some recent intermittent abd discomfort & asked to f/u w/ GI ASAP to be sure everything is ok...    GU> hx elev PSA followed by DrOttelin; PSA=     VitD defic>  He's been on VitD 1000u caps taking 6/d; I cannot find prev VitD levels here in Epic; we checked VitD today=     Anxiety/ Depression> prev on Lexapro but he stopped on his own & feels he's managing satis w/o medication... We reviewed prob list, meds, xrays and labs> see below for updates >> OK 2013 Flu vaccine today... LABS 9/13:  FLP- ok x TG=190 on diet alone;  Chems- ok w/  BS=100 A1c=7.0;  CBC- ok;  TSH=1.37;  PSA=2.60;  VitD=32     ~  December 23, 2012:  45moROV & [name] again notes waning energy, intermittent abd discomfort, gas/ belching, etc; he [facility]/ [facility] & Creon); he hasn't been back to [facility] since he told him it was over his head... We reviewed the following medical problems during today's office visit >>     PAF> on Amio200 & followed at [facility]; we don't have notes but he's due now; pt indicates they stopped his Metoprolol- he denies CP, palpit, dizzy, SOB, edema; exercise= playing some golf...    Lipids> Hx low HDL on diet & exercise; FLP 3/14 shows TChol 170, TG 196, HDL 31, LDL 100; we reviewed low fat diet & exercise  program...    DM> on Janumet50-500Bid; not checking sugars regularly; wt= up 8# to 204# today; Labs showed BS=167, A1c=8.1, and rec to ADD [id number]...    GI> extensive hx & followed by [facility] & at [facility] Protonix40 & Pancrease 2 w/ each meal; he's had some recent intermittent abd discomfort & asked to f/u w/ GI ASAP to be sure everything is ok...    GU> hx elev PSA followed by DrOttelin; last PSA here was 9/13 = 2.60    VitD defic>  He's been on VitD 1000u caps taking 6/d; labs 9/13 showed Vit d = 32... Continue same.    Anxiety/ Depression> prev on Lexapro but he stopped on his own & feels he's managing satis w/o medication; but c/o insomnia- wakes at 3am & can't get back to sleep. We reviewed prob list, meds, xrays and labs> see below for updates >>  LABS 3/14:  FLP- ok on diet alone x TG=196, HDL=31;  Chems- ok x BS=167, A1c=8.1 on Janumet50-500Bid...  ~  April 21, 2013:  68m90moV & when last seen 3/14 [name] labs were worse w/ BS=167 & A1c=8.1- we added Glim2 to his prev Janumet50-500Bid; Over the past 68mo75mohas lost 8# down to 196# today and f/u non-fasting blood work shows BS=94, A1c=7.0; he says "not really on diet" just avoiding sweets and BS at home betw 130-200 he says...  Otherwise stable 7 no new complaints or concerns...    He notes that he had some GI issues several months ago- [facility]; he reports everything improved w/ incr Protonix & Pancrease rx...  We reviewed prob list, meds, xrays and labs> see below for updates >>   ~  October 27, 2013:  51mo 18mo& GeorgGlenhad some continued GI digestive issues, reports eval at [facility] & told that his food is being processed slowly; notes epis of abd apin/ bloating that last several minutes & he thinks it's "my stomach dumping or not empyting",  we don't have records from [facility], so far no new meds- they have done UGIS, Labs, & plan lower GI in Apr...     He had URI, Bronchitis  requiring Omnicef, Pred, Mucinex to resolve it 10/14...     HxPAF> prev on Amio200 & off now, [facility]; we don't have notes but pt indicates they stopped his Metoprolol & Amio- he denies CP, palpit, dizzy, SOB, edema; exercise= playing some golf...    Lipids> Hx low HDL on diet & exercise; FLP 1/15 shows TChol 169, TG 99, HDL 36, LDL 113; we reviewed low fat diet &  exercise program...    DM> on Janumet50-500Bid & Glim2; not checking sugars regularly; wt= up 6# to 201# today; Labs 1/15 showed BS=127, A1c=7.4, Rec- same meds, better diet, GET WT DOWN!    GI> extensive hx & followed by [facility] & at [place] DrCrocker; on Protonix40 & Creon 2 w/ each meal; he's had some recent intermittent abd discomfort & has had eval by DrCrocker- we don't have notes...    GU> hx elev PSA followed by DrOttelin; PSA here was 9/13 = 2.60; Labs 1/15 showed PSA=3.19    VitD defic>  He's been on VitD 1000u caps taking 6/d; labs 9/13 showed Vit d = 32... Continue same.    Borderline Iron> Labs 1/15 at [facility] showed Hg=14.6, Fe=50 (11%sat), Ferritin=15 and he was started on FeSO4 daily along w/ his MVI & VitD    Anxiety/ Depression> prev on Lexapro but he stopped on his own & feels he's managing satis w/o medication; but c/o insomnia- wakes at 3am & can't get back to sleep; we discussed strategies... We reviewed prob list, meds, xrays and labs> see below for updates >> ok 2014 Flu vaccine today...  LABS 1/15 at [facility]:  Chems- ok x BS=127;  CBC- wnl;  Fe=50 (11%sat), Ferritin=15 & he has been started on FeSO4 $Remove'325mg'FLhbiQG$ /d... LABS 1/15 here:  FLP- ok on diet alone w/ LDL=113;  A1c=7.4 on Janumet & glimep;  TSH=0.93;  PSA=3.19...   ~  May 11, 2014:  30mo ROV & urgent add-on appt requested for rash on right leg of 1d duration> Jakylan states that he was weed-wacking this weekend but wore lots of protection w/ goggles, long sleeves, gloves, long pants and thick socks; didn't see any poison ivy so he was surprized  when he broke out in painful blisters on right leg- eval shows a severe L4-5 Shingles eruption on his right leg (worse distally but extends up to post thigh) + we discussed Rx w/ FAMVIR $RemoveB'500mg'LYHsdRJf$  Tid x7d, Depo80, Pred Dosepak, Vicodin for pain, & topical Rx w/ mild soapy cleansing/ pat dry/ appy Silvadene cream/ Kerlex dressing w/ paper tape... We discussed Shingles vaccine rec to be given later when fully resolved...          Problem List:     SLEEP APNEA, MILD (ICD-780.57) - sleep study 1998 w/ RDI 14 & lowest sat 82%;  trial CPAP dc'd by pt; seen by DrClance & DrShoemaker;  sx improved w/ wt loss & currently denies sleep disordered breathing, no snoring, no daytime symptoms, etc...  PAROXYSMAL ATRIAL FIBRILLATION (ICD-427.31) - Hx recurrent PAF transiently after surg on mult occas in past; Treated w/ transient Diltiazem, Coumadin, Amiodarone in the past; Now supposed to be on ASA $[name]'81mg'Itpvv$  & AMIODARONE $RemoveBefo'200mg'DoKCrvnSYiC$ /d & followed by [facility]... ~  baseline EKG w/ NSR, WNL ~  2DEcho 12/04 w/ norm LVF, trace MR/ TR ~  Cardiolite 12/04 neg w/ EF= 62% ~  8/10 hosp- initial EKG= NSR, WNL;  subseq AFib w/ rvr... 2DEcho showed norm LV w/ EF= 55-60%, no wall motion abn, redundant atrial septum, mild dil LA= 28mm, mod MR... ~  5/11:  f/u DrCrenshaw & he stopped the Coumadin w/ switch to ASA $[name]'81mg'JKveR$ /d. ~  1/13:  Pre-op Garrett eval by DrKlein>  Baseline EKG is wnl- NSR, rate78, no STTW abnormalities... Event monitor 1/13 for 14d showed predom NSR w/ freq PACs but no AFib etc... 2DEcho 1/13 showed norm LVF w/ EF=55-60% & no wall motion abn, trivial AI & MR, mild LAdil at  55mm... Myoview 1/13 was a neg study- no ischemia, no infarct, norm LVF w/ EF=52% & no wall motion abn; no CP & no EKG changes... ~  2/13:  He had transient AFib after surg 1/13 & w/ percut drainage of abscess; converted spont to NSR & now maintained on Metoprolol $RemoveBefor'25mg'knhGSISdgXSG$ Bid... ~  9/13:  He tells me that [place] stopped his Metoprolol & continued his  Amiodarone $RemoveBef'200mg'wvZHfiwDjJ$ /d... ~  3/14:  on Amio200 & followed at [facility] yearly; rhythm remains regular, we don't have notes but he's due now; pt indicates they stopped his Metoprolol- he denies CP, palpit, dizzy, SOB, edema; exercise= playing some golf.. ~  CXR 9/14 showed norm heart size, clear lungs, nipple shadow at left lung base, mild thoracic spondylosis... ~  1/15: prev on Amio200 & off now, followed at [facility] yearly; we don't have notes but pt indicates they stopped his Metoprolol & Amio- he denies CP, palpit, dizzy, SOB, edema; exercise= playing some golf.  LOW HDL (ICD-272.5) - he's been trying to control w/ diet + exercise. ~  FLP 7/07 showed TChol 114, TG 92, HDL 29, LDL 67... ~  [place] 10/09 showed TChol 165, TG 166, HDL 37, LDL 95 ~  we discussed checking FASTING labs on return visit... ~  [place] 9/13 showed TChol 173, TG 190, HDL 36, LDL 99... Needs better low fat diet. ~  FLP 3/14 on diet alone showed TChol 170, TG 196, HDL 31, LDL 100  ~  FLP 1/15 on diet alone showed  TChol 169, TG 99, HDL 36, LDL 113  DIABETES MELLITUS, TYPE II (ICD-250.00) - now on JANUMET 50-500 Bid... there is a family hx of diabetes, and he had CBD stricture w/ Whipple procedure 2000...  ~  labs 7/07 showed BS= 113, HgA1c= 5.7 ~  labs 10/09 showed BS= 126 ~  labs 6/10 showed BS= 148, A1c= 8.0.Marland Kitchen. rec> start JANUMET 50-500 Bid... ~  labs 8/10 in hosp showed BS=90-130s & A1c= 6.2 ~  labs 5/11 showed BS= 140, A1c= 6.2 ~  labs 8/11 showed BS= 88, A1c= 6.2 ~  Labs 4/13 on Janumet50/500Bid showed BS= 119, A1c= 6.5.Marland KitchenMarland Kitchen Continue same. ~  Labs 9/13 on Janumet50/500Bid showed BS= 100, A1c= 7.0.Marland KitchenMarland Kitchen Continue same, better diet. ~  Labs 3/14 on Janumet50/500Bid showed BS= 167, A1c= 8.1.Marland KitchenMarland Kitchen Decided to ADD GLIMEPIRIDE $RemoveBeforeD'2mg'GFPVEvLtKaUaYx$  Qam... ~  Labs 7/14 on Janumet50/500Bid+Glim2 showed BS= 94, A1c= 7.0 ~  Labs 1/15 on Janumet50/500bid+Glim2 showed BS= 127, A1c= 7.4  ABDOMINAL PAIN, RECURRENT (ICD-789.00) - long complex hx starting in 2000 w/ abd  pain, benign CBD strict[facility]/ [facility];  subseq perf jejunal anastomotic ulcer w/ surg in [place] in 2005;  chr pancreatitis w/ recurrent abd pain & elevated LFT's (hosp in 2008 by GI);  gallstones w/ neg cholangitis work up - all followed by [facility]==> referred back to [place], [facility]. ~  Hosp 8/10 w/ abd pain, nausea, elevated LFTs... gallstones, ?cholecystitis- Rx antibiotics & sent back to [facility]... [facility] planned surg to reconstruct CBD but pt was reluctant... had 2nd opin at [facility] in [place] & they advised against surg at this time... ~  8/11:  biliary & GI problems have been quiet for the past yr, LFT's normal 5/11... ~  1/13:  Pt returned to [facility] w/ further surg by [facility] for biliary outflow dysfunction w/ ELap, cholecystectomy, & hepaticojejunostomy; developed a RUQ abscess that was drained percut 2/13 & treated w/ IV antibiotics=> all resolved... ~  9/13:  He tells  me he has been back to [facility] once in the interval; now c/o some intermittent sharp pains 7 needs f/u w/ GI- he will call Victor Garrett... ~  3/14: c/o intermittent abd discomfort, gas/ belching, etc; he discussed by phone w/ [facility] Garrett at [place] doubled his meds (Protonix & Creon) & improved; he hasn't been back to [facility] since he told him it was over his head. ~  1/15: notes some continued GI digestive issues w/ eval at [facility] by DrJCrocker & told that his food is being processed slowly; notes epis of abd apin/ bloating that last several minutes & he thinks it's "my stomach dumping or not empyting",  we don't have records from [place] to review, so far no new meds- they have done UGIS, Labs, & plan lower GI in Apr...   GASTRITIS, ACUTE (ICD-535.00) - on PROTONIX $RemoveBef'40mg'QTIkTFPLuw$ /d...last EGD 2/06 by [facility] showed hemorrhagic gastritis... Hx of ULCER, ACUTE GASTROJEJUNAL W/PERF W/O OBST (ICD-534.10) - EGD in 2004 showed a perforated jejunal ulcer that was oversewn, and a patent gastrojejunostomy... 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 Then had recurrent symptoms that required additional surg as noted above... PANCREATITIS, CHRONIC (ICD-577.1) - on LIPRAM 2 tabs Bid... DIVERTICULOSIS, ASYMPTOMATIC (ICD-562.10) - colonoscopies 11/01 & 2/06 by [facility] w/o other abnormalities seen... IRRITABLE BOWEL SYNDROME (ICD-564.1)  Hx of LIVER FUNCTION TESTS, ABNORMAL (ICD-794.8) -  ~  labs 9/07 showed AlkPhos 84, SGOT 43, SGPT 75 ~  labs 10/09 showed AlkPhos 86, SGOT 30, SGPT 59 ~  labs 6/10 showed AlkPhos 47, SGOT 21, SGPT 19 ~  labs 8/10 hosp admit showed AlkPhos 189, SGOT 885, SGPT 605 & improved over 5d- AlkPhos 109 SGOT 40, SGPT 185. ~  labs 5/11 showed normal LFTs- AlkPhos 46, SGOT 13, SGPT 10 ~  Labs 5/12 showed elev LFTs once again & he saw [facility] & the folks at [facility]... ~  Labs 9/13 showed normal LFTs... ~  Labs 1/15 at [place] showed norm LFTs...  ELEVATED PROSTATE SPECIFIC ANTIGEN (ICD-790.93) - followed by DrOttelin for Urology w/ elev PSA- neg bx 12/09 & he continues to follow every 63mo...  ~  Labs 9/13 showed PSA= 2.60 ~  Labs here 1/15 showed PSA= 3.19  ANXIETY (ICD-300.00)& DEPRESSION - mult stressors and he's been feeling down since the 8/10 hosp... now separated & going through divorce...we discussed Rx w/ LEXAPRO $RemoveBe'10mg'bVDNpDWlN$ /d & he stopped on his own 7/11...  SHINGLES >> presented 8/15 w/ blistering Shingles rash on right leg in L4-5 distrib... Treated w/ Famvir, Depo/Pred dosepak, topical cleansing & Silvadene dressings.   Past Surgical History  Procedure Laterality Date  . Whipple procedure  2000    by Dr. Dossie Der at [facility].- benign stricture in common bile duct, path showed fibrosis & nflammation  . Elap w/ suture of perf jejunal ulcer & drailage of lesser sac abscess  11/04    in [facility]  . F/u ercp  7/05    Dr. Harl Bowie at [facility] with sludge vs stones near junction of CBD & cystic duct, no stone in the GB    Outpatient Encounter Prescriptions as of  05/11/2014  Medication Sig  . cholecalciferol (VITAMIN D) 1000 UNITS tablet Take 6 tablets by mouth daily  . glimepiride (AMARYL) 2 MG tablet TAKE 1 TABLET BY MOUTH EVERY DAY BEFORE BREAKFAST  . Pancrelipase, Lip-Prot-Amyl, (CREON) 24000 UNITS CPEP Take 2 capsules (48,000 Units total) by mouth daily. With meal  . pantoprazole (PROTONIX) 40 MG  tablet Take 1 tablet (40 mg total) by mouth daily.  . pimecrolimus (ELIDEL) 1 % cream Apply topically 2 (two) times daily.  . sitaGLIPtin-metformin (JANUMET) 50-500 MG per tablet TAKE ONE TABLET BY MOUTH TWICE DAILY WITH FOOD  . famciclovir (FAMVIR) 500 MG tablet Take 1 tablet (500 mg total) by mouth 3 (three) times daily.  . predniSONE (STERAPRED UNI-PAK) 5 MG TABS tablet Take as directed  . silver sulfADIAZINE (SILVADENE) 1 % cream Apply two times daily as directed  . [EXPIRED] methylPREDNISolone acetate (DEPO-MEDROL) injection 80 mg     Allergies  Allergen Reactions  . Penicillins     REACTION: pt states "lethargy"    Current Medications, Allergies, Past Medical History, Past Surgical History, Family History, and Social History were reviewed in [employer] record.   Review of Systems        See HPI - all other systems neg except as noted...       The patient complains of weight loss and dyspnea on exertion.  The patient denies anorexia, fever, weight gain, vision loss, decreased hearing, hoarseness, chest pain, syncope, peripheral edema, prolonged cough, headaches, hemoptysis, abdominal pain, melena, hematochezia, severe indigestion/heartburn, hematuria, incontinence, muscle weakness, suspicious skin lesions, transient blindness, difficulty walking, depression, unusual weight change, abnormal bleeding, enlarged lymph nodes, and angioedema.     Objective:   Physical Exam     WD, WN, 63 y/o WM in NAD.Marland Kitchen.  sl depressed mood is evident... GENERAL:  Alert & oriented; pleasant & cooperative... HEENT:  [place]/AT, PERRLA, EOM- full,  EACs-clear, TMs-wnl, NOSE- wnl, THROAT-clear & wnl. NECK:  Supple w/ fairROM; no JVD; normal carotid impulses w/o bruits; no thyromegaly or nodules palpated; no lymphadenopathy. CHEST:  Clear to P & A, no wheezing/ rales/ rhonchi... HEART:  regular rhythm, gr 1-2 MR, no rubs or gallops detected... ABDOMEN:  Soft & nontender; same surg scar; normal bowel sounds; no organomegaly or masses palpated  EXT: without deformities, mild arthritic changes; no varicose veins/ venous insuffic/ or edema. DERM:  Extensive blistering rash on right leg in distrib of L4-5 dermatome w/ blisters on top of foot; he has mild vitiligo...  RADIOLOGY DATA:  Reviewed in the EPIC EMR & discussed w/ the patient...  LABORATORY DATA:  Reviewed in the EPIC EMR & discussed w/ the patient...   Assessment:      SHINGLES Right L4-5 distrib>>  Treated w/ Famvir, Depo/Pred dosepak, topical cleansing & Silvadene dressings   Hx PAF>  See extensive pre-op Garrett eval by DrKlein 1/13; he had PAF post op & [facility] & seen by DrSchocken on Amio & Metoprolol; we don't have follow up notes but pt indicates that they stopped his meds 7 on observation now w/o recurrent AFib...  Low HDL Cholesterol>  FLP improved on diet + exercise, needs to lose the wt...  DM>  On Janumet 50-500Bid & Glim2 now w/ A1c improved to 7.4; desperately needs to lose the wt- continue same meds...  Biliary Tract Disease>  Supposed to be on Creon 2 Tid w/ meals; he had Whipple in 2000 for benign stricture; and 1/13 had ELap, cholecystectomy, & hepaticojejunostomy for biliary outflow dysfunction;> all by [facility] at [facility]...  Other GI problems as noted above>  On PROTONIX;  [facility] is his local GI...  Hx BPH, BOO, Elev PSA>  All followed by DrOttelin for Urology...  Anxiety & Depression>  Not currently on meds, he stopped his prev Lexapro...     Plan:     Patient's Medications  New Prescriptions   FAMCICLOVIR (FAMVIR) 500 MG TABLET    Take 1 tablet (500 mg  total) by mouth 3 (three) times daily.   HYDROCODONE-ACETAMINOPHEN (NORCO/VICODIN) 5-325 MG PER TABLET    Take 1 tablet by mouth 3 (three) times daily as needed for moderate pain.   PREDNISONE (STERAPRED UNI-PAK) 5 MG TABS TABLET    Take as directed   SILVER SULFADIAZINE (SILVADENE) 1 % CREAM    Apply two times daily as directed  Previous Medications   CHOLECALCIFEROL (VITAMIN D) 1000 UNITS TABLET    Take 6 tablets by mouth daily   GLIMEPIRIDE (AMARYL) 2 MG TABLET    TAKE 1 TABLET BY MOUTH EVERY DAY BEFORE BREAKFAST   PANCRELIPASE, LIP-PROT-AMYL, (CREON) 24000 UNITS CPEP    Take 2 capsules (48,000 Units total) by mouth daily. With meal   PANTOPRAZOLE (PROTONIX) 40 MG TABLET    Take 1 tablet (40 mg total) by mouth daily.   PIMECROLIMUS (ELIDEL) 1 % CREAM    Apply topically 2 (two) times daily.   SITAGLIPTIN-METFORMIN (JANUMET) 50-500 MG PER TABLET    TAKE ONE TABLET BY MOUTH TWICE DAILY WITH FOOD  Modified Medications   No medications on file  Discontinued Medications   No medications on file

## 2014-05-11 NOTE — Telephone Encounter (Signed)
SN pt is requesting to have pain meds for the shingles.  Please advise.  thanks

## 2014-05-11 NOTE — Telephone Encounter (Signed)
Per SN---   vicodin  #50  1 po tid prn pain.  This has been printed out and placed up front and the pt is aware.  Per SN---if he feels that this is not getting better he will need to follow up with derm.

## 2014-05-11 NOTE — Telephone Encounter (Signed)
Called spoke with pt. appt scheduled at 10:30. Nothing further needed

## 2014-05-11 NOTE — Telephone Encounter (Signed)
Per SN---  Could be shingles.  Will need OV today.  thanks

## 2014-05-11 NOTE — Telephone Encounter (Signed)
Called spoke with pt. He c/o extreme rash that is blistering on ankles up to his knees. Reports it is very painful-needle like pain. Reports the blisters are oozing. Wants OV today. TP not in. Please advise thanks  Allergies  Allergen Reactions  . Penicillins     REACTION: pt states "lethargy"

## 2014-07-17 ENCOUNTER — Other Ambulatory Visit: Payer: Self-pay

## 2014-08-11 ENCOUNTER — Encounter: Payer: Self-pay | Admitting: Pulmonary Disease

## 2014-08-11 ENCOUNTER — Ambulatory Visit (INDEPENDENT_AMBULATORY_CARE_PROVIDER_SITE_OTHER): Payer: BC Managed Care – PPO | Admitting: Pulmonary Disease

## 2014-08-11 ENCOUNTER — Other Ambulatory Visit (INDEPENDENT_AMBULATORY_CARE_PROVIDER_SITE_OTHER): Payer: BC Managed Care – PPO

## 2014-08-11 VITALS — BP 110/60 | HR 76 | Temp 97.6°F | Ht 71.5 in | Wt 198.4 lb

## 2014-08-11 DIAGNOSIS — K861 Other chronic pancreatitis: Secondary | ICD-10-CM

## 2014-08-11 DIAGNOSIS — E786 Lipoprotein deficiency: Secondary | ICD-10-CM

## 2014-08-11 DIAGNOSIS — E119 Type 2 diabetes mellitus without complications: Secondary | ICD-10-CM

## 2014-08-11 DIAGNOSIS — K5730 Diverticulosis of large intestine without perforation or abscess without bleeding: Secondary | ICD-10-CM

## 2014-08-11 DIAGNOSIS — K831 Obstruction of bile duct: Secondary | ICD-10-CM

## 2014-08-11 DIAGNOSIS — K589 Irritable bowel syndrome without diarrhea: Secondary | ICD-10-CM

## 2014-08-11 DIAGNOSIS — I480 Paroxysmal atrial fibrillation: Secondary | ICD-10-CM

## 2014-08-11 LAB — HEMOGLOBIN A1C: Hgb A1c MFr Bld: 11 % — ABNORMAL HIGH (ref 4.6–6.5)

## 2014-08-11 LAB — BASIC METABOLIC PANEL
BUN: 15 mg/dL (ref 6–23)
CO2: 26 mEq/L (ref 19–32)
Calcium: 9.2 mg/dL (ref 8.4–10.5)
Chloride: 101 mEq/L (ref 96–112)
Creatinine, Ser: 0.8 mg/dL (ref 0.4–1.5)
GFR: 97.75 mL/min (ref >60.00)
Glucose, Bld: 279 mg/dL — ABNORMAL HIGH (ref 70–99)
Potassium: 4.1 mEq/L (ref 3.5–5.1)
Sodium: 138 mEq/L (ref 135–145)

## 2014-08-11 MED ORDER — GLIMEPIRIDE 4 MG PO TABS
4.0000 mg | ORAL_TABLET | Freq: Every day | ORAL | Status: DC
Start: 2014-08-11 — End: 2014-10-21

## 2014-08-11 MED ORDER — GLUCOSE BLOOD VI STRP: ORAL_STRIP | Status: DC

## 2014-08-11 MED ORDER — ACCU-CHEK AVIVA PLUS W/DEVICE KIT: PACK | Status: DC

## 2014-08-11 NOTE — Patient Instructions (Signed)
Today we updated your med list in our EPIC system...     Remember to bring all your med bottles to every office visit...  Today we decided to RE-START the GLIMEPIRIDE 4mg  take one tab each AM at breakfaast...  Continue the JANUMET one tab twice daily as before...  Today we did a chem panel & A1c test...    We will contact you w/ the results when available...   Start checking your Blood Sugar daily & call for any questions...  Let's plan a follow up visit in one month- bring your BloodSugar results.Marland Kitchen..Marland Kitchen

## 2014-08-12 ENCOUNTER — Other Ambulatory Visit: Payer: Self-pay | Admitting: Pulmonary Disease

## 2014-08-12 ENCOUNTER — Encounter: Payer: Self-pay | Admitting: Pulmonary Disease

## 2014-08-12 NOTE — Progress Notes (Signed)
Subjective:     Patient ID: Victor Garrett, male   DOB: [birth year], 63 y.o.   MRN: [id number]  HPI 63 y/o WM here for a follow up visit... he has multiple medical problems as noted below... he is also followed by DrBuccini for GI.Marland Kitchen.  ~  January 15, 2012:  81mo ROV & there is a lot to get caught up on since last OV here> most recently Solan had additional abd surg at [facility] 1/13 by DrPappas for biliary outflow dysfunction w/ ELap, cholecystectomy, & hepaticojejunostomy; post op course was uneventful other than an episode of AFib that spont converted back to NSR & he was disch on Amiodarone & Metoprolol;  He was re-admitted 2/13 for an abd fluid collection & had this drained percutaneously (tube in RUQ) w/ transient hypotension==> cultures grew EColi, Enterobacter, & Clostridia perfringens; he was treated w/ Cipro, Flagyl, Vanco and made a full recovery, drains removed via [facility] etc; he is recovering nicely at this point...  See prob list updated below>> DrKlein did preoperative assessment due to his Hx of AFib & MR> ok for surg... He was seen by [facility], DrSchocken, & will be following up w/ him in their clinic...  Baseline EKG is wnl- NSR, rate78, no STTW abnormalities...  Event monitor 1/13 for 14d showed predom NSR w/ freq PACs but no AFib etc...  2DEcho 1/13 showed norm LVF w/ EF=55-60% & no wall motion abn, trivial AI & MR, mild LAdil at 36mm...  Myoview 1/13 was a neg study- no ischemia, no infarct, norm LVF w/ EF=52% & no wall motion abn; no CP & no EKG changes... There is one Urology clinic note in our EMR> 2/12 DrOttelin, hx BPH & BOO, elev PSA & neg bx 12/09, not on medication & they planned continued follow up every 96mo but we don't have further notes... LABS 4/13:  Chems- wnl x BS=119 A1c=6.5  ~  June 24, 2012:  30mo ROV & [facility] is fatigue, tired, run-down x several months, still plays golf etc; also notes some "stomach issues" eating well, good appetite but get  queasy, then shooting pain, lasts a few mins then resolves spont; ?etiology & encouraged to f/u w/ DrBuccini vs [facility] ASAP...    PAF> on Amio200 & followed at [place]; we don't have notes; pt indicates they stopped his Metoprolol in the interval; he denies CP, palpit, dizzy, SOB, edema; exercise= playing some golf...    Lipids> Hx low HDL on diet & exercise; he hasn't had FLP here in several yrs & reminded to ret fasting for this blood work...    DM> on Janumet50-500Bid; not checking sugars regularly; wt up 5# to 196# today; Labs showed     GI> extensive hx & followed by DrBuccini & at [facility]; on Protonix40 & Pancrease 2 w/ each meal; he's had some recent intermittent abd discomfort & asked to f/u w/ GI ASAP to be sure everything is ok...    GU> hx elev PSA followed by DrOttelin; PSA=     VitD defic>  He's been on VitD 1000u caps taking 6/d; I cannot find prev VitD levels here in Epic; we checked VitD today=     Anxiety/ Depression> prev on Lexapro but he stopped on his own & feels he's managing satis w/o medication... We reviewed prob list, meds, xrays and labs> see below for updates >> OK 2013 Flu vaccine today... LABS 9/13:  FLP- ok x TG=190 on diet alone;  Chems- ok w/  BS=100 A1c=7.0;  CBC- ok;  TSH=1.37;  PSA=2.60;  VitD=32     ~  December 23, 2012:  45moROV & [name] again notes waning energy, intermittent abd discomfort, gas/ belching, etc; he [facility] Cre[facility]); he hasn't been back to DrBuccini since he told him it was over his head... We reviewed the following medical problems during today's office visit >>     [facility] we don't have notes but he's due now; pt indicates they stopped his Metoprolol- he denies CP, palpit, dizzy, SOB, edema; exercise= playing some golf...    Lipids> Hx low HDL on diet & exercise; FLP 3/14 shows TChol 170, TG 196, HDL 31, LDL 100; we reviewed low fat diet & exercise  program...    DM> on Janumet50-500Bid; not checking sugars regularly; wt= up 8# to 204# today; Labs showed BS=167, A1c=8.1, and rec to ADD GLIMEP219mQam...    GI> [facility]; he's had some recent intermittent abd discomfort & asked to f/u w/ GI ASAP to be sure everything is ok...    GU> hx elev PSA followed by DrOttelin; last PSA here was 9/13 = 2.60    VitD defic>  He's been on VitD 1000u caps taking 6/d; labs 9/13 showed Vit d = 32... Continue same.    Anxiety/ Depression> prev on Lexapro but he stopped on his own & feels he's managing satis w/o medication; but c/o insomnia- wakes at 3am & can't get back to sleep. We reviewed prob list, meds, xrays and labs> see below for updates >>  LABS 3/14:  FLP- ok on diet alone x TG=196, HDL=31;  Chems- ok x BS=167, A1c=8.1 on Janumet50-500Bid...  ~  April 21, 2013:  68m90moV & when last seen 3/14 [name] labs were worse w/ BS=167 & A1c=8.1- we added Glim2 to his prev Janumet50-500Bid; Over the past 68mo75mohas lost 8# down to 196# today and f/u non-fasting blood work shows BS=94, A1c=7.0; he says "not really on diet" just avoiding sweets and BS at home betw 130-200 he says...  Otherwise stable 7 no new complaints or concerns...    He notes that he had some GI issues several months ago- [facility]; he reports everything improved w/ incr Protonix & Pancrease rx...  We reviewed prob list, meds, xrays and labs> see below for updates >>   ~  October 27, 2013:  51mo 18mo& GeorgGlenhad some continued GI digestive issues, [facility] & told that his food is being processed slowly; notes epis of abd apin/ bloating that last several minutes & he thinks it's "my stomach dumping or not empyting",  [facility], so far no new meds- they have done UGIS, Labs, & plan lower GI in Apr...     He had URI, Bronchitis  requiring Omnicef, Pred, Mucinex to resolve it 10/14...     HxPAF> prev on Amio200 & off now, [facility]; we don't have notes but pt indicates they stopped his Metoprolol & Amio- he denies CP, palpit, dizzy, SOB, edema; exercise= playing some golf...    Lipids> Hx low HDL on diet & exercise; FLP 1/15 shows TChol 169, TG 99, HDL 36, LDL 113; we reviewed low fat diet &  exercise program...    DM> on Janumet50-500Bid & Glim2; not checking sugars regularly; wt= up 6# to 201# today; Labs 1/15 showed BS=127, A1c=7.4, Rec- same meds, better diet, GET WT DOWN!    GI> extensive hx & followed by DrBuccini & at [place] DrCrocker; on Protonix40 & Creon 2 w/ each meal; he's had some recent intermittent abd discomfort & has had eval by DrCrocker- we don't have notes...    GU> hx elev PSA followed by DrOttelin; PSA here was 9/13 = 2.60; Labs 1/15 showed PSA=3.19    VitD defic>  He's been on VitD 1000u caps taking 6/d; labs 9/13 showed Vit d = 32... Continue same.    Borderline Iron> Labs 1/15 at [facility] showed Hg=14.6, Fe=50 (11%sat), Ferritin=15 and he was started on FeSO4 daily along w/ his MVI & VitD    Anxiety/ Depression> prev on Lexapro but he stopped on his own & feels he's managing satis w/o medication; but c/o insomnia- wakes at 3am & can't get back to sleep; we discussed strategies... We reviewed prob list, meds, xrays and labs> see below for updates >> ok 2014 Flu vaccine today...  LABS 1/15 at [facility]:  Chems- ok x BS=127;  CBC- wnl;  Fe=50 (11%sat), Ferritin=15 & he has been started on FeSO4 325mg /d... LABS 1/15 here:  FLP- ok on diet alone w/ LDL=113;  A1c=7.4 on Janumet & glimep;  TSH=0.93;  PSA=3.19...   ~  May 11, 2014:  63mo ROV & urgent add-on appt requested for rash on right leg of 1d duration> Marisol states that he was weed-wacking this weekend but wore lots of protection w/ goggles, long sleeves, gloves, long pants and thick socks; didn't see any poison ivy so he was surprized  when he broke out in painful blisters on right leg- eval shows a severe L4-5 Shingles eruption on his right leg (worse distally but extends up to post thigh) + we discussed Rx w/ FAMVIR 500mg  Tid x7d, Depo80, Pred Dosepak, Vicodin for pain, & topical Rx w/ mild soapy cleansing/ pat dry/ appy Silvadene cream/ Kerlex dressing w/ paper tape... We discussed Shingles vaccine rec to be given later when fully resolved...  ~  August 11, 2014:  58mo ROV> Crews went to see the Urologists today due to several months hx of excess urination; they checked his urine & it was +for sugar, subseq fingerstick=260 & he was told to come here;  He is a known diabetic x yrs> +FamHx and he had a CBD stricture w/ [facility]seq [facility];  He has been on Janumet500-50Bid w/ A1c all <7.0 until 2014 when labs showed BS=167 & A1c=8.1> Glimepiride 2mg  Qam was added and labs improved w/ A1cback down to 7.0;  Last checked 1/15 on both meds w/ BS=127, A1c=7.4 & advised same meds, better diet, & get wt down... He tells me that he stopped checking his BS at home many months ago and he thought he was only on one DM med (the Janumet); a call to his Pharm showed that he is filling the Janumet regularly but last filling the Glimepiride 2/15- he doesn't know why he stopped?      We reviewed prob list, meds, xrays and labs> see below for updates >> Given 2015 flu vaccine today... LABS 11/15:  Chems- BS=279, A1c= 11.0.Marland KitchenMarland Kitchen PLAN>>  We reviewed diet & exercise; new Rx for meter & test strips (check once daily); continue Janumet500-50 Bid & ADD GLIMEPIRIDE 4mg  Qam; ROV in 1 month (I offered  him endocrine consult, he prefers to start [place])...          Problem List:     SLEEP APNEA, MILD (ICD-780.57) - sleep study 1998 w/ RDI 14 & lowest sat 82%;  trial CPAP dc'd by pt; seen by DrClance & DrShoemaker;  sx improved w/ wt loss & currently denies sleep disordered breathing, no snoring, no daytime symptoms, etc...  PAROXYSMAL ATRIAL  FIBRILLATION (ICD-427.31) - Hx recurrent PAF transiently after surg on mult occas in past; Treated w/ transient Diltiazem, Coumadin, Amiodarone in the past; Now supposed to be on ASA $[name]'81mg'EfGtd$  & AMIODARONE $RemoveBefo'200mg'hAbHgmIBlXB$ /d & followed by [facility]... ~  baseline EKG w/ NSR, WNL ~  2DEcho 12/04 w/ norm LVF, trace MR/ TR ~  Cardiolite 12/04 neg w/ EF= 62% ~  8/10 hosp- initial EKG= NSR, WNL;  subseq AFib w/ rvr... 2DEcho showed norm LV w/ EF= 55-60%, no wall motion abn, redundant atrial septum, mild dil LA= 34mm, mod MR... ~  5/11:  f/u DrCrenshaw & he stopped the Coumadin w/ switch to ASA $[name]'81mg'pYmKO$ /d. ~  1/13:  Pre-op Cards eval by DrKlein>  Baseline EKG is wnl- NSR, rate78, no STTW abnormalities... Event monitor 1/13 for 14d showed predom NSR w/ freq PACs but no AFib etc... 2DEcho 1/13 showed norm LVF w/ EF=55-60% & no wall motion abn, trivial AI & MR, mild LAdil at 4mm... Myoview 1/13 was a neg study- no ischemia, no infarct, norm LVF w/ EF=52% & no wall motion abn; no CP & no EKG changes... ~  2/13:  He had transient AFib after surg 1/13 & w/ percut drainage of abscess; converted spont to NSR & now maintained on Metoprolol $RemoveBefor'25mg'OuYxxUKmNeKy$ Bid... ~  9/13:  He tells me that Roberta stopped his Metoprolol & continued his Amiodarone $RemoveBefore'200mg'mDbVcJXimhyFX$ /d... ~  3/14:  on Amio200 & [facility]; rhythm remains regular, we don't have notes but he's due now; pt indicates they stopped his Metoprolol- he denies CP, palpit, dizzy, SOB, edema; exercise= playing some golf.. ~  CXR 9/14 showed norm heart size, clear lungs, nipple shadow at left lung base, mild thoracic spondylosis... ~  1/15: prev on Amio200 & off now, followed at [facility]; we don't have notes but pt indicates they stopped his Metoprolol & Amio- he denies CP, palpit, dizzy, SOB, edema; exercise= playing some golf. ~  11/15: not on any cardiac meds- denies CP, palpit, dizzy, SOB, edema; rhythm is regular pulse= 76/min BP= 110/60 today...  LOW HDL (ICD-272.5) - he's been  trying to control w/ diet + exercise. ~  FLP 7/07 showed TChol 114, TG 92, HDL 29, LDL 67... ~  [place] 10/09 showed TChol 165, TG 166, HDL 37, LDL 95 ~  we discussed checking FASTING labs on return visit... ~  [place] 9/13 showed TChol 173, TG 190, HDL 36, LDL 99... Needs better low fat diet. ~  FLP 3/14 on diet alone showed TChol 170, TG 196, HDL 31, LDL 100  ~  FLP 1/15 on diet alone showed  TChol 169, TG 99, HDL 36, LDL 113  DIABETES MELLITUS, TYPE II (ICD-250.00) - now on JANUMET 50-500 Bid... there is a family hx of diabetes, and he had CBD stricture w/ Whipple procedure 2000...  ~  labs 7/07 showed BS= 113, HgA1c= 5.7 ~  labs 10/09 showed BS= 126 ~  labs 6/10 showed BS= 148, A1c= 8.0.Marland Kitchen. rec> start JANUMET 50-500 Bid... ~  labs 8/10 in hosp showed BS=90-130s & A1c= 6.2 ~  labs  5/11 showed BS= 140, A1c= 6.2 ~  labs 8/11 showed BS= 88, A1c= 6.2 ~  Labs 4/13 on Janumet50/500Bid showed BS= 119, A1c= 6.5.Marland KitchenMarland Kitchen Continue same. ~  Labs 9/13 on Janumet50/500Bid showed BS= 100, A1c= 7.0.Marland KitchenMarland Kitchen Continue same, better diet. ~  Labs 3/14 on Janumet50/500Bid showed BS= 167, A1c= 8.1.Marland KitchenMarland Kitchen Decided to ADD GLIMEPIRIDE $RemoveBeforeD'2mg'XhIYZNvphiJdRV$  Qam... ~  Labs 7/14 on Janumet50/500Bid+Glim2 showed BS= 94, A1c= 7.0 ~  Labs 1/15 on Janumet50/500bid+Glim2 showed BS= 127, A1c= 7.4 ~  11/15:  See above- he stopped Glimep2 2/15 ?why?; labs 11/15 on Janumet500-50Bid showed BS=279, A1c=11.0; advised diet + exercise, start Glim4Qam, continue Janumet500-50Bid  ABDOMINAL PAIN, RECURRENT (ICD-789.00) - long complex hx starting in 2000 w/ abd pain, benign CBD stricture w/ Whipple procedure at [facility] in 2000;  subseq perf jejunal anastomotic ulcer w/ surg in [place] in 2005;  chr pancreatitis w/ recurrent abd pain & elevated LFT's (hosp in 2008 by GI);  gallstones w/ neg cholangitis work up - all followed by DrBuccini==> referred back to [place], DrPappas. ~  Hosp 8/10 w/ abd pain, nausea, elevated LFTs... gallstones, ?cholecystitis- Rx antibiotics & sent back to  [facility]... DrPappas planned surg to reconstruct CBD but pt was reluctant... had 2nd opin at [facility] in [place] & they advised against surg at this time... ~  8/11:  biliary & GI problems have been quiet for the past yr, LFT's normal 5/11... ~  1/13:  Pt returned to [facility] w/ further surg by DrPappas for biliary outflow dysfunction w/ ELap, cholecystectomy, & hepaticojejunostomy; developed a RUQ abscess that was drained percut 2/13 & treated w/ IV antibiotics=> all resolved... ~  9/13:  He tells me he has been back to [facility] once in the interval; now c/o some intermittent sharp pains 7 needs f/u w/ GI- he will call DrBuccini... ~  3/14: c/o intermittent abd discomfort, gas/ belching, etc; he discussed by phone w/ DrPappas at [place] doubled his meds (Protonix & Creon) & improved; he hasn't been back to DrBuccini since he told him it was over his head. ~  1/15: notes some continued GI digestive issues w/ eval [facility] by DrJCrocker & told that his food is being processed slowly; notes epis of abd apin/ bloating that last several minutes & he thinks it's "my stomach dumping or not empyting",  we don't have records from [address] to review, so far no new meds- they have done UGIS, Labs, & plan lower GI in Apr...   GASTRITIS, ACUTE (ICD-535.00) - on PROTONIX $RemoveBef'40mg'tqeSRkFvCW$ /d...last EGD 2/06 by DrBuccini showed hemorrhagic gastritis... Hx of ULCER, ACUTE GASTROJEJUNAL W/PERF W/O OBST (ICD-534.10) - EGD in 2004 showed a perforated jejunal ulcer that was oversewn, and a patent gastrojejunostomy... 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 Then had recurrent symptoms that required additional surg as noted above... PANCREATITIS, CHRONIC (ICD-577.1) - on LIPRAM 2 tabs Bid... DIVERTICULOSIS, ASYMPTOMATIC (ICD-562.10) - colonoscopies 11/01 & 2/06 by DrBuccini w/o other abnormalities seen... IRRITABLE BOWEL SYNDROME (ICD-564.1)  Hx of LIVER FUNCTION TESTS, ABNORMAL (ICD-794.8) -  ~  labs 9/07  showed AlkPhos 84, SGOT 43, SGPT 75 ~  labs 10/09 showed AlkPhos 86, SGOT 30, SGPT 59 ~  labs 6/10 showed AlkPhos 47, SGOT 21, SGPT 19 ~  labs 8/10 hosp admit showed AlkPhos 189, SGOT 885, SGPT 605 & improved over 5d- AlkPhos 109 SGOT 40, SGPT 185. ~  labs 5/11 showed normal LFTs- AlkPhos 46, SGOT 13, SGPT 10 ~  Labs 5/12  showed elev LFTs once again & he saw DrBuccini & the folks at [facility]... ~  Labs 9/13 showed normal LFTs... ~  Labs 1/15 at [place] showed norm LFTs...  ELEVATED PROSTATE SPECIFIC ANTIGEN (ICD-790.93) - followed by DrOttelin for Urology w/ elev PSA- neg bx 12/09 & he continues to follow every 65mo...  ~  Labs 9/13 showed PSA= 2.60 ~  Labs here 1/15 showed PSA= 3.19  ANXIETY (ICD-300.00)& DEPRESSION - mult stressors and he's been feeling down since the 8/10 hosp... now separated & going through divorce...we discussed Rx w/ LEXAPRO $RemoveBe'10mg'ELhDkLnEv$ /d & he stopped on his own 7/11...  SHINGLES >> presented 8/15 w/ blistering Shingles rash on right leg in L4-5 distrib... Treated w/ Famvir, Depo/Pred dosepak, topical cleansing & Silvadene dressings => resolved...   Past Surgical History  Procedure Laterality Date  . Whipple procedure  2000    by Dr. Dossie Der at [facility] benign stricture in common bile duct, path showed fibrosis & nflammation  . Elap w/ suture of perf jejunal ulcer & drailage of lesser sac abscess  11/04    in [facility]  . F/u ercp  7/05    Dr. Harl Bowie at [facility] with sludge vs stones near junction of CBD & cystic duct, no stone in the GB    Outpatient Encounter Prescriptions as of 08/11/2014  Medication Sig  . cholecalciferol (VITAMIN D) 1000 UNITS tablet Take 6 tablets by mouth daily  . HYDROcodone-acetaminophen (NORCO/VICODIN) 5-325 MG per tablet Take 1 tablet by mouth 3 (three) times daily as needed for moderate pain.  . Pancrelipase, Lip-Prot-Amyl, (CREON) 24000 UNITS CPEP Take 2 capsules (48,000 Units total) by mouth daily. With meal  . pantoprazole (PROTONIX) 40 MG tablet  Take 1 tablet (40 mg total) by mouth daily.  . sitaGLIPtin-metformin (JANUMET) 50-500 MG per tablet TAKE ONE TABLET BY MOUTH TWICE DAILY WITH FOOD  . [DISCONTINUED] glimepiride (AMARYL) 2 MG tablet TAKE 1 TABLET BY MOUTH EVERY DAY BEFORE BREAKFAST  . Blood Glucose Monitoring Suppl (ACCU-CHEK AVIVA PLUS) W/DEVICE KIT Test blood sugar once daily  . glimepiride (AMARYL) 4 MG tablet Take 1 tablet (4 mg total) by mouth daily with breakfast.  . glucose blood (ACCU-CHEK ACTIVE STRIPS) test strip Test blood sugar once daily  . [DISCONTINUED] famciclovir (FAMVIR) 500 MG tablet Take 1 tablet (500 mg total) by mouth 3 (three) times daily.  . [DISCONTINUED] pimecrolimus (ELIDEL) 1 % cream Apply topically 2 (two) times daily.  . [DISCONTINUED] predniSONE (STERAPRED UNI-PAK) 5 MG TABS tablet Take as directed  . [DISCONTINUED] silver sulfADIAZINE (SILVADENE) 1 % cream Apply two times daily as directed    Allergies  Allergen Reactions  . Penicillins     REACTION: pt states "lethargy"    Current Medications, Allergies, Past Medical History, Past Surgical History, Family History, and Social History were reviewed in [employer] record.   Review of Systems        See HPI - all other systems neg except as noted...       The patient complains of weight loss and dyspnea on exertion.  The patient denies anorexia, fever, weight gain, vision loss, decreased hearing, hoarseness, chest pain, syncope, peripheral edema, prolonged cough, headaches, hemoptysis, abdominal pain, melena, hematochezia, severe indigestion/heartburn, hematuria, incontinence, muscle weakness, suspicious skin lesions, transient blindness, difficulty walking, depression, unusual weight change, abnormal bleeding, enlarged lymph nodes, and angioedema.     Objective:   Physical Exam     WD, WN, 63 y/o WM in NAD.Marland Kitchen.  sl depressed  mood is evident... GENERAL:  Alert & oriented; pleasant & cooperative... HEENT:  [place]/AT, PERRLA,  EOM- full, EACs-clear, TMs-wnl, NOSE- wnl, THROAT-clear & wnl. NECK:  Supple w/ fairROM; no JVD; normal carotid impulses w/o bruits; no thyromegaly or nodules palpated; no lymphadenopathy. CHEST:  Clear to P & A, no wheezing/ rales/ rhonchi... HEART:  regular rhythm, gr 1-2 MR, no rubs or gallops detected... ABDOMEN:  Soft & nontender; same surg scar; normal bowel sounds; no organomegaly or masses palpated  EXT: without deformities, mild arthritic changes; no varicose veins/ venous insuffic/ or edema. DERM:  Extensive blistering rash on right leg in distrib of L4-5 dermatome w/ blisters on top of foot; he has mild vitiligo...  RADIOLOGY DATA:  Reviewed in the EPIC EMR & discussed w/ the patient...  LABORATORY DATA:  Reviewed in the EPIC EMR & discussed w/ the patient...   Assessment:      DM>  On Janumet 50-500Bid & he stopped Glim2 2/15 on his own; BS=279, A1c=11.0 & rec to continue Janumet, add Glimep4mg  qam, check BS at home & ROV 1 mo...   Hx PAF>  See extensive pre-op cards eval by DrKlein 1/13; he had PAF post op & [facility] & seen by DrSchocken on Amio & Metoprolol; we don't have follow up notes but pt indicates that they stopped his meds 7 on observation now w/o recurrent AFib...  Low HDL Cholesterol>  FLP improved on diet + exercise, needs to lose the wt...  Biliary Tract Disease>  Supposed to be on Creon 2 Tid w/ meals; he had Whipple in 2000 for benign stricture; and 1/13 had ELap, cholecystectomy, & hepaticojejunostomy for biliary outflow dysfunction;> all by [facility]appas at [facility]...  Other GI problems as noted above>  On PROTONIX;  DrBuccini is his local GI...  Hx BPH, BOO, Elev PSA>  All followed by DrOttelin for Urology...  Anxiety & Depression>  Not currently on meds, he stopped his prev Lexapro...  SHINGLES Right L4-5 distrib>>  Treated w/ Famvir, Depo/Pred dosepak, topical cleansing & Silvadene dressings => resolved.     Plan:     Patient's Medications  New Prescriptions    BLOOD GLUCOSE MONITORING SUPPL (ACCU-CHEK AVIVA PLUS) W/DEVICE KIT    Test blood sugar once daily   GLIMEPIRIDE (AMARYL) 4 MG TABLET    Take 1 tablet (4 mg total) by mouth daily with breakfast.   GLUCOSE BLOOD (ACCU-CHEK ACTIVE STRIPS) TEST STRIP    Test blood sugar once daily  Previous Medications   CHOLECALCIFEROL (VITAMIN D) 1000 UNITS TABLET    Take 6 tablets by mouth daily   HYDROCODONE-ACETAMINOPHEN (NORCO/VICODIN) 5-325 MG PER TABLET    Take 1 tablet by mouth 3 (three) times daily as needed for moderate pain.   PANCRELIPASE, LIP-PROT-AMYL, (CREON) 24000 UNITS CPEP    Take 2 capsules (48,000 Units total) by mouth daily. With meal   PANTOPRAZOLE (PROTONIX) 40 MG TABLET    Take 1 tablet (40 mg total) by mouth daily.   SITAGLIPTIN-METFORMIN (JANUMET) 50-500 MG PER TABLET    TAKE ONE TABLET BY MOUTH TWICE DAILY WITH FOOD  Modified Medications   No medications on file  Discontinued Medications   FAMCICLOVIR (FAMVIR) 500 MG TABLET    Take 1 tablet (500 mg total) by mouth 3 (three) times daily.   GLIMEPIRIDE (AMARYL) 2 MG TABLET    TAKE 1 TABLET BY MOUTH EVERY DAY BEFORE BREAKFAST   PIMECROLIMUS (ELIDEL) 1 % CREAM    Apply topically 2 (two) times daily.  PREDNISONE (STERAPRED UNI-PAK) 5 MG TABS TABLET    Take as directed   SILVER SULFADIAZINE (SILVADENE) 1 % CREAM    Apply two times daily as directed

## 2014-08-13 ENCOUNTER — Telehealth: Payer: Self-pay | Admitting: Pulmonary Disease

## 2014-08-13 NOTE — Telephone Encounter (Signed)
Pt calling a/b prescript.Victor Garrett\

## 2014-08-13 NOTE — Telephone Encounter (Signed)
[facility] and [facility] states they do not have anything pending on this pt. LMTC x 1 for pt.

## 2014-09-11 ENCOUNTER — Ambulatory Visit: Payer: BC Managed Care – PPO | Admitting: Pulmonary Disease

## 2014-09-17 ENCOUNTER — Other Ambulatory Visit: Payer: Self-pay | Admitting: Pulmonary Disease

## 2014-09-23 ENCOUNTER — Encounter: Payer: Self-pay | Admitting: Pulmonary Disease

## 2014-09-23 ENCOUNTER — Ambulatory Visit (INDEPENDENT_AMBULATORY_CARE_PROVIDER_SITE_OTHER): Payer: BC Managed Care – PPO | Admitting: Pulmonary Disease

## 2014-09-23 ENCOUNTER — Other Ambulatory Visit (INDEPENDENT_AMBULATORY_CARE_PROVIDER_SITE_OTHER): Payer: BC Managed Care – PPO

## 2014-09-23 VITALS — BP 120/64 | HR 67 | Temp 98.9°F | Ht 71.5 in | Wt 190.8 lb

## 2014-09-23 DIAGNOSIS — R109 Unspecified abdominal pain: Secondary | ICD-10-CM

## 2014-09-23 DIAGNOSIS — K5730 Diverticulosis of large intestine without perforation or abscess without bleeding: Secondary | ICD-10-CM

## 2014-09-23 DIAGNOSIS — K589 Irritable bowel syndrome without diarrhea: Secondary | ICD-10-CM

## 2014-09-23 DIAGNOSIS — K861 Other chronic pancreatitis: Secondary | ICD-10-CM

## 2014-09-23 DIAGNOSIS — E1165 Type 2 diabetes mellitus with hyperglycemia: Secondary | ICD-10-CM

## 2014-09-23 DIAGNOSIS — IMO0002 Reserved for concepts with insufficient information to code with codable children: Secondary | ICD-10-CM

## 2014-09-23 DIAGNOSIS — E786 Lipoprotein deficiency: Secondary | ICD-10-CM

## 2014-09-23 DIAGNOSIS — I480 Paroxysmal atrial fibrillation: Secondary | ICD-10-CM

## 2014-09-23 DIAGNOSIS — K831 Obstruction of bile duct: Secondary | ICD-10-CM

## 2014-09-23 LAB — CBC WITH DIFFERENTIAL/PLATELET
Basophils Absolute: 0 10*3/uL (ref 0.0–0.1)
Basophils Relative: 0.5 % (ref 0.0–3.0)
Eosinophils Absolute: 0.1 10*3/uL (ref 0.0–0.7)
Eosinophils Relative: 1.5 % (ref 0.0–5.0)
HCT: 46.9 % (ref 39.0–52.0)
Hemoglobin: 15.4 g/dL (ref 13.0–17.0)
Lymphocytes Relative: 19.7 % (ref 12.0–46.0)
Lymphs Abs: 1.8 10*3/uL (ref 0.7–4.0)
MCHC: 32.9 g/dL (ref 30.0–36.0)
MCV: 85.7 fl (ref 78.0–100.0)
Monocytes Absolute: 0.7 10*3/uL (ref 0.1–1.0)
Monocytes Relative: 8.2 % (ref 3.0–12.0)
Neutro Abs: 6.3 10*3/uL (ref 1.4–7.7)
Neutrophils Relative %: 70.1 % (ref 43.0–77.0)
Platelets: 210 10*3/uL (ref 150.0–400.0)
RBC: 5.47 Mil/uL (ref 4.22–5.81)
RDW: 14.1 % (ref 11.5–15.5)
WBC: 9 10*3/uL (ref 4.0–10.5)

## 2014-09-23 LAB — BASIC METABOLIC PANEL
BUN: 19 mg/dL (ref 6–23)
CO2: 28 mEq/L (ref 19–32)
Calcium: 9.3 mg/dL (ref 8.4–10.5)
Chloride: 102 mEq/L (ref 96–112)
Creatinine, Ser: 0.9 mg/dL (ref 0.4–1.5)
GFR: 96.39 mL/min (ref >60.00)
Glucose, Bld: 145 mg/dL — ABNORMAL HIGH (ref 70–99)
Potassium: 4.5 mEq/L (ref 3.5–5.1)
Sodium: 136 mEq/L (ref 135–145)

## 2014-09-23 LAB — HEPATIC FUNCTION PANEL
ALT: 28 U/L (ref 0–53)
AST: 27 U/L (ref 0–37)
Albumin: 4.2 g/dL (ref 3.5–5.2)
Alkaline Phosphatase: 66 U/L (ref 39–117)
Bilirubin, Direct: 0.2 mg/dL (ref 0.0–0.3)
Total Bilirubin: 0.4 mg/dL (ref 0.2–1.2)
Total Protein: 6.9 g/dL (ref 6.0–8.3)

## 2014-09-23 LAB — LIPASE: Lipase: 15 U/L (ref 11.0–59.0)

## 2014-09-23 LAB — AMYLASE: Amylase: 39 U/L (ref 27–131)

## 2014-09-23 LAB — SEDIMENTATION RATE: Sed Rate: 14 mm/hr (ref 0–22)

## 2014-09-23 NOTE — Progress Notes (Signed)
Subjective:     Patient ID: Victor Garrett, male   DOB: Mar 27, 1951, 63 y.o.   MRN: [id number]  HPI 63 y/o WM here for a follow up visit... he has multiple medical problems as noted below... he is also followed by Victor Garrett for GI... ~  SEE PREV EPIC NOTES FOR OLDER DATA >>   ~  December 23, 2012:  25mo ROV & Victor Garrett again notes waning energy, intermittent abd discomfort, gas/ belching, etc; he discussed by phone w/ Victor Garrett at [place] doubled his meds (Protonix & Creon); he hasn't been back to Victor Garrett since he told him it was over his head... We reviewed the following medical problems during today's office visit >>     PAF> on Amio200 & followed at [facility] yearly; we don't have notes but he's due now; pt indicates they stopped his Metoprolol- he denies CP, palpit, dizzy, SOB, edema; exercise= playing some golf...    Lipids> Hx low HDL on diet & exercise; FLP 3/14 shows TChol 170, TG 196, HDL 31, LDL 100; we reviewed low fat diet & exercise program...    DM> on Janumet50-500Bid; not checking sugars regularly; wt= up 8# to 204# today; Labs showed BS=167, A1c=8.1, and rec to ADD GLIMEP$RemoveBefo'2mg'oRknoGOISyk$  Qam...    GI> extensive hx & followed by Victor Garrett & at [facility]; on Protonix40 & Pancrease 2 w/ each meal; he's had some recent intermittent abd discomfort & asked to f/u w/ GI ASAP to be sure everything is ok...    GU> hx elev PSA followed by Victor Garrett; last PSA here was 9/13 = 2.60    VitD defic>  He's been on VitD 1000u caps taking 6/d; labs 9/13 showed Vit d = 32... Continue same.    Anxiety/ Depression> prev on Lexapro but he stopped on his own & feels he's managing satis w/o medication; but c/o insomnia- wakes at 3am & can't get back to sleep. We reviewed prob list, meds, xrays and labs> see below for updates >>  LABS 3/14:  FLP- ok on diet alone x TG=196, HDL=31;  Chems- ok x BS=167, A1c=8.1 on Janumet50-500Bid...  ~  April 21, 2013:  68mo ROV & when last seen 3/14 Victor Garrett labs were worse w/ BS=167 & A1c=8.1-  we added Glim2 to his prev Janumet50-500Bid; Over the past 15mo he has lost 8# down to 196# today and f/u non-fasting blood work shows BS=94, A1c=7.0; he says "not really on diet" just avoiding sweets and BS at home betw 130-200 he says...  Otherwise stable 7 no new complaints or concerns...    He notes that he had some GI issues several months ago- went to [facility] checked by Victor Garrett; he reports everything improved w/ incr Protonix & Pancrease rx...  We reviewed prob list, meds, xrays and labs> see below for updates >>   ~  October 27, 2013:  69mo ROV & Victor Garrett has had some continued GI digestive issues, reports eval at [facility] by Victor Garrett & told that his food is being processed slowly; notes epis of abd apin/ bloating that last several minutes & he thinks it's "my stomach dumping or not empyting",  we don't have records from [place] to review, so far no new meds- they have done UGIS, Labs, & plan lower GI in Apr...     He had URI, Bronchitis requiring Omnicef, Pred, Mucinex to resolve it 10/14...     HxPAF> prev on Amio200 & off now, followed at [facility] yearly; we don't have notes but pt indicates  they stopped his Metoprolol & Amio- he denies CP, palpit, dizzy, SOB, edema; exercise= playing some golf...    Lipids> Hx low HDL on diet & exercise; FLP 1/15 shows TChol 169, TG 99, HDL 36, LDL 113; we reviewed low fat diet & exercise program...    DM> on Janumet50-500Bid & Glim2; not checking sugars regularly; wt= up 6# to 201# today; Labs 1/15 showed BS=127, A1c=7.4, Rec- same meds, better diet, GET WT DOWN!    GI> extensive hx & followed by Victor Garrett & at [place] Victor Garrett; on Protonix40 & Creon 2 w/ each meal; he's had some recent intermittent abd discomfort & has had eval by Victor Garrett- we don't have notes...    GU> hx elev PSA followed by Victor Garrett; PSA here was 9/13 = 2.60; Labs 1/15 showed PSA=3.19    VitD defic>  He's been on VitD 1000u caps taking 6/d; labs 9/13 showed Vit d = 32... Continue same.     Borderline Iron> Labs 1/15 at [facility] showed Hg=14.6, Fe=50 (11%sat), Ferritin=15 and he was started on FeSO4 daily along w/ his MVI & VitD    Anxiety/ Depression> prev on Lexapro but he stopped on his own & feels he's managing satis w/o medication; but c/o insomnia- wakes at 3am & can't get back to sleep; we discussed strategies... We reviewed prob list, meds, xrays and labs> see below for updates >> ok 2014 Flu vaccine today...  LABS 1/15 at [facility]:  Chems- ok x BS=127;  CBC- wnl;  Fe=50 (11%sat), Ferritin=15 & he has been started on FeSO4 325mg /d... LABS 1/15 here:  FLP- ok on diet alone w/ LDL=113;  A1c=7.4 on Janumet & glimep;  TSH=0.93;  PSA=3.19...   ~  May 11, 2014:  72mo ROV & urgent add-on appt requested for rash on right leg of 1d duration> Victor Garrett states that he was weed-wacking this weekend but wore lots of protection w/ goggles, long sleeves, gloves, long pants and thick socks; didn't see any poison ivy so he was surprized when he broke out in painful blisters on right leg- eval shows a severe L4-5 Shingles eruption on his right leg (worse distally but extends up to post thigh) + we discussed Rx w/ FAMVIR 500mg  Tid x7d, Depo80, Pred Dosepak, Vicodin for pain, & topical Rx w/ mild soapy cleansing/ pat dry/ appy Silvadene cream/ Kerlex dressing w/ paper tape... We discussed Shingles vaccine rec to be given later when fully resolved...  ~  August 11, 2014:  84mo ROV> Victor Garrett went to see the Urologists today due to several months hx of excess urination; they checked his urine & it was +for sugar, subseq fingerstick=260 & he was told to come here;  He is a known diabetic x yrs> +FamHx and he had a CBD stricture w/ subseq Whipple procedurein 2000 at [facility];  He has been on Janumet500-50Bid w/ A1c all <7.0 until 2014 when labs showed BS=167 & A1c=8.1> Glimepiride 2mg  Qam was added and labs improved w/ A1cback down to 7.0;  Last checked 1/15 on both meds w/ BS=127, A1c=7.4 & advised same meds, better diet, &  get wt down... He tells me that he stopped checking his BS at home many months ago and he thought he was only on one DM med (the Janumet); a call to his Pharm showed that he is filling the Janumet regularly but last filling the Glimepiride 2/15- he doesn't know why he stopped?      We reviewed prob list, meds, xrays and labs> see below  for updates >> Given 2015 flu vaccine today... LABS 11/15:  Chems- BS=279, A1c= 11.0.Marland KitchenMarland Kitchen PLAN>>  We reviewed diet & exercise; new Rx for meter & test strips (check once daily); continue Janumet500-50 Bid & ADD GLIMEPIRIDE $RemoveBeforeD'4mg'CsgcnvgtVTWVWf$  Qam; ROV in 1 month (I offered him endocrine consult, he prefers to start [place])...  ~  September 23, 2014:  6wk ROV> when Rainer was here 08/11/14 his BS was 279 & A1c up to 11.0 on Janumet500-50 Bid but he had inexplicably stopped his Glimepiride rx;  We stressed diet & exercise, continued the Janumet & added Glimep$RemoveBefor'4mg'LtyYMZvWAWrZ$ Qam; he has lost 7# in the interim down to 191# today; & he reports that his BS is much improved- meter shows ave BS ~110-120 w/ hi reading at 149; he denies any low sugar reactions; his new complaint today is Abd Pain- occuring in his lower abd, w/ acute episodes lasting 3-34min & a chronic on-goig pain as well; as noted below he has a long hx GI problems followed locally by Victor Garrett & at [facility] by DrPapppas w/ Whipple procedure for CBD stricture in 2000 and another big operation 2013 for biliary outflow dysfunction w/ ELap, cholecystectomy, & hepaticojejunostomy; in addition he has had a perf duodenal ulcer 2004, hemorrhagic gastritis 2006, diverticulosis, IBS, & chronic pancreatitis...  We discussed checking labs today (he is not fasting) and he is asked to f/u w/ GI- Victor Garrett et al for the abd symptoms...     We reviewed prob list, meds, xrays and labs> see below for updates >>   LABS 12/15:  Chems- wnl x BS=145 (improved);  LFTs= wnl;  CBC- wnl;  Amylase & Lipase- wnl;  Sed= 14...          Problem List:     SLEEP APNEA, MILD  (ICD-780.57) - sleep study 1998 w/ RDI 14 & lowest sat 82%;  trial CPAP dc'd by pt; seen by DrClance & DrShoemaker;  sx improved w/ wt loss & currently denies sleep disordered breathing, no snoring, no daytime symptoms, etc...  PAROXYSMAL ATRIAL FIBRILLATION (ICD-427.31) - Hx recurrent PAF transiently after surg on mult occas in past; Treated w/ transient Diltiazem, Coumadin, Amiodarone in the past; Now supposed to be on ASA $Victor Garrett'81mg'Iltty$  & AMIODARONE $RemoveBefo'200mg'OnUAKXwDuhh$ /d & followed by [facility]... ~  baseline EKG w/ NSR, WNL ~  2DEcho 12/04 w/ norm LVF, trace MR/ TR ~  Cardiolite 12/04 neg w/ EF= 62% ~  8/10 hosp- initial EKG= NSR, WNL;  subseq AFib w/ rvr... 2DEcho showed norm LV w/ EF= 55-60%, no wall motion abn, redundant atrial septum, mild dil LA= 90mm, mod MR... ~  5/11:  f/u DrCrenshaw & he stopped the Coumadin w/ switch to ASA $Victor Garrett'81mg'CJeKf$ /d. ~  1/13:  Pre-op Cards eval by DrKlein>   Baseline EKG is wnl- NSR, rate78, no STTW abnormalities...  Event monitor 1/13 for 14d showed predom NSR w/ freq PACs but no AFib etc...  2DEcho 1/13 showed norm LVF w/ EF=55-60% & no wall motion abn, trivial AI & MR, mild LAdil at 51mm...  Myoview 1/13 was a neg study- no ischemia, no infarct, norm LVF w/ EF=52% & no wall motion abn; no CP & no EKG changes... ~  2/13:  He had transient AFib after surg 1/13 & w/ percut drainage of abscess; converted spont to NSR & now maintained on Metoprolol $RemoveBefor'25mg'lpsIyUcMcYWb$ Bid... ~  9/13:  He tells me that [place] stopped his Metoprolol & continued his Amiodarone $RemoveBefore'200mg'uHPHZLUJXzdtM$ /d... ~  3/14:  on Amio200 & followed at [facility] yearly; rhythm  remains regular, we don't have notes but he's due now; pt indicates they stopped his Metoprolol- he denies CP, palpit, dizzy, SOB, edema; exercise= playing some golf.. ~  CXR 9/14 showed norm heart size, clear lungs, nipple shadow at left lung base, mild thoracic spondylosis... ~  1/15: prev on Amio200 & off now, followed at [facility] yearly; we don't have notes but pt indicates they  stopped his Metoprolol & Amio- he denies CP, palpit, dizzy, SOB, edema; exercise= playing some golf. ~  11/15: not on any cardiac meds- denies CP, palpit, dizzy, SOB, edema; rhythm is regular pulse= 76/min BP= 110/60 today...  LOW HDL (ICD-272.5) - he's been trying to control w/ diet + exercise. ~  FLP 7/07 showed TChol 114, TG 92, HDL 29, LDL 67... ~  [place] 10/09 showed TChol 165, TG 166, HDL 37, LDL 95 ~  we discussed checking FASTING labs on return visit... ~  [place] 9/13 showed TChol 173, TG 190, HDL 36, LDL 99... Needs better low fat diet. ~  FLP 3/14 on diet alone showed TChol 170, TG 196, HDL 31, LDL 100  ~  FLP 1/15 on diet alone showed  TChol 169, TG 99, HDL 36, LDL 113  DIABETES MELLITUS, TYPE II (ICD-250.00) - now on JANUMET 50-500 Bid... there is a family hx of diabetes, and he had CBD stricture w/ Whipple procedure 2000...  ~  labs 7/07 showed BS= 113, HgA1c= 5.7 ~  labs 10/09 showed BS= 126 ~  labs 6/10 showed BS= 148, A1c= 8.0.Marland Kitchen. rec> start JANUMET 50-500 Bid... ~  labs 8/10 in hosp showed BS=90-130s & A1c= 6.2 ~  labs 5/11 showed BS= 140, A1c= 6.2 ~  labs 8/11 showed BS= 88, A1c= 6.2 ~  Labs 4/13 on Janumet50/500Bid showed BS= 119, A1c= 6.5.Marland KitchenMarland Kitchen Continue same. ~  Labs 9/13 on Janumet50/500Bid showed BS= 100, A1c= 7.0.Marland KitchenMarland Kitchen Continue same, better diet. ~  Labs 3/14 on Janumet50/500Bid showed BS= 167, A1c= 8.1.Marland KitchenMarland Kitchen Decided to ADD GLIMEPIRIDE $RemoveBeforeD'2mg'xicIfbTbcXiXaT$  Qam... ~  Labs 7/14 on Janumet50/500Bid+Glim2 showed BS= 94, A1c= 7.0 ~  Labs 1/15 on Janumet50/500bid+Glim2 showed BS= 127, A1c= 7.4 ~  11/15:  See above- he stopped Glimep2 2/15 ?why?; labs 11/15 on Janumet500-50Bid showed BS=279, A1c=11.0; advised diet + exercise, start Glim4Qam, continue Janumet500-50Bid ~  12/15: on Janumet500-50Bid & Glimep4; BS= 145 and accuchecks are averaging 110-120 at home... Continue same...  ABDOMINAL PAIN, RECURRENT (ICD-789.00) - long complex hx starting in 2000 w/ abd pain, benign CBD stricture w/ Whipple procedure  at [facility] in 2000;  subseq perf jejunal anastomotic ulcer w/ surg in [place] in 2005;  chr pancreatitis w/ recurrent abd pain & elevated LFT's (hosp in 2008 by GI);  gallstones w/ neg cholangitis work up - all followed by Victor Garrett==> referred back to [place], Victor Garrett. ~  Hosp 8/10 w/ abd pain, nausea, elevated LFTs... gallstones, ?cholecystitis- Rx antibiotics & sent back to [facility]... Victor Garrett planned surg to reconstruct CBD but pt was reluctant... had 2nd opin at [facility] in [place] & they advised against surg at this time... ~  8/11:  biliary & GI problems have been quiet for the past yr, LFT's normal 5/11... ~  1/13:  Pt returned to [facility] w/ further surg by Victor Garrett for biliary outflow dysfunction w/ ELap, cholecystectomy, & hepaticojejunostomy; developed a RUQ abscess that was drained percut 2/13 & treated w/ IV antibiotics=> all resolved... ~  9/13:  He tells me he has been back to [facility] once in the interval; now c/o some intermittent sharp pains  7 needs f/u w/ GI- he will call Victor Garrett... ~  3/14: c/o intermittent abd discomfort, gas/ belching, etc; he discussed by phone w/ Victor Garrett at [place] doubled his meds (Protonix & Creon) & improved; he hasn't been back to Victor Garrett since he told him it was over his head. ~  1/15: notes some continued GI digestive issues w/ eval at [facility] by Victor Garrett & told that his food is being processed slowly; notes epis of abd apin/ bloating that last several minutes & he thinks it's "my stomach dumping or not empyting",  we don't have records from [place] to review, so far no new meds- they have done UGIS, Labs, & plan lower GI in Apr...  ~  12/15: he is c/o some recurrent lower abd pain; Labs are all essentially wnl & he is asked to continue Protonix & Pancrease and follow up w/ his gastroenterologist ASAP...  GASTRITIS, ACUTE (ICD-535.00) - on PROTONIX $RemoveBef'40mg'VECApeQoSR$ /d...last EGD 2/06 by Victor Garrett showed hemorrhagic gastritis... Hx of ULCER, ACUTE GASTROJEJUNAL W/PERF W/O OBST (ICD-534.10) - EGD  in 2004 showed a perforated jejunal ulcer that was oversewn, and a patent gastrojejunostomy... 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 Then had recurrent symptoms that required additional surg as noted above... PANCREATITIS, CHRONIC (ICD-577.1) - on LIPRAM 2 tabs Bid... DIVERTICULOSIS, ASYMPTOMATIC (ICD-562.10) - colonoscopies 11/01 & 2/06 by Victor Garrett w/o other abnormalities seen... IRRITABLE BOWEL SYNDROME (ICD-564.1)  Hx of LIVER FUNCTION TESTS, ABNORMAL (ICD-794.8) -  ~  labs 9/07 showed AlkPhos 84, SGOT 43, SGPT 75 ~  labs 10/09 showed AlkPhos 86, SGOT 30, SGPT 59 ~  labs 6/10 showed AlkPhos 47, SGOT 21, SGPT 19 ~  labs 8/10 hosp admit showed AlkPhos 189, SGOT 885, SGPT 605 & improved over 5d- AlkPhos 109 SGOT 40, SGPT 185. ~  labs 5/11 showed normal LFTs- AlkPhos 46, SGOT 13, SGPT 10 ~  Labs 5/12 showed elev LFTs once again & he saw Victor Garrett & the folks at [facility]... ~  Labs 9/13 showed normal LFTs... ~  Labs 1/15 at [place] showed norm LFTs... ~  Labs 12/15:  LFTs are all wnl...  ELEVATED PROSTATE SPECIFIC ANTIGEN (ICD-790.93) - followed by Victor Garrett for Urology w/ elev PSA- neg bx 12/09 & he continues to follow every 5mo...  ~  Labs 9/13 showed PSA= 2.60 ~  Labs here 1/15 showed PSA= 3.19  ANXIETY (ICD-300.00)& DEPRESSION - mult stressors and he's been feeling down since the 8/10 hosp... now separated & going through divorce...we discussed Rx w/ LEXAPRO $RemoveBe'10mg'UFkJQinfp$ /d & he stopped on his own 7/11...  SHINGLES >> presented 8/15 w/ blistering Shingles rash on right leg in L4-5 distrib... Treated w/ Famvir, Depo/Pred dosepak, topical cleansing & Silvadene dressings => resolved...   Past Surgical History  Procedure Laterality Date  . Whipple procedure  2000    by Dr. Dossie Der at [facility].- benign stricture in common bile duct, path showed fibrosis & nflammation  . Elap w/ suture of perf jejunal ulcer & drailage of lesser sac abscess  11/04    in [facility]  . F/u ercp  7/05    Dr. Harl Bowie at [facility] with sludge vs stones near junction of CBD & cystic duct, no stone in the GB    Outpatient Encounter Prescriptions as of 09/23/2014  Medication Sig  . Blood Glucose Monitoring Suppl (ACCU-CHEK AVIVA PLUS) W/DEVICE KIT TEST BLOOD SUGAR ONCE DAILY  . Blood Glucose Monitoring Suppl (ACCU-CHEK AVIVA PLUS) W/DEVICE KIT TEST BLOOD  SUGAR ONCE DAILY  . cholecalciferol (VITAMIN D) 1000 UNITS tablet Take 6 tablets by mouth daily  . glimepiride (AMARYL) 4 MG tablet Take 1 tablet (4 mg total) by mouth daily with breakfast.  . glucose blood (ACCU-CHEK ACTIVE STRIPS) test strip Test blood sugar once daily  . HYDROcodone-acetaminophen (NORCO/VICODIN) 5-325 MG per tablet Take 1 tablet by mouth 3 (three) times daily as needed for moderate pain.  Marland Kitchen JANUMET 50-500 MG per tablet TAKE 1 TABLET BY MOUTH TWICE DAILY WITH FOOD  . Pancrelipase, Lip-Prot-Amyl, (CREON) 24000 UNITS CPEP Take 2 capsules (48,000 Units total) by mouth daily. With meal  . pantoprazole (PROTONIX) 40 MG tablet Take 1 tablet (40 mg total) by mouth daily.    Allergies  Allergen Reactions  . Penicillins     REACTION: pt states "lethargy"    Current Medications, Allergies, Past Medical History, Past Surgical History, Family History, and Social History were reviewed in [employer] record.   Review of Systems        See HPI - all other systems neg except as noted...       The patient complains of weight loss and dyspnea on exertion.  The patient denies anorexia, fever, weight gain, vision loss, decreased hearing, hoarseness, chest pain, syncope, peripheral edema, prolonged cough, headaches, hemoptysis, abdominal pain, melena, hematochezia, severe indigestion/heartburn, hematuria, incontinence, muscle weakness, suspicious skin lesions, transient blindness, difficulty walking, depression, unusual weight change, abnormal bleeding, enlarged lymph nodes, and angioedema.      Objective:   Physical Exam     WD, WN, 63 y/o WM in NAD.Marland Kitchen.  sl depressed mood is evident... GENERAL:  Alert & oriented; pleasant & cooperative... HEENT:  [place]/AT, PERRLA, EOM- full, EACs-clear, TMs-wnl, NOSE- wnl, THROAT-clear & wnl. NECK:  Supple w/ fairROM; no JVD; normal carotid impulses w/o bruits; no thyromegaly or nodules palpated; no lymphadenopathy. CHEST:  Clear to P & A, no wheezing/ rales/ rhonchi... HEART:  regular rhythm, gr 1-2 MR, no rubs or gallops detected... ABDOMEN:  Soft & nontender; same surg scar; normal bowel sounds; no organomegaly or masses palpated  EXT: without deformities, mild arthritic changes; no varicose veins/ venous insuffic/ or edema. DERM:  Extensive blistering rash on right leg in distrib of L4-5 dermatome w/ blisters on top of foot; he has mild vitiligo...  RADIOLOGY DATA:  Reviewed in the EPIC EMR & discussed w/ the patient...  LABORATORY DATA:  Reviewed in the EPIC EMR & discussed w/ the patient...   Assessment:      DM>  On Janumet 50-500Bid & we added Glimep4; BS=145 and he has lost 7#, keep up the same meds and the good work...   Hx PAF>  See extensive pre-op cards eval by DrKlein 1/13; he had PAF post op & [facility] & seen by DrSchocken on Amio & Metoprolol; we don't have follow up notes but pt indicates that they stopped his meds 7 on observation now w/o recurrent AFib...  Low HDL Cholesterol>  FLP improved on diet + exercise, needs to lose the wt...  Biliary Tract Disease>  Supposed to be on Creon 2 Tid w/ meals; he had Whipple in 2000 for benign stricture; and 1/13 had ELap, cholecystectomy, & hepaticojejunostomy for biliary outflow dysfunction;> all by Victor Garrett at [facility]...  Other GI problems as noted above>  On PROTONIX;  Victor Garrett is his local GI...  Hx BPH, BOO, Elev PSA>  All followed by Victor Garrett for Urology...  Anxiety & Depression>  Not currently on meds, he stopped his  prev Lexapro...  SHINGLES Right L4-5 distrib>>  Treated w/  Famvir, Depo/Pred dosepak, topical cleansing & Silvadene dressings => resolved.     Plan:     Patient's Medications  New Prescriptions   No medications on file  Previous Medications   BLOOD GLUCOSE MONITORING SUPPL (ACCU-CHEK AVIVA PLUS) W/DEVICE KIT    TEST BLOOD SUGAR ONCE DAILY   CHOLECALCIFEROL (VITAMIN D) 1000 UNITS TABLET    Take 6 tablets by mouth daily   GLIMEPIRIDE (AMARYL) 4 MG TABLET    Take 1 tablet (4 mg total) by mouth daily with breakfast.   GLUCOSE BLOOD (ACCU-CHEK ACTIVE STRIPS) TEST STRIP    Test blood sugar once daily   HYDROCODONE-ACETAMINOPHEN (NORCO/VICODIN) 5-325 MG PER TABLET    Take 1 tablet by mouth 3 (three) times daily as needed for moderate pain.   JANUMET 50-500 MG PER TABLET    TAKE 1 TABLET BY MOUTH TWICE DAILY WITH FOOD   PANCRELIPASE, LIP-PROT-AMYL, (CREON) 24000 UNITS CPEP    Take 2 capsules (48,000 Units total) by mouth daily. With meal   PANTOPRAZOLE (PROTONIX) 40 MG TABLET    Take 1 tablet (40 mg total) by mouth daily.  Modified Medications   No medications on file  Discontinued Medications   BLOOD GLUCOSE MONITORING SUPPL (ACCU-CHEK AVIVA PLUS) W/DEVICE KIT    TEST BLOOD SUGAR ONCE DAILY

## 2014-09-23 NOTE — Patient Instructions (Signed)
Today we updated your med list in our EPIC system...    Continue your current medications the same...  Today we checked your follow up labs to check for a cause of the abd pain...    We will contact you w/ the results when available...     If symptoms worsen- go to the ER for GI consultation (DrBuccini etal)...  Watch your diet- eat soft bland foods & liquids...    And of course- stay away from CARBS- (use low glycemic index foods)...  Call for any questions...  Let's plan a follow up visit in 74mo, sooner if needed for problems.Marland Kitchen..Marland Kitchen

## 2014-10-01 ENCOUNTER — Telehealth: Payer: Self-pay | Admitting: Pulmonary Disease

## 2014-10-01 NOTE — Telephone Encounter (Signed)
Called and spoke with pt and he is aware of lab results per SN. Pt voiced his understanding and nothing further is needed. 

## 2014-10-21 ENCOUNTER — Other Ambulatory Visit: Payer: Self-pay | Admitting: Pulmonary Disease

## 2014-10-30 ENCOUNTER — Ambulatory Visit: Payer: BC Managed Care – PPO | Admitting: Pulmonary Disease

## 2015-01-05 ENCOUNTER — Telehealth: Payer: Self-pay | Admitting: Pulmonary Disease

## 2015-01-05 MED ORDER — PANCRELIPASE (LIP-PROT-AMYL) 24000-76000 UNITS PO CPEP: 2.0000 | ORAL_CAPSULE | Freq: Every day | ORAL | Status: DC

## 2015-01-05 MED ORDER — PANTOPRAZOLE SODIUM 40 MG PO TBEC: 40.0000 mg | DELAYED_RELEASE_TABLET | Freq: Every day | ORAL | Status: DC

## 2015-01-05 MED ORDER — GLIMEPIRIDE 4 MG PO TABS: 4.0000 mg | ORAL_TABLET | Freq: Every day | ORAL | Status: DC

## 2015-01-05 NOTE — Telephone Encounter (Signed)
Refills have been sent in. Pt is aware. While on the phone, pt also needed a refill on another medication.

## 2015-01-13 ENCOUNTER — Other Ambulatory Visit: Payer: Self-pay | Admitting: Pulmonary Disease

## 2015-01-13 MED ORDER — ALPRAZOLAM 0.5 MG PO TABS: 0.5000 mg | ORAL_TABLET | Freq: Three times a day (TID) | ORAL | Status: DC | PRN

## 2015-02-03 ENCOUNTER — Other Ambulatory Visit: Payer: Self-pay | Admitting: Pulmonary Disease

## 2015-03-29 ENCOUNTER — Other Ambulatory Visit: Payer: Self-pay

## 2015-04-04 ENCOUNTER — Other Ambulatory Visit: Payer: Self-pay | Admitting: Pulmonary Disease

## 2015-04-06 ENCOUNTER — Other Ambulatory Visit: Payer: Self-pay | Admitting: Pulmonary Disease

## 2015-04-06 DIAGNOSIS — IMO0002 Reserved for concepts with insufficient information to code with codable children: Secondary | ICD-10-CM

## 2015-04-06 DIAGNOSIS — E1165 Type 2 diabetes mellitus with hyperglycemia: Secondary | ICD-10-CM

## 2015-04-08 ENCOUNTER — Ambulatory Visit (INDEPENDENT_AMBULATORY_CARE_PROVIDER_SITE_OTHER): Payer: BLUE CROSS/BLUE SHIELD | Admitting: Internal Medicine

## 2015-04-08 ENCOUNTER — Other Ambulatory Visit (INDEPENDENT_AMBULATORY_CARE_PROVIDER_SITE_OTHER): Payer: BLUE CROSS/BLUE SHIELD | Admitting: *Deleted

## 2015-04-08 ENCOUNTER — Encounter: Payer: Self-pay | Admitting: Internal Medicine

## 2015-04-08 VITALS — BP 118/62 | HR 72 | Temp 98.5°F | Resp 12 | Ht 70.5 in | Wt 190.0 lb

## 2015-04-08 DIAGNOSIS — IMO0002 Reserved for concepts with insufficient information to code with codable children: Secondary | ICD-10-CM

## 2015-04-08 DIAGNOSIS — E1165 Type 2 diabetes mellitus with hyperglycemia: Secondary | ICD-10-CM | POA: Diagnosis not present

## 2015-04-08 LAB — POCT GLYCOSYLATED HEMOGLOBIN (HGB A1C): Hemoglobin A1C: 7.4

## 2015-04-08 MED ORDER — GLIPIZIDE ER 5 MG PO TB24: 5.0000 mg | ORAL_TABLET | Freq: Every day | ORAL | Status: DC

## 2015-04-08 MED ORDER — SITAGLIPTIN PHOS-METFORMIN HCL 50-500 MG PO TABS: 2.0000 | ORAL_TABLET | Freq: Every day | ORAL | Status: DC

## 2015-04-08 MED ORDER — METFORMIN HCL 1000 MG PO TABS: 1000.0000 mg | ORAL_TABLET | Freq: Every day | ORAL | Status: DC

## 2015-04-08 NOTE — Patient Instructions (Signed)
Please continue JanuMet but take 2 tablets [phone] mg in am). Add Metformin 1000 mg with dinner. Stop Glimepiride. Start Glipizide XL 5 mg in am.  Please call and schedule an eye appt with Dr. Randon GoldsmithLyles: [facility]:  Dr. Jeni SallesLyles Graham W. MD ?  Address: [address], [place], [place] [address]  [phone]  Please return in 1.5 month with your sugar log.   PATIENT INSTRUCTIONS FOR TYPE 2 DIABETES:  **Please join MyChart!** - see attached instructions about how to join if you have not done so already.  DIET AND EXERCISE Diet and exercise is an important part of diabetic treatment.  We recommended aerobic exercise in the form of brisk walking (working between 40-60% of maximal aerobic capacity, similar to brisk walking) for 150 minutes per week (such as 30 minutes five days per week) along with 3 times per week performing 'resistance' training (using various gauge rubber tubes with handles) 5-10 exercises involving the major muscle groups (upper body, lower body and core) performing 10-15 repetitions (or near fatigue) each exercise. Start at half the above goal but build slowly to reach the above goals. If limited by weight, joint pain, or disability, we recommend daily walking in a swimming pool with water up to waist to reduce pressure from joints while allow for adequate exercise.    BLOOD GLUCOSES Monitoring your blood glucoses is important for continued management of your diabetes. Please check your blood glucoses 2-4 times a day: fasting, before meals and at bedtime (you can rotate these measurements - e.g. one day check before the 3 meals, the next day check before 2 of the meals and before bedtime, etc.).   HYPOGLYCEMIA (low blood sugar) Hypoglycemia is usually a reaction to not eating, exercising, or taking too much insulin/ other diabetes drugs.  Symptoms include tremors, sweating, hunger, confusion, headache, etc. Treat IMMEDIATELY with 15 grams of  Carbs: . 4 glucose tablets .  cup regular juice/soda . 2 tablespoons raisins . 4 teaspoons sugar . 1 tablespoon honey Recheck blood glucose in 15 mins and repeat above if still symptomatic/blood glucose <100.  RECOMMENDATIONS TO REDUCE YOUR RISK OF DIABETIC COMPLICATIONS: * Take your prescribed MEDICATION(S) * Follow a DIABETIC diet: Complex carbs, fiber rich foods, (monounsaturated and polyunsaturated) fats * AVOID saturated/trans fats, high fat foods, >2,300 mg salt per day. * EXERCISE at least 5 times a week for 30 minutes or preferably daily.  * DO NOT SMOKE OR DRINK more than 1 drink a day. * Check your FEET every day. Do not wear tightfitting shoes. Contact us if you develop an ulcer * See your EYE doctor once a year or more if needed * Get a FLU shot once a year * Get a PNEUMONIA vaccine once before and once after age 165 years  GOALS:  * Your Hemoglobin A1c of <7%  * fasting sugars need to be <130 * after meals sugars need to be <180 (2h after you start eating) * Your Systolic BP should be 140 or lower  * Your Diastolic BP should be 80 or lower  * Your HDL (Good Cholesterol) should be 40 or higher  * Your LDL (Bad Cholesterol) should be 100 or lower. * Your Triglycerides should be 150 or lower  * Your Urine microalbumin (kidney function) should be <30 * Your Body Mass Index should be 25 or lower    Please consider the following ways to cut down carbs and fat and increase fiber and micronutrients in your diet: -  substitute whole grain for white bread or pasta - substitute brown rice for white rice - substitute 90-calorie flat bread pieces for slices of bread when possible - substitute sweet potatoes or yams for white potatoes - substitute humus for margarine - substitute tofu for cheese when possible - substitute almond or rice milk for regular milk (would not drink soy milk daily due to concern for soy estrogen influence on breast cancer risk) - substitute dark  chocolate for other sweets when possible - substitute water - can add lemon or orange slices for taste - for diet sodas (artificial sweeteners will trick your body that you can eat sweets without getting calories and will lead you to overeating and weight gain in the long run) - do not skip breakfast or other meals (this will slow down the metabolism and will result in more weight gain over time)  - can try smoothies made from fruit and almond/rice milk in am instead of regular breakfast - can also try old-fashioned (not instant) oatmeal made with almond/rice milk in am - order the dressing on the side when eating salad at a restaurant (pour less than half of the dressing on the salad) - eat as little meat as possible - can try juicing, but should not forget that juicing will get rid of the fiber, so would alternate with eating raw veg./fruits or drinking smoothies - use as little oil as possible, even when using olive oil - can dress a salad with a mix of balsamic vinegar and lemon juice, for e.g. - use agave nectar, stevia sugar, or regular sugar rather than artificial sweateners - steam or broil/roast veggies  - snack on veggies/fruit/nuts (unsalted, preferably) when possible, rather than processed foods - reduce or eliminate aspartame in diet (it is in diet sodas, chewing gum, etc) Read the labels!  Try to read Dr. Janene Harvey book: "Program for Reversing Diabetes" for other ideas for healthy eating.

## 2015-04-08 NOTE — Progress Notes (Signed)
Patient ID: Victor Garrett, male   DOB: 11-30-1950, 64 y.o.   MRN: [id number]  HPI: Victor Garrett is a 64 y.o.-year-old male, referred by his PCP, Dr. Lenna Garrett, for management of DM2, dx 2008 - had Whipple procedure at [facility] in 2000 - removed 50% of the pancreas), non-insulin-dependent, uncontrolled, without complications. He is here with his wife, who is also a patient of mine, and offers part of the history.  Last hemoglobin A1c was: Lab Results  Component Value Date   HGBA1C 11.0* 08/11/2014   HGBA1C 7.4* 10/27/2013   HGBA1C 7.0* 04/21/2013  Before last HbA1c, he has been eating a lot of fruit.  Pt is on a regimen of: - JanuMet 50-500 mg bid - Glimepiride 4 mg in am (increased in 07/2014) He had several episodes of pancreatitis after this up untill 2005-6.  Pt checks his sugars 0-1x a day and they are: - am: 130-160 - 2h after b'fast: n/c - before lunch: n/c - 2h after lunch: n/c - before dinner: n/c - 2h after dinner: n/c - bedtime: n/c - nighttime: n/c No lows. Lowest sugar was 119; ? hypoglycemia awareness. He feels lightheaded sometimes but did not check  Highest sugar was 163.  Glucometer: Accu Check [employer]iva  Pt's meals are: - Breakfast: bagel or oatmeal + fruit - Lunch: sandwich - Dinner: eats out every night - Snacks: He does not eat large meals, but eats small meals   - no CKD, last BUN/creatinine:  Lab Results  Component Value Date   BUN 19 09/23/2014   CREATININE 0.9 09/23/2014   he is not on ACE inhibitor's. - last set of lipids: Lab Results  Component Value Date   CHOL 169 10/27/2013   HDL 36.00* 10/27/2013   LDLCALC 113* 10/27/2013   TRIG 99.0 10/27/2013   CHOLHDL 5 10/27/2013   he is not on a statin. - No previous dilated eye exams! - no numbness and tingling in his feet.  Pt has FH of DM in [place], father, cousin.  He was suspected to have pancreatic cancer >> Whipple procedure in 2000 >> but biopsy returned negative for cancer, only pancreatitis.  He had a perforated ulcer in 2004. He also has paroxysmal Afib.  ROS: Constitutional: no weight gain/loss, no fatigue, no subjective hyperthermia/hypothermia, + nocturia Eyes: no blurry vision, no xerophthalmia ENT: no sore throat, no nodules palpated in throat, no dysphagia/odynophagia, no hoarseness Cardiovascular: no CP/SOB/palpitations/leg swelling Respiratory: no cough/SOB Gastrointestinal: no N/V/D/C, + acid reflux, + periodic of abdominal pain sharp, associated with belching, and also with a heat wave that escalates to his face Musculoskeletal: + muscle/no joint aches Skin: no rashes Neurological: no tremors/numbness/tingling/dizziness Psychiatric: no depression/anxiety  Past Medical History  Diagnosis Date  . Diabetes mellitus   . Sleep apnea   . Paroxysmal atrial fibrillation   . Mitral regurgitation   . Low HDL (under 40)   . Gastritis, acute   . Abdominal pain     recurrent  . Acute gastrojejunal ulcer with perforation but without obstruction   . Diverticula of colon   . Gallstones   . Irritable bowel syndrome   . Bile duct stricture     hx  . Pancreatitis     chronic  . Abnormal liver function test     hx  . Elevated prostate specific antigen (PSA)   . Anxiety   . Depressed    Past Surgical History  Procedure Laterality Date  . Whipple procedure  2000  by Dr. Dossie Garrett at [facility].- benign stricture in common bile duct, path showed fibrosis & nflammation  . Elap w/ suture of perf jejunal ulcer & drailage of lesser sac abscess  11/04    in [facility]  . F/u ercp  7/05    Dr. Harl Garrett at [facility] with sludge vs stones near junction of CBD & cystic duct, no stone in the GB   History   Social History  . Marital Status: Married    Spouse Name: N/A  . Children: yes   Occupational History  .  sales    Social History Main Topics  . Smoking status: Never Smoker   . Smokeless tobacco: No     Comment: nonsmoker  . Alcohol Use: No  . Drug Use: No   Social History  Narrative   Exercises= walks some. No caffeine use.    Current Outpatient Prescriptions on File Prior to Visit  Medication Sig Dispense Refill  . Blood Glucose Monitoring Suppl (ACCU-CHEK AVIVA PLUS) W/DEVICE KIT TEST BLOOD SUGAR ONCE DAILY 1 kit 0  . cholecalciferol (VITAMIN D) 1000 UNITS tablet Take 6 tablets by mouth daily 90 tablet 5  . CREON 24000 UNITS CPEP TAKE 2 CAPSULES BY MOUTH ONCE DAILY WITH A MEAL 60 capsule 0  . glucose blood (ACCU-CHEK ACTIVE STRIPS) test strip Test blood sugar once daily 100 each 12  . pantoprazole (PROTONIX) 40 MG tablet TAKE 1 TABLET(40 MG) BY MOUTH DAILY 30 tablet 0  . ALPRAZolam (XANAX) 0.5 MG tablet Take 1 tablet (0.5 mg total) by mouth 3 (three) times daily as needed for anxiety. (Patient not taking: Reported on 04/08/2015) 90 tablet 5  . HYDROcodone-acetaminophen (NORCO/VICODIN) 5-325 MG per tablet Take 1 tablet by mouth 3 (three) times daily as needed for moderate pain. (Patient not taking: Reported on 04/08/2015) 50 tablet 0   No current facility-administered medications on file prior to visit.   Allergies  Allergen Reactions  . Penicillins     REACTION: pt states "lethargy"   Family History  Problem Relation Age of Onset  . Brain cancer Mother   . Asthma Mother   . Emphysema Mother   . Coronary artery disease Father     previous, began in his 66s    PE: BP 118/62 mmHg  Pulse 72  Temp(Src) 98.5 F (36.9 C) (Oral)  Resp 12  Ht 5' 10.5" (1.791 m)  Wt 190 lb (86.183 kg)  BMI 26.87 kg/m2  SpO2 97%  Wt Readings from Last 3 Encounters:  04/08/15 190 lb (86.183 kg)  09/23/14 190 lb 12.8 oz (86.546 kg)  08/11/14 198 lb 6.4 oz (89.994 kg)   Constitutional: Normal weight, in NAD Eyes: PERRLA, EOMI, no exophthalmos ENT: moist mucous membranes, no thyromegaly, no cervical lymphadenopathy Cardiovascular: RRR, No MRG Respiratory: CTA B Gastrointestinal: abdomen soft, NT, ND, BS+ Musculoskeletal: no deformities, strength intact in all 4 Skin:  moist, warm, no rashes, + vitiligo on hands Neurological: no tremor with outstretched hands, DTR normal in all 4  ASSESSMENT: 1. DM2, non-insulin-dependent, uncontrolled, without complications  PLAN:  1. Patient with long-standing, uncontrolled diabetes, on oral antidiabetic regimen, which became insufficient. His sugars are higher than target in the morning, but indicating a lower hemoglobin A1c that the last one existing in the system, of 11% from the end of last year. We checked the point of care hemoglobin A1c during this visit and this returned at 7.4%, indeed, confirming improvement in his diabetes control. He reduced the amount of high glycemic  index fruit since the last hemoglobin A1c. - We discussed at length about nutrition, high glycemic index versus low glycemic index food, the way to organize eating his fruits so that he does not have high glycemic excursions after meals. More suggestions were given to him in a handout -see patient instructions.  - we discussed about the fact that he is on Januvia and this is not an optimal medication for him since he has a history of pancreatitis. He does not feel that he had more pancreatitis episodes after he started Januvia. She would like to continue this medicine. We will continue this, but cautiously. - I suggested to move the entire dose of Januvia in the morning, and increase his metformin 1000 mg twice a day. We'll also switch from the biparietal glipizide extended-release, since he has some symptoms suggesting hypoglycemia during the day, especially after exertion. I strongly advised him to check his sugars when this happens. - We discussed about options for treatment, and I suggested to:  Patient Instructions  Please continue JanuMet but take 2 tablets ([phone] mg in am). Add Metformin 1000 mg with dinner. Stop Glimepiride. Start Glipizide XL 5 mg in am.  Please call and schedule an eye appt with Dr. Prudencio Burly: [facility]:  Dr. Sherlyn Lick MD ?  Address: [address], [place], [place] [address]  [phone]  Please return in 1.5 month with your sugar log.   - Strongly advised him to start checking sugars at different times of the day - check 1x a day, rotating checks - given sugar log and advised how to fill it and to bring it at next appt  - given foot care handout and explained the principles  - given instructions for hypoglycemia management "15-15 rule"  - advised for yearly eye exams >> suggested to start seeing Dr. Prudencio Burly - Return to clinic in 1.5 mo with sugar log

## 2015-04-14 ENCOUNTER — Other Ambulatory Visit: Payer: Self-pay | Admitting: Pulmonary Disease

## 2015-04-27 ENCOUNTER — Telehealth: Payer: Self-pay | Admitting: Internal Medicine

## 2015-04-27 NOTE — Telephone Encounter (Signed)
Pt returned call. He states that he has not been sleeping well and is restless. He said his blood sugar has been up and down. Most days are around 150. Yesterday was 181, 2 hrs after lunch and last Sat it was 241 after lunch. Pt stated his highs seem to be after lunch. He has had some tingling of the feet, but last Sat it was the worst ever. They are feeling fine now. PT wants to know if Dr Elvera Lennox feels the sx are attributed to the med change? Please advise.

## 2015-04-27 NOTE — Telephone Encounter (Signed)
If sugars persist high (and no lows), he can increase Glipizide Xl to 10 mg in am. I do not think that his sxs are 2/2 to med change at last visit.

## 2015-04-27 NOTE — Telephone Encounter (Signed)
Pt has experienced tingling in his feet and one blood sugar reading over 200 on saturday

## 2015-04-27 NOTE — Telephone Encounter (Signed)
Called pt and lvm asking pt to return call. Need more information.

## 2015-04-27 NOTE — Telephone Encounter (Signed)
Called pt and advised him per Dr Gherghe's message. Pt voiced understanding.  

## 2015-05-05 ENCOUNTER — Other Ambulatory Visit: Payer: Self-pay | Admitting: Pulmonary Disease

## 2015-05-24 ENCOUNTER — Ambulatory Visit (INDEPENDENT_AMBULATORY_CARE_PROVIDER_SITE_OTHER): Payer: BLUE CROSS/BLUE SHIELD | Admitting: Internal Medicine

## 2015-05-24 ENCOUNTER — Encounter: Payer: Self-pay | Admitting: Internal Medicine

## 2015-05-24 VITALS — BP 100/64 | HR 78 | Temp 98.5°F | Wt 185.0 lb

## 2015-05-24 DIAGNOSIS — E1165 Type 2 diabetes mellitus with hyperglycemia: Secondary | ICD-10-CM

## 2015-05-24 MED ORDER — SITAGLIPTIN PHOS-METFORMIN HCL 50-500 MG PO TABS: ORAL_TABLET | ORAL | Status: DC

## 2015-05-24 NOTE — Progress Notes (Signed)
Patient ID: Victor Garrett, male   DOB: [birth year], 64 y.o.   MRN: [id number]  HPI: Victor Garrett is a 64 y.o.-year-old male, returning for f/u for DM2, dx 2008 - had Whipple procedure at [facility] in 2000 - removed 50% of the pancreas), non-insulin-dependent, uncontrolled, without complications. Last visit 1.5 mo ago.  Last hemoglobin A1c was: Lab Results  Component Value Date   HGBA1C 7.4 04/08/2015   HGBA1C 11.0* 08/11/2014   HGBA1C 7.4* 10/27/2013  Before last HbA1c, he has been eating a lot of fruit.  Pt was on a regimen of: - JanuMet 50-500 mg bid - Glimepiride 4 mg in am (increased in 07/2014) He had several episodes of pancreatitis after this up untill 2005-6.  He is now on: - Janumet 50-500 x2 tablets in a.m. (he also restarted 1 tab of JanuMet at night!)... - Metformin 1000 mg at dinnertime They do not think he started Glipizide XL 10 mg in a.m., but did stop Amaryl...  Pt checks his sugars 2x a day and they are: - am: 130-160 >> 127-146 - 2h after b'fast: n/c >> 156-169, 175 - before lunch: n/c >> 117-159, 169, 191 - 2h after lunch: n/c >> 149-172, 200 - before dinner: n/c >> 156-164 - 2h after dinner: n/c >> 134-187, 214 - bedtime: n/c >> 130-161 - nighttime: n/c No lows. Lowest sugar was 117; ? hypoglycemia awareness. He feels lightheaded sometimes but did not check  Highest sugar was 214 (eggs benedict).  Glucometer: Accu Check [employer]iva  Pt's meals are: - Breakfast: bagel or oatmeal + fruit - Lunch: sandwich - Dinner: eats out every night - Snacks: He does not eat large meals, but eats small meals   - no CKD, last BUN/creatinine:  Lab Results  Component Value Date   BUN 19 09/23/2014   CREATININE 0.9 09/23/2014   he is not on ACE inhibitor's. - last set of lipids: Lab Results  Component Value Date   CHOL 169 10/27/2013   HDL 36.00* 10/27/2013   LDLCALC 113* 10/27/2013   TRIG 99.0 10/27/2013   CHOLHDL 5 10/27/2013   he is not on a statin. - No  previous dilated eye exams! - no numbness and tingling in his feet.  He was suspected to have pancreatic cancer >> Whipple procedure in 2000 >> but biopsy returned negative for cancer, only pancreatitis. He had a perforated ulcer in 2004. He also has paroxysmal Afib.  ROS: Constitutional: no weight gain/loss, no fatigue, no subjective hyperthermia/hypothermia, + nocturia Eyes: no blurry vision, no xerophthalmia ENT: no sore throat, no nodules palpated in throat, no dysphagia/odynophagia, no hoarseness Cardiovascular: no CP/SOB/palpitations/leg swelling Respiratory: no cough/SOB Gastrointestinal: no N/V/D/C/acid reflux Musculoskeletal: no muscle/no joint aches Skin: no rashes Neurological: no tremors/numbness/tingling/dizziness  I reviewed pt's medications, allergies, PMH, social hx, family hx, and changes were documented in the history of present illness. Otherwise, unchanged from my initial visit note:  Past Medical History  Diagnosis Date  . Diabetes mellitus   . Sleep apnea   . Paroxysmal atrial fibrillation   . Mitral regurgitation   . Low HDL (under 40)   . Gastritis, acute   . Abdominal pain     recurrent  . Acute gastrojejunal ulcer with perforation but without obstruction   . Diverticula of colon   . Gallstones   . Irritable bowel syndrome   . Bile duct stricture     hx  . Pancreatitis     chronic  . Abnormal liver function test  hx  . Elevated prostate specific antigen (PSA)   . Anxiety   . Depressed    Past Surgical History  Procedure Laterality Date  . Whipple procedure  2000    by Dr. Dossie Der at [facility].- benign stricture in common bile duct, path showed fibrosis & nflammation  . Elap w/ suture of perf jejunal ulcer & drailage of lesser sac abscess  11/04    in [facility]  . F/u ercp  7/05    Dr. Harl Bowie at [facility] with sludge vs stones near junction of CBD & cystic duct, no stone in the GB   History   Social History  . Marital Status: Married    Spouse  Name: N/A  . Children: yes   Occupational History  .  sales    Social History Main Topics  . Smoking status: Never Smoker   . Smokeless tobacco: No     Comment: nonsmoker  . Alcohol Use: No  . Drug Use: No   Social History Narrative   Exercises= walks some. No caffeine use.    Current Outpatient Prescriptions on File Prior to Visit  Medication Sig Dispense Refill  . ALPRAZolam (XANAX) 0.5 MG tablet Take 1 tablet (0.5 mg total) by mouth 3 (three) times daily as needed for anxiety. (Patient not taking: Reported on 04/08/2015) 90 tablet 5  . Blood Glucose Monitoring Suppl (ACCU-CHEK AVIVA PLUS) W/DEVICE KIT TEST BLOOD SUGAR ONCE DAILY 1 kit 0  . cholecalciferol (VITAMIN D) 1000 UNITS tablet Take 6 tablets by mouth daily 90 tablet 5  . CREON 24000 UNITS CPEP TAKE 2 CAPSULES BY MOUTH ONCE DAILY WITH A MEAL 60 capsule 3  . glimepiride (AMARYL) 4 MG tablet TAKE 1 TABLET(4 MG) BY MOUTH DAILY WITH BREAKFAST 30 tablet 0  . glipiZIDE (GLIPIZIDE XL) 5 MG 24 hr tablet Take 1 tablet (5 mg total) by mouth daily with breakfast. 60 tablet 1  . glucose blood (ACCU-CHEK ACTIVE STRIPS) test strip Test blood sugar once daily 100 each 12  . HYDROcodone-acetaminophen (NORCO/VICODIN) 5-325 MG per tablet Take 1 tablet by mouth 3 (three) times daily as needed for moderate pain. (Patient not taking: Reported on 04/08/2015) 50 tablet 0  . JANUMET 50-500 MG per tablet TAKE 1 TABLET BY MOUTH TWICE DAILY WITH FOOD 60 tablet 3  . metFORMIN (GLUCOPHAGE) 1000 MG tablet Take 1 tablet (1,000 mg total) by mouth daily with supper. 60 tablet 2  . pantoprazole (PROTONIX) 40 MG tablet TAKE 1 TABLET(40 MG) BY MOUTH DAILY 30 tablet 0  . sitaGLIPtin-metformin (JANUMET) 50-500 MG per tablet Take 2 tablets by mouth daily with breakfast. 60 tablet 2   No current facility-administered medications on file prior to visit.   Allergies  Allergen Reactions  . Penicillins     REACTION: pt states "lethargy"   Family History  Problem  Relation Age of Onset  . Brain cancer Mother   . Asthma Mother   . Emphysema Mother   . Coronary artery disease Father     previous, began in his 9s   PE: BP 100/64 mmHg  Pulse 78  Temp(Src) 98.5 F (36.9 C)  Wt 185 lb (83.915 kg) Body mass index is 26.16 kg/(m^2). Wt Readings from Last 3 Encounters:  05/24/15 185 lb (83.915 kg)  04/08/15 190 lb (86.183 kg)  09/23/14 190 lb 12.8 oz (86.546 kg)   Constitutional: Normal weight, in NAD Eyes: PERRLA, EOMI, no exophthalmos ENT: moist mucous membranes, no thyromegaly, no cervical lymphadenopathy Cardiovascular: RRR, No MRG  Respiratory: CTA B Gastrointestinal: abdomen soft, NT, ND, BS+ Musculoskeletal: no deformities, strength intact in all 4 Skin: moist, warm, no rashes, + vitiligo on hands Neurological: no tremor with outstretched hands, DTR normal in all 4  ASSESSMENT: 1. DM2, non-insulin-dependent, uncontrolled, without complications  PLAN:  1. Patient with long-standing, improving diabetes, on oral antidiabetic regimen, however, a different one that I recommended at last visit >> he stopped Amaryl but did not start Glipizide XL >> sugars higher >> added another JanuMet at night...  - we discussed about the fact that he is on Januvia and this is not an optimal medication for him (especially at 50% higher daily recommended dose!)since he has a history of pancreatitis. He does not feel that he had more pancreatitis episodes after he started Januvia. She would like to continue this medicine. We will continue this, but decrease the dose to max dose per day. - Will also add Glipizide XL. - I suggested to:  Patient Instructions   Please continue JanuMet but take ONLY 2 tablets [phone]) with breakfast  Take Metformin 1000 mg with dinner.  Start Glipizide XL 5 mg in am. If sugars are still higher than target, please let me know, we may need to increase the dose to 10 mg in am.  Please return in 1.5 months with your sugar log  (after 06/09/2015).  - continue checking sugars at different times of the day - check 1-2x a day, rotating checks - advised for yearly eye exams >> suggested to start seeing Dr. Prudencio Burly >> has an appt in 07/2015 - Return to clinic in 1.5 mo with sugar log

## 2015-05-24 NOTE — Patient Instructions (Addendum)
   Please continue JanuMet but take ONLY 2 tablets [phone]) with breakfast  Take Metformin 1000 mg with dinner.  Start Glipizide XL 5 mg in am. If sugars are still higher than target, please let me know, we may need to increase the dose to 10 mg in am.  Please return in 1.5 months with your sugar log (after 06/09/2015).

## 2015-07-05 ENCOUNTER — Ambulatory Visit: Payer: BLUE CROSS/BLUE SHIELD | Admitting: Internal Medicine

## 2015-08-13 ENCOUNTER — Other Ambulatory Visit: Payer: Self-pay | Admitting: Internal Medicine

## 2015-09-01 ENCOUNTER — Other Ambulatory Visit: Payer: Self-pay | Admitting: Pulmonary Disease

## 2015-09-02 ENCOUNTER — Other Ambulatory Visit: Payer: Self-pay | Admitting: Pulmonary Disease

## 2015-09-02 ENCOUNTER — Other Ambulatory Visit: Payer: Self-pay | Admitting: Internal Medicine

## 2015-09-03 ENCOUNTER — Ambulatory Visit: Payer: BLUE CROSS/BLUE SHIELD | Admitting: Internal Medicine

## 2015-09-06 ENCOUNTER — Encounter: Payer: Self-pay | Admitting: Pulmonary Disease

## 2015-09-13 ENCOUNTER — Telehealth: Payer: Self-pay | Admitting: Pulmonary Disease

## 2015-09-13 NOTE — Telephone Encounter (Signed)
[facility] for Janumet 50-[id number] [employer] - [phone] Decision in 3-5 days

## 2015-09-15 NOTE — Telephone Encounter (Signed)
Your request has been approved  Details of this decision are provided on the physician outcome notice which has been faxed to the number on file.   Called [facility] and notified them that the request has been approved  Also called and made the pt aware  Nothing further needed

## 2015-10-04 ENCOUNTER — Other Ambulatory Visit: Payer: Self-pay | Admitting: Pulmonary Disease

## 2015-10-25 ENCOUNTER — Ambulatory Visit: Payer: BLUE CROSS/BLUE SHIELD | Admitting: Internal Medicine

## 2015-10-25 ENCOUNTER — Other Ambulatory Visit: Payer: Self-pay | Admitting: Pulmonary Disease

## 2015-11-01 ENCOUNTER — Ambulatory Visit: Payer: BLUE CROSS/BLUE SHIELD | Admitting: Pulmonary Disease

## 2015-11-03 ENCOUNTER — Other Ambulatory Visit: Payer: Self-pay | Admitting: Pulmonary Disease

## 2015-11-03 ENCOUNTER — Other Ambulatory Visit: Payer: Self-pay | Admitting: Internal Medicine

## 2015-11-12 ENCOUNTER — Telehealth: Payer: Self-pay | Admitting: Internal Medicine

## 2015-11-12 MED ORDER — METFORMIN HCL 1000 MG PO TABS: ORAL_TABLET | ORAL | Status: DC

## 2015-11-12 NOTE — Telephone Encounter (Signed)
Patient need a refill of metformin of send to [facility] on [address].

## 2015-12-14 ENCOUNTER — Ambulatory Visit: Payer: BLUE CROSS/BLUE SHIELD | Admitting: Internal Medicine

## 2016-02-10 ENCOUNTER — Encounter: Payer: Self-pay | Admitting: Internal Medicine

## 2016-02-10 ENCOUNTER — Other Ambulatory Visit (INDEPENDENT_AMBULATORY_CARE_PROVIDER_SITE_OTHER): Payer: BLUE CROSS/BLUE SHIELD | Admitting: *Deleted

## 2016-02-10 ENCOUNTER — Ambulatory Visit (INDEPENDENT_AMBULATORY_CARE_PROVIDER_SITE_OTHER): Payer: BLUE CROSS/BLUE SHIELD | Admitting: Internal Medicine

## 2016-02-10 VITALS — BP 114/68 | HR 73 | Temp 97.9°F | Resp 12 | Wt 183.6 lb

## 2016-02-10 DIAGNOSIS — E1165 Type 2 diabetes mellitus with hyperglycemia: Secondary | ICD-10-CM | POA: Diagnosis not present

## 2016-02-10 LAB — POCT GLYCOSYLATED HEMOGLOBIN (HGB A1C): Hemoglobin A1C: 8

## 2016-02-10 MED ORDER — GLIPIZIDE ER 5 MG PO TB24: 5.0000 mg | ORAL_TABLET | Freq: Every day | ORAL | Status: DC

## 2016-02-10 NOTE — Progress Notes (Signed)
Patient ID: OAKLAN PERSONS, male   DOB: Oct 04, 1950, 65 y.o.   MRN: [id number]  HPI: Victor Garrett is a 65 y.o.-year-old male, returning for f/u for DM2, dx 2008 - had Whipple procedure at [facility] in 2000 - removed 50% of the pancreas), non-insulin-dependent, uncontrolled, without complications. Last visit 9 mo ago.  He was dx'ed with candida gastritis in 11/2015 >> started Sucralfate.  Last hemoglobin A1c was: Lab Results  Component Value Date   HGBA1C 7.4 04/08/2015   HGBA1C 11.0* 08/11/2014   HGBA1C 7.4* 10/27/2013  Before last HbA1c, he has been eating a lot of fruit.  Pt was on a regimen of: - JanuMet 50-500 mg bid - Glimepiride 4 mg in am (increased in 07/2014) He had several episodes of pancreatitis after this up untill 2005-6.  He is now on: - Janumet 50-500 mg 2x a day - Metformin 1000 mg at dinnertime - Glipizide XL 5 mg in a.m.  Pt checks his sugars 2x a day and they are: - am: 130-160 >> 127-146 >> 111-141, 157 - 2h after b'fast: n/c >> 156-169, 175 >> n/c - before lunch: n/c >> 117-159, 169, 191 >> n/c - 2h after lunch: n/c >> 149-172, 200 >> 151-186 - before dinner: n/c >> 156-164 >> 135, 153 - 2h after dinner: n/c >> 134-187, 214 >> 141-161 - bedtime: n/c >> 130-161 - nighttime: n/c No lows. Lowest sugar was 117 >> 111; ? hypoglycemia awareness.  Highest sugar was 214 (eggs benedict) >> 186.  Glucometer: Accu Check [employer]  Pt's meals are: - Breakfast: bagel or oatmeal + fruit - Lunch: sandwich - Dinner: eats out every night - Snacks: He does not eat large meals, but eats small meals   - no CKD, last BUN/creatinine:  Lab Results  Component Value Date   BUN 19 09/23/2014   CREATININE 0.9 09/23/2014   he is not on ACE inhibitor's. - last set of lipids: Lab Results  Component Value Date   CHOL 169 10/27/2013   HDL 36.00* 10/27/2013   LDLCALC 113* 10/27/2013   TRIG 99.0 10/27/2013   CHOLHDL 5 10/27/2013   he is not on a statin. - UTD with eye exams   - no numbness and tingling in his feet.  He was suspected to have pancreatic cancer >> Whipple procedure in 2000 >> but biopsy returned negative for cancer, only pancreatitis. He had a perforated ulcer in 2004. He also has paroxysmal Afib.  ROS: Constitutional: no weight gain/loss, no fatigue, no subjective hyperthermia/hypothermia Eyes: no blurry vision, no xerophthalmia ENT: no sore throat, no nodules palpated in throat, no dysphagia/odynophagia, no hoarseness Cardiovascular: no CP/SOB/palpitations/leg swelling Respiratory: no cough/SOB Gastrointestinal: no N/V/D/C/acid reflux Musculoskeletal: no muscle/no joint aches Skin: no rashes Neurological: no tremors/numbness/tingling/dizziness  I reviewed pt's medications, allergies, PMH, social hx, family hx, and changes were documented in the history of present illness. Otherwise, unchanged from my initial visit note:  Past Medical History  Diagnosis Date  . Diabetes mellitus   . Sleep apnea   . Paroxysmal atrial fibrillation   . Mitral regurgitation   . Low HDL (under 40)   . Gastritis, acute   . Abdominal pain     recurrent  . Acute gastrojejunal ulcer with perforation but without obstruction   . Diverticula of colon   . Gallstones   . Irritable bowel syndrome   . Bile duct stricture     hx  . Pancreatitis     chronic  . Abnormal liver function  test     hx  . Elevated prostate specific antigen (PSA)   . Anxiety   . Depressed    Past Surgical History  Procedure Laterality Date  . Whipple procedure  2000    by Dr. Dossie Der at [facility] benign stricture in common bile duct, path showed fibrosis & nflammation  . Elap w/ suture of perf jejunal ulcer & drailage of lesser sac abscess  11/04    in [facility]  . F/u ercp  7/05    Dr. Harl Bowie at [facility] with sludge vs stones near junction of CBD & cystic duct, no stone in the GB   History   Social History  . Marital Status: Married    Spouse Name: N/A  . Children: yes    Occupational History  .  sales    Social History Main Topics  . Smoking status: Never Smoker   . Smokeless tobacco: No     Comment: nonsmoker  . Alcohol Use: No  . Drug Use: No   Social History Narrative   Exercises= walks some. No caffeine use.    Current Outpatient Prescriptions on File Prior to Visit  Medication Sig Dispense Refill  . ACCU-CHEK AVIVA PLUS test strip TEST BLOOD SUGAR ONCE DAILY 100 each 0  . ALPRAZolam (XANAX) 0.5 MG tablet Take 1 tablet (0.5 mg total) by mouth 3 (three) times daily as needed for anxiety. (Patient not taking: Reported on 04/08/2015) 90 tablet 5  . Blood Glucose Monitoring Suppl (ACCU-CHEK AVIVA PLUS) W/DEVICE KIT TEST BLOOD SUGAR ONCE DAILY 1 kit 0  . cholecalciferol (VITAMIN D) 1000 UNITS tablet Take 6 tablets by mouth daily 90 tablet 5  . CREON 24000 UNITS CPEP TAKE 2 CAPSULES BY MOUTH EVERY DAY WITH A MEAL 60 capsule 0  . glimepiride (AMARYL) 2 MG tablet TAKE 1 TABLET BY MOUTH EVERY DAY BEFORE BREAKFAST. 90 tablet 0  . glipiZIDE (GLUCOTROL XL) 5 MG 24 hr tablet Take 1 tablet (5 mg total) by mouth daily with breakfast. **PT NEEDS FOLLOW UP APPT WITH DR Margaux Engen** 60 tablet 0  . HYDROcodone-acetaminophen (NORCO/VICODIN) 5-325 MG per tablet Take 1 tablet by mouth 3 (three) times daily as needed for moderate pain. (Patient not taking: Reported on 05/24/2015) 50 tablet 0  . metFORMIN (GLUCOPHAGE) 1000 MG tablet TAKE 1 TABLET(1000 MG) BY MOUTH DAILY WITH DINNER 90 tablet 0  . pantoprazole (PROTONIX) 40 MG tablet TAKE 1 TABLET ONCE DAILY. NEEDS APPOINTMENT. 30 tablet 0  . sitaGLIPtin-metformin (JANUMET) 50-500 MG per tablet Take 2 tablets with breakfast 60 tablet 3   No current facility-administered medications on file prior to visit.   Allergies  Allergen Reactions  . Penicillins     REACTION: pt states "lethargy"   Family History  Problem Relation Age of Onset  . Brain cancer Mother   . Asthma Mother   . Emphysema Mother   . Coronary artery  disease Father     previous, began in his 98s   PE: BP 114/68 mmHg  Pulse 73  Temp(Src) 97.9 F (36.6 C) (Oral)  Resp 12  Wt 183 lb 9.6 oz (83.28 kg)  SpO2 97% Body mass index is 25.96 kg/(m^2). Wt Readings from Last 3 Encounters:  02/10/16 183 lb 9.6 oz (83.28 kg)  05/24/15 185 lb (83.915 kg)  04/08/15 190 lb (86.183 kg)   Constitutional: Normal weight, in NAD Eyes: PERRLA, EOMI, no exophthalmos ENT: moist mucous membranes, no thyromegaly, no cervical lymphadenopathy Cardiovascular: RRR, No MRG Respiratory: CTA B Gastrointestinal:  abdomen soft, NT, ND, BS+ Musculoskeletal: no deformities, strength intact in all 4 Skin: moist, warm, no rashes, + vitiligo on hands Neurological: no tremor with outstretched hands, DTR normal in all 4  ASSESSMENT: 1. DM2, non-insulin-dependent, uncontrolled, without complications  PLAN:  1. Patient with long-standing diabetes, on oral antidiabetic regimen, returning after a long absence. Sugars a little higher as he had more fried foods lately.  - we again discussed about the fact that he is on Januvia and this is not an optimal medication for him since he has a history of pancreatitis. No pancreatitis episodes after he started Januvia. She would like to continue this medicine for now.  - I suggested to:  Patient Instructions  Please continue: - Janumet 50-500 mg 2x a day - Metformin 1000 mg at dinnertime - Glipizide XL 5 mg in a.m.  Try to work on your diet >> more fruit and veggies.  Try to increase the Glipizide XL to 10 mg in am if sugars not improving after dietary changes.  Please return in 3 months with your sugar log.   - continue checking sugars at different times of the day - check 1-2x a day, rotating checks - advised for yearly eye exams >> UTD (Dr. Prudencio Burly) - checked HbA1c is 8% today (higher) - Return to clinic in 3 mo with sugar log

## 2016-02-10 NOTE — Patient Instructions (Signed)
Please continue: - Janumet 50-500 mg 2x a day - Metformin 1000 mg at dinnertime - Glipizide XL 5 mg in a.m.  Try to work on your diet >> more fruit and veggies.  Try to increase the Glipizide XL to 10 mg in am if sugars not improving after dietary changes.  Please return in 3 months with your sugar log.

## 2016-02-11 ENCOUNTER — Ambulatory Visit: Payer: BLUE CROSS/BLUE SHIELD | Admitting: Internal Medicine

## 2016-02-22 ENCOUNTER — Other Ambulatory Visit: Payer: Self-pay | Admitting: Pulmonary Disease

## 2016-04-07 ENCOUNTER — Other Ambulatory Visit: Payer: Self-pay | Admitting: Pulmonary Disease

## 2016-04-08 ENCOUNTER — Other Ambulatory Visit: Payer: Self-pay | Admitting: Internal Medicine

## 2016-05-11 ENCOUNTER — Ambulatory Visit (HOSPITAL_COMMUNITY)
Admit: 2016-05-11 | Discharge: 2016-05-11 | Disposition: A | Discharge status: Home / Self Care | Payer: BLUE CROSS/BLUE SHIELD | Attending: Internal Medicine | Admitting: Internal Medicine

## 2016-05-11 ENCOUNTER — Observation Stay (HOSPITAL_COMMUNITY): Payer: BLUE CROSS/BLUE SHIELD

## 2016-05-11 ENCOUNTER — Encounter (HOSPITAL_COMMUNITY): Payer: Self-pay | Admitting: Emergency Medicine

## 2016-05-11 ENCOUNTER — Emergency Department (HOSPITAL_COMMUNITY): Payer: BLUE CROSS/BLUE SHIELD

## 2016-05-11 ENCOUNTER — Ambulatory Visit (HOSPITAL_BASED_OUTPATIENT_CLINIC_OR_DEPARTMENT_OTHER)
Admit: 2016-05-11 | Discharge: 2016-05-11 | Disposition: A | Discharge status: Home / Self Care | Payer: BLUE CROSS/BLUE SHIELD | Attending: Internal Medicine | Admitting: Internal Medicine

## 2016-05-11 ENCOUNTER — Observation Stay (HOSPITAL_COMMUNITY)
Admission: EM | Admit: 2016-05-11 | Discharge: 2016-05-12 | Disposition: A | Discharge status: Home / Self Care | Payer: BLUE CROSS/BLUE SHIELD | Attending: Internal Medicine | Admitting: Internal Medicine

## 2016-05-11 DIAGNOSIS — I340 Nonrheumatic mitral (valve) insufficiency: Secondary | ICD-10-CM | POA: Insufficient documentation

## 2016-05-11 DIAGNOSIS — E1165 Type 2 diabetes mellitus with hyperglycemia: Secondary | ICD-10-CM | POA: Diagnosis present

## 2016-05-11 DIAGNOSIS — E119 Type 2 diabetes mellitus without complications: Secondary | ICD-10-CM | POA: Insufficient documentation

## 2016-05-11 DIAGNOSIS — R079 Chest pain, unspecified: Secondary | ICD-10-CM

## 2016-05-11 DIAGNOSIS — G4733 Obstructive sleep apnea (adult) (pediatric): Secondary | ICD-10-CM | POA: Insufficient documentation

## 2016-05-11 DIAGNOSIS — Z7984 Long term (current) use of oral hypoglycemic drugs: Secondary | ICD-10-CM | POA: Insufficient documentation

## 2016-05-11 DIAGNOSIS — Z79899 Other long term (current) drug therapy: Secondary | ICD-10-CM | POA: Diagnosis not present

## 2016-05-11 DIAGNOSIS — K219 Gastro-esophageal reflux disease without esophagitis: Secondary | ICD-10-CM | POA: Diagnosis not present

## 2016-05-11 DIAGNOSIS — Z8249 Family history of ischemic heart disease and other diseases of the circulatory system: Secondary | ICD-10-CM | POA: Diagnosis not present

## 2016-05-11 DIAGNOSIS — Z23 Encounter for immunization: Secondary | ICD-10-CM | POA: Insufficient documentation

## 2016-05-11 DIAGNOSIS — I480 Paroxysmal atrial fibrillation: Secondary | ICD-10-CM | POA: Insufficient documentation

## 2016-05-11 LAB — ECHOCARDIOGRAM COMPLETE
Height: 71.5 in
Weight: 2979.2 oz

## 2016-05-11 LAB — CBC
HCT: 39.2 % (ref 39.0–52.0)
Hemoglobin: 13 g/dL (ref 13.0–17.0)
MCH: 28.1 pg (ref 26.0–34.0)
MCHC: 33.2 g/dL (ref 30.0–36.0)
MCV: 84.7 fL (ref 78.0–100.0)
Platelets: 167 10*3/uL (ref 150–400)
RBC: 4.63 MIL/uL (ref 4.22–5.81)
RDW: 13.8 % (ref 11.5–15.5)
WBC: 7.4 10*3/uL (ref 4.0–10.5)

## 2016-05-11 LAB — RAPID URINE DRUG SCREEN, HOSP PERFORMED
Amphetamines: NOT DETECTED
Barbiturates: NOT DETECTED
Benzodiazepines: NOT DETECTED
Cocaine: NOT DETECTED
Opiates: NOT DETECTED
Tetrahydrocannabinol: NOT DETECTED

## 2016-05-11 LAB — TROPONIN I
Troponin I: <0.03 ng/mL (ref <0.03)
Troponin I: <0.03 ng/mL (ref <0.03)
Troponin I: <0.03 ng/mL (ref <0.03)

## 2016-05-11 LAB — BASIC METABOLIC PANEL
Anion gap: 7 (ref 5–15)
BUN: 12 mg/dL (ref 6–20)
CO2: 25 mmol/L (ref 22–32)
Calcium: 8.7 mg/dL — ABNORMAL LOW (ref 8.9–10.3)
Chloride: 105 mmol/L (ref 101–111)
Creatinine, Ser: 0.92 mg/dL (ref 0.61–1.24)
GFR calc Af Amer: >60 mL/min (ref >60)
GFR calc non Af Amer: >60 mL/min (ref >60)
Glucose, Bld: 251 mg/dL — ABNORMAL HIGH (ref 65–99)
Potassium: 4.2 mmol/L (ref 3.5–5.1)
Sodium: 137 mmol/L (ref 135–145)

## 2016-05-11 LAB — LIPID PANEL
Cholesterol: 125 mg/dL (ref 0–200)
HDL: 27 mg/dL — ABNORMAL LOW (ref >40)
LDL Cholesterol: 62 mg/dL (ref 0–99)
Total CHOL/HDL Ratio: 4.6 RATIO
Triglycerides: 180 mg/dL — ABNORMAL HIGH (ref <150)
VLDL: 36 mg/dL (ref 0–40)

## 2016-05-11 LAB — GLUCOSE, CAPILLARY
Glucose-Capillary: 149 mg/dL — ABNORMAL HIGH (ref 65–99)
Glucose-Capillary: 110 mg/dL — ABNORMAL HIGH (ref 65–99)
Glucose-Capillary: 199 mg/dL — ABNORMAL HIGH (ref 65–99)
Glucose-Capillary: 256 mg/dL — ABNORMAL HIGH (ref 65–99)
Glucose-Capillary: 257 mg/dL — ABNORMAL HIGH (ref 65–99)

## 2016-05-11 LAB — HEPATIC FUNCTION PANEL
ALT: 14 U/L — ABNORMAL LOW (ref 17–63)
AST: 19 U/L (ref 15–41)
Albumin: 4.1 g/dL (ref 3.5–5.0)
Alkaline Phosphatase: 50 U/L (ref 38–126)
Bilirubin, Direct: 0.1 mg/dL (ref 0.1–0.5)
Indirect Bilirubin: 0.2 mg/dL — ABNORMAL LOW (ref 0.3–0.9)
Total Bilirubin: 0.3 mg/dL (ref 0.3–1.2)
Total Protein: 6.5 g/dL (ref 6.5–8.1)

## 2016-05-11 LAB — NM MYOCAR MULTI W/SPECT W/WALL MOTION / EF
Peak HR: 89 {beats}/min
Rest HR: 60 {beats}/min

## 2016-05-11 LAB — LIPASE, BLOOD: Lipase: 15 U/L (ref 11–51)

## 2016-05-11 LAB — BRAIN NATRIURETIC PEPTIDE: B Natriuretic Peptide: 17.2 pg/mL (ref 0.0–100.0)

## 2016-05-11 LAB — I-STAT TROPONIN, ED: Troponin i, poc: 0 ng/mL (ref 0.00–0.08)

## 2016-05-11 LAB — PROTIME-INR
INR: 1.01
Prothrombin Time: 13.3 seconds (ref 11.4–15.2)

## 2016-05-11 MED ORDER — HEPARIN SODIUM (PORCINE) 5000 UNIT/ML IJ SOLN
5000.0000 [IU] | Freq: Three times a day (TID) | INTRAMUSCULAR | Status: DC
  Administered 2016-05-11 – 2016-05-12 (×3): 5000 [IU] via SUBCUTANEOUS
  Filled 2016-05-11 (×3): qty 1

## 2016-05-11 MED ORDER — ZOLPIDEM TARTRATE 5 MG PO TABS: 5.0000 mg | ORAL_TABLET | Freq: Every evening | ORAL | Status: DC | PRN

## 2016-05-11 MED ORDER — NITROGLYCERIN 0.4 MG SL SUBL
0.4000 mg | SUBLINGUAL_TABLET | SUBLINGUAL | Status: DC | PRN
  Administered 2016-05-11: 0.4 mg via SUBLINGUAL
  Filled 2016-05-11: qty 1

## 2016-05-11 MED ORDER — PANTOPRAZOLE SODIUM 40 MG PO TBEC
40.0000 mg | DELAYED_RELEASE_TABLET | Freq: Every day | ORAL | Status: DC
  Administered 2016-05-11 – 2016-05-12 (×2): 40 mg via ORAL
  Filled 2016-05-11 (×2): qty 1

## 2016-05-11 MED ORDER — ALPRAZOLAM 0.25 MG PO TABS: 0.2500 mg | ORAL_TABLET | Freq: Two times a day (BID) | ORAL | Status: DC | PRN

## 2016-05-11 MED ORDER — VITAMIN D3 25 MCG (1000 UNIT) PO TABS
6000.0000 [IU] | ORAL_TABLET | Freq: Every day | ORAL | Status: DC
  Administered 2016-05-11 – 2016-05-12 (×2): 6000 [IU] via ORAL
  Filled 2016-05-11 (×2): qty 6

## 2016-05-11 MED ORDER — ONDANSETRON HCL 4 MG/2ML IJ SOLN: 4.0000 mg | Freq: Four times a day (QID) | INTRAMUSCULAR | Status: DC | PRN

## 2016-05-11 MED ORDER — REGADENOSON 0.4 MG/5ML IV SOLN
0.4000 mg | Freq: Once | INTRAVENOUS | Status: AC
  Administered 2016-05-11: 0.4 mg via INTRAVENOUS

## 2016-05-11 MED ORDER — TECHNETIUM TC 99M TETROFOSMIN IV KIT
30.0000 | PACK | Freq: Once | INTRAVENOUS | Status: AC | PRN
  Administered 2016-05-11: 30 via INTRAVENOUS

## 2016-05-11 MED ORDER — ASPIRIN 81 MG PO CHEW
81.0000 mg | CHEWABLE_TABLET | Freq: Once | ORAL | Status: AC
  Administered 2016-05-11: 81 mg via ORAL
  Filled 2016-05-11: qty 1

## 2016-05-11 MED ORDER — INSULIN ASPART 100 UNIT/ML ~~LOC~~ SOLN
0.0000 [IU] | Freq: Every day | SUBCUTANEOUS | Status: DC
  Administered 2016-05-11: 3 [IU] via SUBCUTANEOUS

## 2016-05-11 MED ORDER — PANCRELIPASE (LIP-PROT-AMYL) 12000-38000 UNITS PO CPEP
24000.0000 [IU] | ORAL_CAPSULE | Freq: Three times a day (TID) | ORAL | Status: DC
Start: 2016-05-11 — End: 2016-05-12
  Administered 2016-05-11 – 2016-05-12 (×3): 24000 [IU] via ORAL
  Filled 2016-05-11 (×5): qty 2

## 2016-05-11 MED ORDER — PNEUMOCOCCAL VAC POLYVALENT 25 MCG/0.5ML IJ INJ
0.5000 mL | INJECTION | INTRAMUSCULAR | Status: AC
  Administered 2016-05-12: 0.5 mL via INTRAMUSCULAR
  Filled 2016-05-11 (×3): qty 0.5

## 2016-05-11 MED ORDER — REGADENOSON 0.4 MG/5ML IV SOLN
INTRAVENOUS | Status: AC
  Administered 2016-05-11: 0.4 mg via INTRAVENOUS
  Filled 2016-05-11: qty 5

## 2016-05-11 MED ORDER — MORPHINE SULFATE (PF) 2 MG/ML IV SOLN: 2.0000 mg | INTRAVENOUS | Status: DC | PRN

## 2016-05-11 MED ORDER — TECHNETIUM TC 99M TETROFOSMIN IV KIT
10.0000 | PACK | Freq: Once | INTRAVENOUS | Status: AC | PRN
  Administered 2016-05-11: 10 via INTRAVENOUS

## 2016-05-11 MED ORDER — ASPIRIN 81 MG PO CHEW
324.0000 mg | CHEWABLE_TABLET | Freq: Once | ORAL | Status: AC
  Administered 2016-05-11: 324 mg via ORAL
  Filled 2016-05-11: qty 4

## 2016-05-11 MED ORDER — ACETAMINOPHEN 325 MG PO TABS: 650.0000 mg | ORAL_TABLET | ORAL | Status: DC | PRN

## 2016-05-11 MED ORDER — SUCRALFATE 1 GM/10ML PO SUSP
1.0000 g | Freq: Three times a day (TID) | ORAL | Status: DC
  Administered 2016-05-11 – 2016-05-12 (×4): 1 g via ORAL
  Filled 2016-05-11 (×4): qty 10

## 2016-05-11 MED ORDER — INSULIN ASPART 100 UNIT/ML ~~LOC~~ SOLN
0.0000 [IU] | Freq: Three times a day (TID) | SUBCUTANEOUS | Status: DC
  Administered 2016-05-11: 2 [IU] via SUBCUTANEOUS
  Administered 2016-05-11: 1 [IU] via SUBCUTANEOUS
  Administered 2016-05-12: 2 [IU] via SUBCUTANEOUS

## 2016-05-11 NOTE — Progress Notes (Signed)
Progress Note    Victor Garrett  RUE:[id number] DOB: 11-Apr-1951  DOA: 05/11/2016 PCP: Michele Mcalpine, MD    Brief Narrative:   Victor Garrett is an 65 y.o. male with a PMH of DM-2, pancreatitis s/p Whipple procedure who was admitted 05/11/16 for evaluation of non-exertional chest pain.  Assessment/Plan:   Principal Problem:   Chest pain Has a +FH of early MI, DM.  Stress test/Echo done.  Awaiting results. Troponins negative x 2.  LDL 62.    Active Problems:   Type 2 diabetes mellitus with hyperglycemia (HCC Currently being managed by insulin sensitive SSI. CBGs 110-199.     GERD (gastroesophageal reflux disease) Continue PPI.   Family Communication/Anticipated D/C date and plan/Code Status   DVT prophylaxis: Lovenox ordered. Code Status: Full Code.  Family Communication: No family at the bedside. Disposition Plan: Home tomorrow if stress test negative.   Medical Consultants:    None.   Procedures:    None  Anti-Infectives:     Subjective:    Victor Garrett has not had any further chest pain.  No dyspnea.    Objective:    Vitals:   05/11/16 0244 05/11/16 0339 05/11/16 0634 05/11/16 1539  BP: 93/77 101/72 105/71 103/80  Pulse: 61 70 94 75  Resp: Temp:  98 F (36.7 C) 97.9 F (36.6 C) 98.4 F (36.9 C)  TempSrc:  Oral Oral Oral  SpO2: 97% 97% 98% 99%  Weight:  84.5 kg (186 lb 3.2 oz)    Height:  5' 11.5" (1.816 m)      Intake/Output Summary (Last 24 hours) at 05/11/16 1717 Last data filed at 05/11/16 0819  Gross per 24 hour  Intake                0 ml  Output              500 ml  Net             -500 ml   Filed Weights   05/11/16 0031 05/11/16 0339  Weight: 83.9 kg (185 lb) 84.5 kg (186 lb 3.2 oz)    Exam: General exam: Appears calm and comfortable.  Respiratory system: Clear to auscultation. Respiratory effort normal. Cardiovascular system: S1 & S2 heard, RRR. No JVD,  rubs, gallops or clicks. No  murmurs. Gastrointestinal system: Abdomen is nondistended, soft and nontender. No organomegaly or masses felt. Normal bowel sounds heard. Central nervous system: Alert and oriented. No focal neurological deficits. Extremities: No clubbing,  or cyanosis. No edema. Skin: No rashes, lesions or ulcers. Psychiatry: Judgement and insight appear normal. Mood & affect appropriate.   Data Reviewed:   I have personally reviewed following labs and imaging studies:  Labs: Basic Metabolic Panel:  Recent Labs Lab 05/11/16 0038  NA 137  K 4.2  CL 105  CO2 25  GLUCOSE 251*  BUN 12  CREATININE 0.92  CALCIUM 8.7*   GFR Estimated Creatinine Clearance: 86.6 mL/min (by C-G formula based on SCr of 0.92 mg/dL). Liver Function Tests:  Recent Labs Lab 05/11/16 0038  AST 19  ALT 14*  ALKPHOS 50  BILITOT 0.3  PROT 6.5  ALBUMIN 4.1    Recent Labs Lab 05/11/16 0038  LIPASE 15   No results for input(s): AMMONIA in the last 168 hours. Coagulation profile  Recent Labs Lab 05/11/16 0456  INR 1.01    CBC:  Recent Labs Lab 05/11/16 0038  WBC 7.4  HGB 13.0  HCT 39.2  MCV 84.7  PLT 167   Cardiac Enzymes:  Recent Labs Lab 05/11/16 0456 05/11/16 0928  TROPONINI <0.03 <0.03   BNP (last 3 results) No results for input(s): PROBNP in the last 8760 hours. CBG:  Recent Labs Lab 05/11/16 0730 05/11/16 1143 05/11/16 1640  GLUCAP 149* 110* 199*   D-Dimer: No results for input(s): DDIMER in the last 72 hours. Hgb A1c: No results for input(s): HGBA1C in the last 72 hours. Lipid Profile:  Recent Labs  05/11/16 0456  CHOL 125  HDL 27*  LDLCALC 62  TRIG 191180*  CHOLHDL 4.6   Thyroid function studies: No results for input(s): TSH, T4TOTAL, T3FREE, THYROIDAB in the last 72 hours.  Invalid input(s): FREET3 Anemia work up: No results for input(s): VITAMINB12, FOLATE, FERRITIN, TIBC, IRON, RETICCTPCT in the last 72 hours. Sepsis Labs:  Recent Labs Lab 05/11/16 0038   WBC 7.4    Microbiology No results found for this or any previous visit (from the past 240 hour(s)).  Radiology: Dg Chest 2 View  Result Date: 05/11/2016 CLINICAL DATA:  Acute chest pain today. EXAM: CHEST  2 VIEW COMPARISON:  06/27/2013 FINDINGS: The cardiomediastinal silhouette is unremarkable. There is no evidence of focal airspace disease, pulmonary edema, suspicious pulmonary nodule/mass, pleural effusion, or pneumothorax. No acute bony abnormalities are identified. IMPRESSION: No active cardiopulmonary disease. Electronically Signed   By: Harmon PierJeffrey  Hu M.D.   On: 05/11/2016 01:47    Medications:   . cholecalciferol  6,000 Units Oral Daily  . heparin  5,000 Units Subcutaneous Q8H  . insulin aspart  0-5 Units Subcutaneous QHS  . insulin aspart  0-9 Units Subcutaneous TID WC  . lipase/protease/amylase  24,000 Units Oral TID WC  . pantoprazole  40 mg Oral Daily  . [START ON 05/12/2016] pneumococcal 23 valent vaccine  0.5 mL Intramuscular Tomorrow-1000  . sucralfate  1 g Oral TID   Continuous Infusions:   Time spent: 25 minutes   LOS: 0 days   RAMA,CHRISTINA  [facility] Pager [phone]. If unable to reach me by pager, please call my cell phone at [phone].  *Please refer to [email or web address], password [email or web address] to get updated schedule on who will round on this patient, as hospitalists switch teams weekly. If 7PM-7AM, please contact night-coverage at [email or web address], password [email or web address] for any overnight needs.  05/11/2016, 5:17 PM

## 2016-05-11 NOTE — Progress Notes (Signed)
  Echocardiogram 2D Echocardiogram has been performed.  Marisue Humblelexis N Harla Mensch 05/11/2016, 4:41 PM

## 2016-05-11 NOTE — ED Triage Notes (Signed)
Patient presents to ED via POV.  Pt complains of chest pain in center of chest that started approximately one hour ago.  Pt has had some abdominal discomfort today as well as on occassions but today's pain has been different.  He has previous GI issues including a Whipple procedure.  [facility]

## 2016-05-11 NOTE — H&P (Signed)
History and Physical    Victor Garrett QBH:[id number] DOB: [birth year] DOA: 05/11/2016  Referring MD/NP/PA:   PCP: Noralee Space, MD   Patient coming from:  The patient is coming from home.  At baseline, pt is independent for most of ADL.   Chief Complaint: chest pain  HPI: Victor Garrett is a 65 y.o. male with medical history significant of diabetes mellitus, OSA not using CPAP, pancreatitis, IBS, perforated gastrojejunostomy ulcer, s/p of whipple procedure, transient PAF (induced by surgery and resolved per pt), who presents with chest pain.  Patient reports that he started having chest pain at the 10:30 PM. It is located in substernal area, constant, 7 out of 10 severity, pressure-like, radiating to the upper back. Patient does not have fever, chills, cough, SOB. No tenderness over calf areas. She has burping, but no nausea, vomiting, abdominal pain or diarrhea. Denies symptoms of UTI or unilateral weakness.  ED Course: pt was found to have negative troponin, lipase 15, WBC 7.4, electrolytes and renal function okay, temperature normal, no tachycardia, no tachypnea. Negative chest x-ray. Patient has symmetric blood pressure in both arms. I personally measured his blood pressure in both arms (left arm Bp 94/77, right arm 94/65). Patient is placed on telemetry bed for observation.  Review of Systems:   General: no fevers, chills, no changes in body weight,  has fatigue HEENT: no blurry vision, hearing changes or sore throat Pulm: no dyspnea, coughing, wheezing CV: has chest pain, no palpitations Abd: no nausea, vomiting, abdominal pain, diarrhea, constipation GU: no dysuria, burning on urination, increased urinary frequency, hematuria  Ext: has mild ankle edema Neuro: no unilateral weakness, numbness, or tingling, no vision change or hearing loss Skin: no rash MSK: No muscle spasm, no deformity, no limitation of range of movement in spin Heme: No easy bruising.  Travel history: No  recent long distant travel.  Allergy:  Allergies  Allergen Reactions  . Penicillins     REACTION: pt states "lethargy"    Past Medical History:  Diagnosis Date  . Abdominal pain    recurrent  . Abnormal liver function test    hx  . Acute gastrojejunal ulcer with perforation but without obstruction ([place])   . Anxiety   . Bile duct stricture    hx  . Depressed   . Diabetes mellitus   . Diverticula of colon   . Elevated prostate specific antigen (PSA)   . Gallstones   . Gastritis, acute   . Irritable bowel syndrome   . Low HDL (under 40)   . Mitral regurgitation   . Pancreatitis    chronic  . Paroxysmal atrial fibrillation (HCC)   . Sleep apnea     Past Surgical History:  Procedure Laterality Date  . elap w/ suture of perf jejunal ulcer & drailage of lesser sac abscess  11/04   in [place]  . f/u ERCP  7/05   Dr. Harl Bowie at [facility] with sludge vs stones near junction of CBD & cystic duct, no stone in the [place]   by Dr. Dossie Der at [facility].- benign stricture in common bile duct, path showed fibrosis & nflammation    Social History:  reports that he has never smoked. He has never used smokeless tobacco. He reports that he does not drink alcohol or use drugs.  Family History:  Family History  Problem Relation Age of Onset  . Brain cancer Mother   . Asthma Mother   . Emphysema  Mother   . Coronary artery disease Father     previous, began in his 22s     Prior to Admission medications   Medication Sig Start Date End Date Taking? Authorizing Provider  cholecalciferol (VITAMIN D) 1000 UNITS tablet Take 6 tablets by mouth daily 01/16/12  Yes Noralee Space, MD  CREON 24000 UNITS CPEP TAKE 2 CAPSULES BY MOUTH EVERY DAY WITH A MEAL Patient taking differently: TAKE 3 CAPSULES BY MOUTH EVERY DAY WITH each  MEAL 09/01/15  Yes Noralee Space, MD  glipiZIDE (GLUCOTROL XL) 5 MG 24 hr tablet Take 1 tablet (5 mg total) by mouth daily with breakfast. 02/10/16  Yes  Philemon Kingdom, MD  metFORMIN (GLUCOPHAGE) 1000 MG tablet TAKE 1 TABLET(1000 MG) BY MOUTH DAILY WITH DINNER 11/12/15  Yes Philemon Kingdom, MD  pantoprazole (PROTONIX) 40 MG tablet TAKE 1 TABLET ONCE DAILY. NEEDS APPOINTMENT. 11/04/15  Yes Noralee Space, MD  sitaGLIPtin-metformin (JANUMET) 50-500 MG per tablet Take 2 tablets with breakfast 05/24/15  Yes Philemon Kingdom, MD  sucralfate (CARAFATE) 1 GM/10ML suspension Take 1 g by mouth 3 (three) times daily.  08/10/15 08/09/16 Yes Historical Provider, MD  [place] test strip TEST BLOOD SUGAR ONCE DAILY 09/01/15   Noralee Space, MD  Blood Glucose Monitoring Suppl (ACCU-CHEK AVIVA PLUS) W/DEVICE KIT TEST BLOOD SUGAR ONCE DAILY 08/13/14   Noralee Space, MD  glipiZIDE (GLUCOTROL XL) 5 MG 24 hr tablet TAKE 1 TABLET(5 MG) BY MOUTH DAILY WITH BREAKFAST. NEED FOLLOW UP APPOINTMENT WITH Erma Pinto** Patient not taking: Reported on 05/11/2016 04/10/16   Philemon Kingdom, MD    Physical Exam: Vitals:   05/11/16 0215 05/11/16 0230 05/11/16 0244 05/11/16 0339  BP: 1'08/69 96/67 93/77 '$ 101/72  Pulse:   61 70  Resp: '18 18 12 16  '$ Temp:    98 F (36.7 C)  TempSrc:    Oral  SpO2:   97% 97%  Weight:    84.5 kg (186 lb 3.2 oz)  Height:    5' 11.5" (1.816 m)   General: Not in acute distress HEENT:       Eyes: PERRL, EOMI, no scleral icterus.       ENT: No discharge from the ears and nose, no pharynx injection, no tonsillar enlargement.        Neck: No JVD, no bruit, no mass felt. Heme: No neck lymph node enlargement. Cardiac: S1/S2, RRR, No murmurs, No gallops or rubs. Pulm: No rales, wheezing, rhonchi or rubs. Abd: Soft, nondistended, nontender, no rebound pain, no organomegaly, BS present. GU: No hematuria Ext: has trace ankle leg edema bilaterally. 2+DP/PT pulse bilaterally. Musculoskeletal: No joint deformities, No joint redness or warmth, no limitation of ROM in spin. Skin: No rashes.  Neuro: Alert, oriented X3, cranial nerves II-XII  grossly intact, moves all extremities normally.  Psych: Patient is not psychotic, no suicidal or hemocidal ideation.  Labs on Admission: I have personally reviewed following labs and imaging studies  CBC:  Recent Labs Lab 05/11/16 0038  WBC 7.4  HGB 13.0  HCT 39.2  MCV 84.7  PLT 818   Basic Metabolic Panel:  Recent Labs Lab 05/11/16 0038  NA 137  K 4.2  CL 105  CO2 25  GLUCOSE 251*  BUN 12  CREATININE 0.92  CALCIUM 8.7*   GFR: Estimated Creatinine Clearance: 86.6 mL/min (by C-G formula based on SCr of 0.92 mg/dL). Liver Function Tests:  Recent Labs Lab 05/11/16 0038  AST 19  ALT 14*  ALKPHOS 50  BILITOT 0.3  PROT 6.5  ALBUMIN 4.1    Recent Labs Lab 05/11/16 0038  LIPASE 15   No results for input(s): AMMONIA in the last 168 hours. Coagulation Profile: No results for input(s): INR, PROTIME in the last 168 hours. Cardiac Enzymes: No results for input(s): CKTOTAL, CKMB, CKMBINDEX, TROPONINI in the last 168 hours. BNP (last 3 results) No results for input(s): PROBNP in the last 8760 hours. HbA1C: No results for input(s): HGBA1C in the last 72 hours. CBG: No results for input(s): GLUCAP in the last 168 hours. Lipid Profile: No results for input(s): CHOL, HDL, LDLCALC, TRIG, CHOLHDL, LDLDIRECT in the last 72 hours. Thyroid Function Tests: No results for input(s): TSH, T4TOTAL, FREET4, T3FREE, THYROIDAB in the last 72 hours. Anemia Panel: No results for input(s): VITAMINB12, FOLATE, FERRITIN, TIBC, IRON, RETICCTPCT in the last 72 hours. Urine analysis:    Component Value Date/Time   COLORURINE ORANGE BIOCHEMICALS MAY BE AFFECTED BY COLOR (A) 02/07/2011 1050   APPEARANCEUR CLEAR 02/07/2011 1050   LABSPEC 1.030 02/07/2011 1050   PHURINE 5.5 02/07/2011 1050   GLUCOSEU 250 (A) 02/07/2011 1050   GLUCOSEU 100 (?) 03/03/2009 1146   HGBUR NEGATIVE 02/07/2011 1050   BILIRUBINUR SMALL (A) 02/07/2011 1050   KETONESUR TRACE (A) 02/07/2011 1050   PROTEINUR  NEGATIVE 02/07/2011 1050   UROBILINOGEN 1.0 02/07/2011 1050   NITRITE NEGATIVE 02/07/2011 1050   LEUKOCYTESUR  02/07/2011 1050    NEGATIVE MICROSCOPIC NOT DONE ON URINES WITH NEGATIVE PROTEIN, BLOOD, LEUKOCYTES, NITRITE, OR GLUCOSE <1000 mg/dL.   Sepsis Labs: '@LABRCNTIP'$ (procalcitonin:4,lacticidven:4) )No results found for this or any previous visit (from the past 240 hour(s)).   Radiological Exams on Admission: Dg Chest 2 View  Result Date: 05/11/2016 CLINICAL DATA:  Acute chest pain today. EXAM: CHEST  2 VIEW COMPARISON:  06/27/2013 FINDINGS: The cardiomediastinal silhouette is unremarkable. There is no evidence of focal airspace disease, pulmonary edema, suspicious pulmonary nodule/mass, pleural effusion, or pneumothorax. No acute bony abnormalities are identified. IMPRESSION: No active cardiopulmonary disease. Electronically Signed   By: Margarette Canada M.D.   On: 05/11/2016 01:47     EKG: Independently reviewed. Sinus rhythm, QTC 422, low-voltage, no ischemic change.  Assessment/Plan Principal Problem:   Chest pain Active Problems:   Type 2 diabetes mellitus with hyperglycemia (HCC)   GERD (gastroesophageal reflux disease)   Chest pain: Etiology is not clear. Chest x-ray is negative. Initial troponin negative. EKG has a low voltage, but no ischemic change. Given patient history of diabetes and old age, will do chest pain rule out.  - will place on Tele bed for obs - cycle CE q6 x3 and repeat her EKG in the am  - prn Nitroglycerin, Morphine - ED give 324 mg of aspirin-->will change to low dose ASA 81 mg daily given hx of perforated gastrojejunal ulcer - Risk factor stratification: will check FLP, UDS and A1C  - check BNP - 2d echo - please call Card in AM  DM-II: Last A1c 8.0 on 02/10/16, poorly controled. Patient is taking metformin, glipizide, Janumet at home -SSI -Check A1c  GERD: -Protonix  DVT ppx: SQ Heparin (if pt develops severe chest pain or significantly elevated  trop, will be easier to switch to IV heparin or stop heparin for procedure than using Lovenox).  Code Status: Full code Family Communication: None at bed side.  Disposition Plan:  Anticipate discharge back to previous home environment Consults called:  none Admission status: Obs / tele  Date of Service  05/11/2016    Ivor Costa [facility] Pager [phone]  If 7PM-7AM, please contact night-coverage [email or web address] Password [email or web address] 05/11/2016, 5:41 AM

## 2016-05-11 NOTE — Progress Notes (Signed)
Patient had [place] sandwich about 350AM before the NPO order was entered, Dr. Clyde LundborgNiu notified.

## 2016-05-11 NOTE — ED Provider Notes (Signed)
[facility] DEPT Provider Note   CSN: [id number] Arrival date & time: 05/11/16  0025  First Provider Contact:   First MD Initiated Contact with Patient 05/11/16 0122     By signing my name below, I, Victor Garrett, attest that this documentation has been prepared under the direction and in the presence of Victor Morgan, MD . Electronically Signed: Evelene Garrett, Scribe. 05/11/2016. 1:39 AM.   History   Chief Complaint Chief Complaint  Patient presents with  . Chest Pain    The history is provided by the patient. No language interpreter was used.     HPI Comments:  Victor Garrett is a 65 y.o. male with a history of AFIB,  whipple procedure, and DM, who presents to the Emergency Department complaining of left sided CP which began ~10:30 PM while at rest. He describes the pain as a heaviness and notes radiation of pain into his back. He notes his pain was  8/10 at time of onset and is a 3/10 at this time; no alleviating factors noted. Pt also notes swelling in his ankles  He denies exacerbation of pain with exertion and h/o similar pain. He also denies SOB, numbness/ weakness, nausea, vomiting, and diaphoresis. Pt has a family h/o of CAD < 42 y/o--his father at age 23. No smoking hx.    Past Medical History:  Diagnosis Date  . Abdominal pain    recurrent  . Abnormal liver function test    hx  . Acute gastrojejunal ulcer with perforation but without obstruction ([place])   . Anxiety   . Bile duct stricture    hx  . Depressed   . Diabetes mellitus   . Diverticula of colon   . Elevated prostate specific antigen (PSA)   . Gallstones   . Gastritis, acute   . Irritable bowel syndrome   . Low HDL (under 40)   . Mitral regurgitation   . Pancreatitis    chronic  . Paroxysmal atrial fibrillation (HCC)   . Sleep apnea     Patient Active Problem List   Diagnosis Date Noted  . Chest pain 05/11/2016  . GERD (gastroesophageal reflux disease) 05/11/2016  . Type 2 diabetes  mellitus with hyperglycemia ([place]) 04/08/2015  . Abdominal pain of unknown etiology 09/23/2014  . Shingles outbreak 05/11/2014  . Vitamin D deficiency 01/16/2012  . ELEVATED PROSTATE SPECIFIC ANTIGEN 05/09/2010  . MITRAL REGURGITATION 02/09/2010  . DEPRESSION 06/22/2009  . GALLSTONES 06/22/2009  . ANXIETY 03/13/2009  . FATIGUE 03/08/2009  . BRONCHITIS, ACUTE 02/23/2009  . 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 Diverticulosis of large intestine 07/28/2008  . PAROXYSMAL ATRIAL FIBRILLATION 06/11/2007  . ULCER, ACUTE GASTROJEJUNAL W/PERF W/O OBST 06/07/2007  . GASTRITIS, ACUTE 06/07/2007  . IRRITABLE BOWEL SYNDROME 06/07/2007  . 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 PANCREATITIS, CHRONIC 06/07/2007  . SLEEP APNEA, MILD 06/07/2007  . Abdominal pain 06/07/2007  . LIVER FUNCTION TESTS, ABNORMAL 06/07/2007    Past Surgical History:  Procedure Laterality Date  . elap w/ suture of perf jejunal ulcer & drailage of lesser sac abscess  11/04   in [place]  . f/u ERCP  7/05   Dr. Harl Bowie at [facility] with sludge vs stones near junction of CBD & cystic duct, no stone in the [place]   by Dr. Dossie Der at [facility].- benign stricture in common bile duct, path showed fibrosis & nflammation       Home Medications    Prior to Admission  medications   Medication Sig Start Date End Date Taking? Authorizing Provider  cholecalciferol (VITAMIN D) 1000 UNITS tablet Take 6 tablets by mouth daily 01/16/12  Yes Noralee Space, MD  CREON 24000 UNITS CPEP TAKE 2 CAPSULES BY MOUTH EVERY DAY WITH A MEAL Patient taking differently: TAKE 3 CAPSULES BY MOUTH EVERY DAY WITH each  MEAL 09/01/15  Yes Noralee Space, MD  glipiZIDE (GLUCOTROL XL) 5 MG 24 hr tablet Take 1 tablet (5 mg total) by mouth daily with breakfast. Patient taking differently: Take 5 mg by mouth at bedtime.  02/10/16  Yes Philemon Kingdom, MD  pantoprazole (PROTONIX) 40 MG tablet TAKE 1 TABLET ONCE DAILY. NEEDS APPOINTMENT. 11/04/15   Yes Noralee Space, MD  sitaGLIPtin-metformin (JANUMET) 50-500 MG per tablet Take 2 tablets with breakfast 05/24/15  Yes Philemon Kingdom, MD  sucralfate (CARAFATE) 1 GM/10ML suspension Take 1 g by mouth 3 (three) times daily.  08/10/15 08/09/16 Yes Historical Provider, MD  [place] test strip TEST BLOOD SUGAR ONCE DAILY 09/01/15   Noralee Space, MD  Blood Glucose Monitoring Suppl (ACCU-CHEK AVIVA PLUS) W/DEVICE KIT TEST BLOOD SUGAR ONCE DAILY 08/13/14   Noralee Space, MD    Family History Family History  Problem Relation Age of Onset  . Brain cancer Mother   . Asthma Mother   . Emphysema Mother   . Coronary artery disease Father     previous, began in his 37s    Social History Social History  Substance Use Topics  . Smoking status: Never Smoker  . Smokeless tobacco: Never Used     Comment: nonsmoker  . Alcohol use No     Allergies   Penicillins   Review of Systems Review of Systems  Constitutional: Negative for chills, diaphoresis and fever.  Respiratory: Negative for shortness of breath.   Cardiovascular: Positive for chest pain and leg swelling. Negative for palpitations.  Gastrointestinal: Negative for abdominal pain, nausea and vomiting.  Neurological: Negative for weakness and numbness.  All other systems reviewed and are negative.   Physical Exam Updated Vital Signs BP 115/75 (BP Location: Left Arm)   Pulse 64   Temp 97.4 F (36.3 C) (Oral)   Resp 16   Ht 5' 11.5" (1.816 m)   Wt 186 lb 3.2 oz (84.5 kg)   SpO2 97%   BMI 25.61 kg/m   Physical Exam  Constitutional: He is oriented to person, place, and time. He appears well-developed and well-nourished. No distress.  HENT:  Head: Normocephalic and atraumatic.  Eyes: Conjunctivae are normal.  Cardiovascular: Normal rate, regular rhythm and normal heart sounds.   Pulmonary/Chest: Effort normal and breath sounds normal. No respiratory distress.  Abdominal: Soft. He exhibits no distension. There is  no tenderness.  Musculoskeletal: He exhibits no edema.  Neurological: He is alert and oriented to person, place, and time.  Skin: Skin is warm and dry.  Psychiatric: He has a normal mood and affect.  Nursing note and vitals reviewed.    ED Treatments / Results  DIAGNOSTIC STUDIES:  Oxygen Saturation is 97% on RA, normal by my interpretation.    COORDINATION OF CARE:  1:27 AM Discussed treatment plan with pt at bedside and pt agreed to plan.  Labs (all labs ordered are listed, but only abnormal results are displayed) Labs Reviewed  BASIC METABOLIC PANEL - Abnormal; Notable for the following:       Result Value   Glucose, Bld 251 (*)    Calcium 8.7 (*)  All other components within normal limits  HEPATIC FUNCTION PANEL - Abnormal; Notable for the following:    ALT 14 (*)    Indirect Bilirubin 0.2 (*)    All other components within normal limits  HEMOGLOBIN A1C - Abnormal; Notable for the following:    Hgb A1c MFr Bld 7.5 (*)    All other components within normal limits  LIPID PANEL - Abnormal; Notable for the following:    Triglycerides 180 (*)    HDL 27 (*)    All other components within normal limits  GLUCOSE, CAPILLARY - Abnormal; Notable for the following:    Glucose-Capillary 149 (*)    All other components within normal limits  GLUCOSE, CAPILLARY - Abnormal; Notable for the following:    Glucose-Capillary 110 (*)    All other components within normal limits  GLUCOSE, CAPILLARY - Abnormal; Notable for the following:    Glucose-Capillary 199 (*)    All other components within normal limits  GLUCOSE, CAPILLARY - Abnormal; Notable for the following:    Glucose-Capillary 256 (*)    All other components within normal limits  GLUCOSE, CAPILLARY - Abnormal; Notable for the following:    Glucose-Capillary 257 (*)    All other components within normal limits  GLUCOSE, CAPILLARY - Abnormal; Notable for the following:    Glucose-Capillary 161 (*)    All other components  within normal limits  CBC  LIPASE, BLOOD  URINE RAPID DRUG SCREEN, HOSP PERFORMED  TROPONIN I  TROPONIN I  TROPONIN I  BRAIN NATRIURETIC PEPTIDE  PROTIME-INR  I-STAT TROPOININ, ED    EKG  EKG Interpretation  Date/Time:  Thursday May 11 2016 00:36:41 EDT Ventricular Rate:  70 PR Interval:    QRS Duration: 96 QT Interval:  391 QTC Calculation: 422 R Axis:   58 Text Interpretation:  Sinus rhythm Low voltage, extremity and precordial leads Slight peaking of TW in anterior leads in comparison to prior Otherwise no significant change Confirmed by [facility] MD, Aurielle Slingerland ([id number]) on 05/11/2016 12:58:43 AM Also confirmed by [facility] MD, Chasitty Hehl ([id number]), editor [place], Leda Gauze [phone])  on 05/11/2016 7:33:36 AM       Radiology Dg Chest 2 View  Result Date: 05/11/2016 CLINICAL DATA:  Acute chest pain today. EXAM: CHEST  2 VIEW COMPARISON:  06/27/2013 FINDINGS: The cardiomediastinal silhouette is unremarkable. There is no evidence of focal airspace disease, pulmonary edema, suspicious pulmonary nodule/mass, pleural effusion, or pneumothorax. No acute bony abnormalities are identified. IMPRESSION: No active cardiopulmonary disease. Electronically Signed   By: Margarette Canada M.D.   On: 05/11/2016 01:47   Nm Myocar Multi W/spect W/wall Motion / Ef  Result Date: 05/11/2016 CLINICAL DATA:  65 year old male with acute chest pain. History of diabetes and obstructive sleep apnea. EXAM: MYOCARDIAL IMAGING WITH SPECT (REST AND PHARMACOLOGIC-STRESS) GATED LEFT VENTRICULAR WALL MOTION STUDY LEFT VENTRICULAR EJECTION FRACTION TECHNIQUE: Standard myocardial SPECT imaging was performed after resting intravenous injection of 10 mCi Tc-70mtetrofosmin. Subsequently, intravenous infusion of Lexiscan was performed under the supervision of the Cardiology staff. At peak effect of the drug, 30 mCi Tc-954metrofosmin was injected intravenously and standard myocardial SPECT imaging was performed. Quantitative gated imaging  was also performed to evaluate left ventricular wall motion, and estimate left ventricular ejection fraction. COMPARISON:  None. FINDINGS: Perfusion: No decreased activity in the left ventricle on stress imaging to suggest reversible ischemia or infarction. Wall Motion: Mild septal hypokinesis identified. No left ventricular dilation. Left Ventricular Ejection Fraction: 53 % End diastolic volume 10376  ml End systolic volume 47 ml IMPRESSION: 1. No reversible ischemia or infarction. 2. Mild septal hypokinesis. No other regional wall motion abnormalities identified. 3. Left ventricular ejection fraction 53% 4. Non invasive risk stratification*: Low *2012 Appropriate Use Criteria for Coronary Revascularization Focused Update: J Am Coll Cardiol. 1308;65(7):846-962. http://content.[email or web address]?[email or web address] Electronically Signed   By: Margarette Canada M.D.   On: 05/11/2016 18:30    Procedures Procedures (including critical care time)  Medications Ordered in ED Medications  nitroGLYCERIN (NITROSTAT) SL tablet 0.4 mg (0.4 mg Sublingual Given 05/11/16 0201)  sucralfate (CARAFATE) 1 GM/10ML suspension 1 g (1 g Oral Given 05/12/16 0829)  pantoprazole (PROTONIX) EC tablet 40 mg (40 mg Oral Given 05/12/16 0829)  lipase/protease/amylase (CREON) capsule 24,000 Units (24,000 Units Oral Given 05/12/16 0829)  cholecalciferol (VITAMIN D) tablet 6,000 Units (6,000 Units Oral Given 05/12/16 0829)  morphine 2 MG/ML injection 2 mg (not administered)  acetaminophen (TYLENOL) tablet 650 mg (not administered)  ondansetron (ZOFRAN) injection 4 mg (not administered)  heparin injection 5,000 Units (5,000 Units Subcutaneous Given 05/12/16 0541)  zolpidem (AMBIEN) tablet 5 mg (not administered)  ALPRAZolam (XANAX) tablet 0.25 mg (not administered)  insulin aspart (novoLOG) injection 0-9 Units (2 Units Subcutaneous Given 05/12/16 0830)  insulin aspart (novoLOG) injection 0-5 Units (3 Units Subcutaneous Given 05/11/16 2221)   pneumococcal 23 valent vaccine (PNU-IMMUNE) injection 0.5 mL (not administered)  aspirin chewable tablet 324 mg (324 mg Oral Given 05/11/16 0150)  aspirin chewable tablet 81 mg (81 mg Oral Given 05/11/16 0807)     Initial Impression / Assessment and Plan / ED Course  I have reviewed the triage vital signs and the nursing notes.  Pertinent labs & imaging results that were available during my care of the patient were reviewed by me and considered in my medical decision making (see chart for details).  Clinical Course   65 year old male with a history of atrial fibrillation, diabetes, fam hx of CAD, pancreatic mass status post Whipple presents with concern for left-sided chest heaviness.  EKG with mild increase in T wave but no sign of de winter's, no sign of pericarditis. Chest x-ray shows no acute abnormalities. Troponin is negative. Have low suspicion for pulmonary embolus, given patient without dyspnea, no tachycardia, no tachypnea no asymmetric leg swelling. Patient is at high risk hear score given age, risk factors, description of pain. Given ASA/nitro. Nitro improved pain. Admitted for further care.  Final Clinical Impressions(s) / ED Diagnoses   Final diagnoses:  Chest pain  Chest pain, unspecified chest pain type  Type 2 diabetes mellitus with hyperglycemia, without long-term current use of insulin ([place])    New Prescriptions Current Discharge Medication List    I personally performed the services described in this documentation, which was scribed in my presence. The recorded information has been reviewed and is accurate.     Victor Morgan, MD 05/12/16 [phone]

## 2016-05-11 NOTE — Progress Notes (Signed)
     The patient was seen in nuclear medicine for a lexiscan myoview. He tolerated the procedure well. No acute ST or TW changes on ECG. Await nuclear images.    Kaitlynn Tramontana Stern PA-C  [facility]    

## 2016-05-11 NOTE — Progress Notes (Signed)
Inpatient Diabetes Program Recommendations  AACE/ADA: New Consensus Statement on Inpatient Glycemic Control (2015)  Target Ranges:  Prepandial:   less than 140 mg/dL      Peak postprandial:   less than 180 mg/dL (1-2 hours)      Critically ill patients:  140 - 180 mg/dL   Lab Results  Component Value Date   GLUCAP 149 (H) 05/11/2016   HGBA1C 8.0 02/10/2016    Review of Glycemic Control  Diabetes history: DM2 Outpatient Diabetes medications: glipizide 5 mg QD, Janumet 50/500 bid Current orders for Inpatient glycemic control: Novolog sensitive tidwc and hs  Inpatient Diabetes Program Recommendations:    Please change Novolog to sensitve Q4H while NPO Updated HgbA1C - last one 02/10/2016 - 8% When diet advanced, CHO mod med diet.  Will follow. Thank you. Victor Garrett, RD, LDN, CDE Inpatient Diabetes Coordinator [phone]

## 2016-05-11 NOTE — Care Management Note (Signed)
Case Management Note  Patient Details  Name: Victor RoadsGeorge H Idris MRN: [id number] Date of Birth: 10/02/1951  Subjective/Objective:  65 y/o m admitted w/Chest pain. From home.Cardio following.                  Action/Plan:d/c plan home.   Expected Discharge Date:                 Expected Discharge Plan:  Home/Self Care  In-House Referral:     Discharge planning Services     Post Acute Care Choice:    Choice offered to:     DME Arranged:    DME Agency:     HH Arranged:    HH Agency:     Status of Service:  In process, will continue to follow  If discussed at Long Length of Stay Meetings, dates discussed:    Additional Comments:  Lanier ClamMahabir, Derriona Branscom, RN 05/11/2016, 12:17 PM

## 2016-05-11 NOTE — ED Notes (Signed)
Patient transported to X-ray 

## 2016-05-12 DIAGNOSIS — K219 Gastro-esophageal reflux disease without esophagitis: Secondary | ICD-10-CM | POA: Diagnosis not present

## 2016-05-12 DIAGNOSIS — E1165 Type 2 diabetes mellitus with hyperglycemia: Secondary | ICD-10-CM | POA: Diagnosis not present

## 2016-05-12 DIAGNOSIS — R079 Chest pain, unspecified: Secondary | ICD-10-CM | POA: Diagnosis not present

## 2016-05-12 LAB — HEMOGLOBIN A1C
Hgb A1c MFr Bld: 7.5 % — ABNORMAL HIGH (ref 4.8–5.6)
Mean Plasma Glucose: 169 mg/dL

## 2016-05-12 LAB — GLUCOSE, CAPILLARY: Glucose-Capillary: 161 mg/dL — ABNORMAL HIGH (ref 65–99)

## 2016-05-12 NOTE — Discharge Summary (Signed)
Physician Discharge Summary  DARELL Garrett TDS:[id number] DOB: 19-Apr-1951 DOA: 05/11/2016  PCP: Noralee Space, MD  Admit date: 05/11/2016 Discharge date: 05/12/2016  Admitted From: Home Discharge disposition: Home   Recommendations for Outpatient Follow-Up:   1. F/U with PCP for recurrent symptoms.   Discharge Diagnosis:   Principal Problem:    Chest pain, likely indigestion related Active Problems:    Type 2 diabetes mellitus with hyperglycemia (HCC)    GERD (gastroesophageal reflux disease)   Discharge Condition: Improved.  Diet recommendation: Low sodium, heart healthy.  Carbohydrate-modified.    History of Present Illness:   Victor Garrett is an 65 y.o. male with a PMH of DM-2, pancreatitis s/p Whipple procedure who was admitted 05/11/16 for evaluation of non-exertional chest pain.  Hospital Course by Problem:   Principal Problem:   Chest pain Has a +FH of early MI, DM, so a stress test was performed which showed no reversible ischemia or infarction, mild septal hypokinesis, with an EF of 53% (low risk).  2-D echo showed an EF 55-60 percent..  Troponins negative x 3.  LDL 62.    Active Problems:   Type 2 diabetes mellitus with hyperglycemia ([place] Currently being managed by insulin sensitive SSI. CBGs 110-199. Resume home meds at D/C.     GERD (gastroesophageal reflux disease) Continue PPI.    Medical Consultants:    None.   Discharge Exam:   Vitals:   05/11/16 2043 05/12/16 0526  BP: 120/64 115/75  Pulse: 67 64  Resp: 16 16  Temp: 97.9 F (36.6 C) 97.4 F (36.3 C)   Vitals:   05/11/16 0634 05/11/16 1539 05/11/16 2043 05/12/16 0526  BP: 105/71 103/80 120/64 115/75  Pulse: 94 75 67 64  Resp: _0 Temp: 97.9 F (36.6 C) 98.4 F (36.9 C) 97.9 F (36.6 C) 97.4 F (36.3 C)  TempSrc: Oral Oral Oral Oral  SpO2: 98% 99% 99% 97%  Weight:      Height:        General exam: Appears calm and comfortable.  Respiratory system:  Clear to auscultation. Respiratory effort normal. Cardiovascular system: S1 & S2 heard, RRR. No JVD,  rubs, gallops or clicks. No murmurs. Gastrointestinal system: Abdomen is nondistended, soft and nontender. No organomegaly or masses felt. Normal bowel sounds heard. Central nervous system: Alert and oriented. No focal neurological deficits. Extremities: No clubbing,  or cyanosis. No edema. Skin: No rashes, lesions or ulcers. Psychiatry: Judgement and insight appear normal. Mood & affect appropriate.    The results of significant diagnostics from this hospitalization (including imaging, microbiology, ancillary and laboratory) are listed below for reference.     Procedures and Diagnostic Studies:   Dg Chest 2 View  Result Date: 05/11/2016 CLINICAL DATA:  Acute chest pain today. EXAM: CHEST  2 VIEW COMPARISON:  06/27/2013 FINDINGS: The cardiomediastinal silhouette is unremarkable. There is no evidence of focal airspace disease, pulmonary edema, suspicious pulmonary nodule/mass, pleural effusion, or pneumothorax. No acute bony abnormalities are identified. IMPRESSION: No active cardiopulmonary disease. Electronically Signed   By: Margarette Canada M.D.   On: 05/11/2016 01:47   Nm Myocar Multi W/spect W/wall Motion / Ef  Result Date: 05/11/2016 CLINICAL DATA:  65 year old male with acute chest pain. History of diabetes and obstructive sleep apnea. EXAM: MYOCARDIAL IMAGING WITH SPECT (REST AND PHARMACOLOGIC-STRESS) GATED LEFT VENTRICULAR WALL MOTION STUDY LEFT VENTRICULAR EJECTION FRACTION TECHNIQUE: Standard myocardial SPECT imaging was performed after resting intravenous injection of 10 mCi Tc-50mtetrofosmin. Subsequently,  intravenous infusion of Lexiscan was performed under the supervision of the Cardiology staff. At peak effect of the drug, 30 mCi Tc-59mtetrofosmin was injected intravenously and standard myocardial SPECT imaging was performed. Quantitative gated imaging was also performed to evaluate  left ventricular wall motion, and estimate left ventricular ejection fraction. COMPARISON:  None. FINDINGS: Perfusion: No decreased activity in the left ventricle on stress imaging to suggest reversible ischemia or infarction. Wall Motion: Mild septal hypokinesis identified. No left ventricular dilation. Left Ventricular Ejection Fraction: 53 % End diastolic volume 1973ml End systolic volume 47 ml IMPRESSION: 1. No reversible ischemia or infarction. 2. Mild septal hypokinesis. No other regional wall motion abnormalities identified. 3. Left ventricular ejection fraction 53% 4. Non invasive risk stratification*: Low *2012 Appropriate Use Criteria for Coronary Revascularization Focused Update: J Am Coll Cardiol. 25329;92(4):268-341 http://content.[email or web address]?articleid=[email or web address] Electronically Signed   By: JMargarette CanadaM.D.   On: 05/11/2016 18:30     Labs:   Basic Metabolic Panel:  Recent Labs Lab 05/11/16 0038  NA 137  K 4.2  CL 105  CO2 25  GLUCOSE 251*  BUN 12  CREATININE 0.92  CALCIUM 8.7*   GFR Estimated Creatinine Clearance: 86.6 mL/min (by C-G formula based on SCr of 0.92 mg/dL). Liver Function Tests:  Recent Labs Lab 05/11/16 0038  AST 19  ALT 14*  ALKPHOS 50  BILITOT 0.3  PROT 6.5  ALBUMIN 4.1    Recent Labs Lab 05/11/16 0038  LIPASE 15   Coagulation profile  Recent Labs Lab 05/11/16 0456  INR 1.01    CBC:  Recent Labs Lab 05/11/16 0038  WBC 7.4  HGB 13.0  HCT 39.2  MCV 84.7  PLT 167   Cardiac Enzymes:  Recent Labs Lab 05/11/16 0456 05/11/16 0928 05/11/16 1549  TROPONINI <0.03 <0.03 <0.03   BNP: Invalid input(s): POCBNP CBG:  Recent Labs Lab 05/11/16 1143 05/11/16 1640 05/11/16 2039 05/11/16 2210 05/12/16 0735  GLUCAP 110* 199* 256* 257* 161*   D-Dimer No results for input(s): DDIMER in the last 72 hours. Hgb A1c  Recent Labs  05/11/16 0456  HGBA1C 7.5*   Lipid Profile  Recent Labs  05/11/16 0456  CHOL  125  HDL 27*  LDLCALC 62  TRIG 180*  CHOLHDL 4.6     Discharge Instructions:   Discharge Instructions    Call MD for:  extreme fatigue    Complete by:  As directed   Call MD for:  severe uncontrolled pain    Complete by:  As directed   Diet - low sodium heart healthy    Complete by:  As directed   Diet Carb Modified    Complete by:  As directed   Increase activity slowly    Complete by:  As directed       Medication List    TAKE these medications   ACCU-CHEK AVIVA PLUS test strip Generic drug:  glucose blood TEST BLOOD SUGAR ONCE DAILY   ACCU-CHEK AVIVA PLUS w/Device Kit TEST BLOOD SUGAR ONCE DAILY   cholecalciferol 1000 units tablet Commonly known as:  VITAMIN D Take 6 tablets by mouth daily   CREON 24000 units Cpep Generic drug:  Pancrelipase (Lip-Prot-Amyl) TAKE 2 CAPSULES BY MOUTH EVERY DAY WITH A MEAL What changed:  See the new instructions. Notes to patient:  With lunch   glipiZIDE 5 MG 24 hr tablet Commonly known as:  GLUCOTROL XL Take 1 tablet (5 mg total) by mouth daily with breakfast. What changed:  when to take  this  Another medication with the same name was removed. Continue taking this medication, and follow the directions you see here.   pantoprazole 40 MG tablet Commonly known as:  PROTONIX TAKE 1 TABLET ONCE DAILY. NEEDS APPOINTMENT.   sitaGLIPtin-metformin 50-500 MG tablet Commonly known as:  JANUMET Take 2 tablets with breakfast   sucralfate 1 GM/10ML suspension Commonly known as:  CARAFATE Take 1 g by mouth 3 (three) times daily.      Follow-up Information    NADEL,SCOTT M, MD. Schedule an appointment as soon as possible for a visit today.   Specialty:  Pulmonary Disease Why:  At your usual follow up interval.   Contact information: [place] [address] [phone]            Time coordinating discharge: 25 minutes.  Signed:  Tighe Gitto  Pager [phone] [facility] 05/12/2016, 8:58  AM

## 2016-05-12 NOTE — Discharge Instructions (Signed)

## 2016-05-15 ENCOUNTER — Ambulatory Visit: Payer: BLUE CROSS/BLUE SHIELD | Admitting: Internal Medicine

## 2016-05-15 ENCOUNTER — Other Ambulatory Visit: Payer: Self-pay | Admitting: Internal Medicine

## 2016-07-01 ENCOUNTER — Other Ambulatory Visit: Payer: Self-pay | Admitting: Internal Medicine

## 2016-07-06 ENCOUNTER — Other Ambulatory Visit: Payer: Self-pay

## 2016-07-09 ENCOUNTER — Other Ambulatory Visit: Payer: Self-pay | Admitting: Internal Medicine

## 2016-07-10 ENCOUNTER — Other Ambulatory Visit: Payer: Self-pay

## 2016-07-10 MED ORDER — SITAGLIPTIN PHOS-METFORMIN HCL 50-500 MG PO TABS: ORAL_TABLET | ORAL | 0 refills | Status: DC

## 2016-07-11 ENCOUNTER — Ambulatory Visit: Payer: BLUE CROSS/BLUE SHIELD | Admitting: Internal Medicine

## 2016-08-27 ENCOUNTER — Emergency Department (HOSPITAL_COMMUNITY)
Admission: EM | Admit: 2016-08-27 | Discharge: 2016-08-27 | Disposition: A | Discharge status: Home / Self Care | Payer: BLUE CROSS/BLUE SHIELD | Attending: Emergency Medicine | Admitting: Emergency Medicine

## 2016-08-27 ENCOUNTER — Encounter (HOSPITAL_COMMUNITY): Payer: Self-pay

## 2016-08-27 DIAGNOSIS — E119 Type 2 diabetes mellitus without complications: Secondary | ICD-10-CM | POA: Diagnosis not present

## 2016-08-27 DIAGNOSIS — R300 Dysuria: Secondary | ICD-10-CM | POA: Diagnosis not present

## 2016-08-27 DIAGNOSIS — Z7984 Long term (current) use of oral hypoglycemic drugs: Secondary | ICD-10-CM | POA: Insufficient documentation

## 2016-08-27 LAB — URINALYSIS, ROUTINE W REFLEX MICROSCOPIC
Glucose, UA: >1000 mg/dL — AB
Hgb urine dipstick: NEGATIVE
Ketones, ur: NEGATIVE mg/dL
Leukocytes, UA: NEGATIVE
Nitrite: NEGATIVE
Protein, ur: 100 mg/dL — AB
Specific Gravity, Urine: 1.027 (ref 1.005–1.030)
pH: 5 (ref 5.0–8.0)

## 2016-08-27 LAB — URINE MICROSCOPIC-ADD ON

## 2016-08-27 LAB — CBG MONITORING, ED: Glucose-Capillary: 257 mg/dL — ABNORMAL HIGH (ref 65–99)

## 2016-08-27 MED ORDER — PHENAZOPYRIDINE HCL 200 MG PO TABS: 200.0000 mg | ORAL_TABLET | Freq: Three times a day (TID) | ORAL | 0 refills | Status: DC

## 2016-08-27 NOTE — ED Triage Notes (Signed)
Patient here with dysuria and frequency with CBG running greater than 300 for 3-4 days. Patient states that he developed hematuria x 1 day

## 2016-08-27 NOTE — ED Provider Notes (Signed)
[facility]-EMERGENCY DEPT Provider Note   CSN: [id number] Arrival date & time: 08/27/16  1517   History   Chief Complaint Chief Complaint  Patient presents with  . Hyperglycemia  . Hematuria    HPI   Blood pressure 138/79, pulse 84, temperature 98.2 F (36.8 C), temperature source Oral, resp. rate 18, weight 85.3 kg, SpO2 98 %.  TORRELL KRUTZ is a 65 y.o. male with past medical history significant for non-insulin-dependent diabetes (Whipple procedure in the remote past) complaining of urinary frequency with dysuria starting 4 days ago, he noticed some hematuria this morning. He denies any rectal pain or pain with bowel movements but states that there is a mild unusual pressure around the anus when he urinates. He denies fever, chills, flank pain, nausea, vomiting. His blood sugars have been running higher than normal when he checked this morning they read as high. He has been compliant with his Janumet and metformin. He denies any testicular pain or swelling, abdominal pain, reduced by mouth intake. He does states that he is under a large amount of stress as she is downsizing and has to move in the next few weeks. Has history of enlarged prostate and he follows at [facility] for this. No concern for STDs.   Past Medical History:  Diagnosis Date  . Abdominal pain    recurrent  . Abnormal liver function test    hx  . Acute gastrojejunal ulcer with perforation but without obstruction (HCC)   . Anxiety   . Bile duct stricture    hx  . Depressed   . Diabetes mellitus   . Diverticula of colon   . Elevated prostate specific antigen (PSA)   . Gallstones   . Gastritis, acute   . Irritable bowel syndrome   . Low HDL (under 40)   . Mitral regurgitation   . Pancreatitis    chronic  . Paroxysmal atrial fibrillation (HCC)   . Sleep apnea     Patient Active Problem List   Diagnosis Date Noted  . Chest pain 05/11/2016  . GERD (gastroesophageal reflux disease) 05/11/2016  . Type  2 diabetes mellitus with hyperglycemia (HCC) 04/08/2015  . Abdominal pain of unknown etiology 09/23/2014  . Shingles outbreak 05/11/2014  . Vitamin D deficiency 01/16/2012  . ELEVATED PROSTATE SPECIFIC ANTIGEN 05/09/2010  . MITRAL REGURGITATION 02/09/2010  . DEPRESSION 06/22/2009  . GALLSTONES 06/22/2009  . ANXIETY 03/13/2009  . FATIGUE 03/08/2009  . BRONCHITIS, ACUTE 02/23/2009  . 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 Diverticulosis of large intestine 07/28/2008  . PAROXYSMAL ATRIAL FIBRILLATION 06/11/2007  . ULCER, ACUTE GASTROJEJUNAL W/PERF W/O OBST 06/07/2007  . GASTRITIS, ACUTE 06/07/2007  . IRRITABLE BOWEL SYNDROME 06/07/2007  . 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 PANCREATITIS, CHRONIC 06/07/2007  . SLEEP APNEA, MILD 06/07/2007  . Abdominal pain 06/07/2007  . LIVER FUNCTION TESTS, ABNORMAL 06/07/2007    Past Surgical History:  Procedure Laterality Date  . elap w/ suture of perf jejunal ulcer & drailage of lesser sac abscess  11/04   in [place]  . f/u ERCP  7/05   Dr. Wyline Mood at [facility] with sludge vs stones near junction of CBD & cystic duct, no stone in the GB  . WHIPPLE PROCEDURE  2000   by Dr. Jacquenette Shone at [facility].- benign stricture in common bile duct, path showed fibrosis & nflammation       Home Medications    Prior to Admission medications   Medication Sig Start Date End Date Taking? Authorizing  Provider  ACCU-CHEK AVIVA PLUS test strip TEST BLOOD SUGAR ONCE DAILY 09/01/15   Noralee Space, MD  Blood Glucose Monitoring Suppl (ACCU-CHEK AVIVA PLUS) W/DEVICE KIT TEST BLOOD SUGAR ONCE DAILY 08/13/14   Noralee Space, MD  cholecalciferol (VITAMIN D) 1000 UNITS tablet Take 6 tablets by mouth daily 01/16/12   Noralee Space, MD  CREON 24000 UNITS CPEP TAKE 2 CAPSULES BY MOUTH EVERY DAY WITH A MEAL Patient taking differently: TAKE 3 CAPSULES BY MOUTH EVERY DAY WITH each  MEAL 09/01/15   Noralee Space, MD  glipiZIDE (GLUCOTROL XL) 5 MG 24 hr tablet Take 1 tablet (5 mg  total) by mouth daily with breakfast. Patient taking differently: Take 5 mg by mouth at bedtime.  02/10/16   Philemon Kingdom, MD  pantoprazole (PROTONIX) 40 MG tablet TAKE 1 TABLET ONCE DAILY. NEEDS APPOINTMENT. 11/04/15   Noralee Space, MD  phenazopyridine (PYRIDIUM) 200 MG tablet Take 1 tablet (200 mg total) by mouth 3 (three) times daily. 08/27/16   Zandria Woldt, PA-C  sitaGLIPtin-metformin (JANUMET) 50-500 MG tablet TAKE 2 TABLETS BY MOUTH WITH BREAKFAST 07/10/16   Philemon Kingdom, MD  sucralfate (CARAFATE) 1 GM/10ML suspension Take 1 g by mouth 3 (three) times daily.  08/10/15 08/09/16  Historical Provider, MD    Family History Family History  Problem Relation Age of Onset  . Brain cancer Mother   . Asthma Mother   . Emphysema Mother   . Coronary artery disease Father     previous, began in his 15s    Social History Social History  Substance Use Topics  . Smoking status: Never Smoker  . Smokeless tobacco: Never Used     Comment: nonsmoker  . Alcohol use No     Allergies   Penicillins   Review of Systems Review of Systems    Physical Exam Updated Vital Signs BP (!) 129/107   Pulse 73   Temp 98.2 F (36.8 C) (Oral)   Resp 18   Wt 85.3 kg   SpO2 97%   BMI 25.86 kg/m   Physical Exam  Constitutional: He is oriented to person, place, and time. He appears well-developed and well-nourished. No distress.  HENT:  Head: Normocephalic and atraumatic.  Mouth/Throat: Oropharynx is clear and moist.  Eyes: Conjunctivae and EOM are normal. Pupils are equal, round, and reactive to light.  Neck: Normal range of motion.  Cardiovascular: Normal rate, regular rhythm and intact distal pulses.   Pulmonary/Chest: Effort normal and breath sounds normal.  Abdominal: Soft. There is no tenderness.  Genitourinary: Penis normal. Rectal exam shows guaiac negative stool. No penile tenderness.  Genitourinary Comments: Digital rectal exam is chaperoned by nurse: No rashes or lesions,  normal rectal tone, normal stool color, prostate is enlarged but not boggy or tender.  Musculoskeletal: Normal range of motion.  Neurological: He is alert and oriented to person, place, and time.  Skin: He is not diaphoretic. Erythema: npmdmshared.  Psychiatric: He has a normal mood and affect.  Nursing note and vitals reviewed.    ED Treatments / Results  Labs (all labs ordered are listed, but only abnormal results are displayed) Labs Reviewed  URINALYSIS, ROUTINE W REFLEX MICROSCOPIC (NOT AT [facility]) - Abnormal; Notable for the following:       Result Value   Color, Urine AMBER (*)    Glucose, UA >1000 (*)    Bilirubin Urine SMALL (*)    Protein, ur 100 (*)    All other components within normal  limits  URINE MICROSCOPIC-ADD ON - Abnormal; Notable for the following:    Squamous Epithelial / LPF 0-5 (*)    Bacteria, UA RARE (*)    All other components within normal limits  CBG MONITORING, ED - Abnormal; Notable for the following:    Glucose-Capillary 257 (*)    All other components within normal limits  URINE CULTURE  GC/CHLAMYDIA PROBE AMP ([place]) NOT AT [facility]    EKG  EKG Interpretation None       Radiology No results found.  Procedures Procedures (including critical care time)  Medications Ordered in ED Medications - No data to display   Initial Impression / Assessment and Plan / ED Course  I have reviewed the triage vital signs and the nursing notes.  Pertinent labs & imaging results that were available during my care of the patient were reviewed by me and considered in my medical decision making (see chart for details).  Clinical Course    Vitals:   08/27/16 1529 08/27/16 1552 08/27/16 1630 08/27/16 1736  BP: 130/81 138/79 116/77 (!) 129/107  Pulse: 79 84 76 73  Resp: '16 18 16 18  '$ Temp: 98.2 F (36.8 C)     TempSrc: Oral     SpO2: 98% 98% 96% 97%  Weight: 85.3 kg       Medications - No data to display  AGASTYA MEISTER is 65 y.o. male  presenting with Urinary frequency and dysuria with associated hematuria onset this. Rectal fracture with elevated blood sugar, reading as high at home but it is under 300 in the ED, abdominal exam is benign, patient does not report any testicular pain swelling, fever chills or signs of systemic infection.  Analysis is without signs of infection, urine culture is pending, digital rectal exam with no tenderness to palpation or bogginess.  Encouraged patient to check sugars urology or primary care.  This is a shared visit with the attending physician who personally evaluated the patient and agrees with the care plan.   Evaluation does not show pathology that would require ongoing emergent intervention or inpatient treatment. Pt is hemodynamically stable and mentating appropriately. Discussed findings and plan with patient/guardian, who agrees with care plan. All questions answered. Return precautions discussed and outpatient follow up given.   Final Clinical Impressions(s) / ED Diagnoses   Final diagnoses:  Dysuria    New Prescriptions New Prescriptions   PHENAZOPYRIDINE (PYRIDIUM) 200 MG TABLET    Take 1 tablet (200 mg total) by mouth 3 (three) times daily.     Monico Blitz, PA-C 08/27/16 [place], MD 08/29/16 2013

## 2016-08-27 NOTE — ED Notes (Signed)
Chaperone rectal exam

## 2016-08-27 NOTE — Discharge Instructions (Signed)
Please check your blood sugar 2 times a day, push fluids.  Please follow with your primary care doctor in the next 2 days for a check-up. They must obtain records for further management.   Do not hesitate to return to the Emergency Department for any new, worsening or concerning symptoms.

## 2016-08-28 LAB — URINE CULTURE: Culture: NO GROWTH

## 2016-08-29 ENCOUNTER — Encounter: Payer: Self-pay | Admitting: Internal Medicine

## 2016-08-29 ENCOUNTER — Ambulatory Visit (INDEPENDENT_AMBULATORY_CARE_PROVIDER_SITE_OTHER): Payer: BLUE CROSS/BLUE SHIELD | Admitting: Internal Medicine

## 2016-08-29 VITALS — BP 130/80 | HR 80 | Wt 185.0 lb

## 2016-08-29 DIAGNOSIS — E1165 Type 2 diabetes mellitus with hyperglycemia: Secondary | ICD-10-CM

## 2016-08-29 LAB — POCT GLYCOSYLATED HEMOGLOBIN (HGB A1C): Hemoglobin A1C: 10.3

## 2016-08-29 MED ORDER — METFORMIN HCL 500 MG PO TABS: 500.0000 mg | ORAL_TABLET | Freq: Every day | ORAL | 3 refills | Status: DC

## 2016-08-29 MED ORDER — GLIPIZIDE ER 5 MG PO TB24: 5.0000 mg | ORAL_TABLET | Freq: Every day | ORAL | 3 refills | Status: DC

## 2016-08-29 NOTE — Progress Notes (Signed)
Patient ID: Victor Garrett, male   DOB: [birth year], 65 y.o.   MRN: [id number]  HPI: Victor Garrett is a 65 y.o.-year-old male, returning for f/u for DM2, dx 2008 - had Whipple procedure at [facility] in 2000 - removed 50% of the pancreas), non-insulin-dependent, uncontrolled, without complications.   Last visit 6.5 mo ago.  He was in the ED recently with dysuria/rectal fissure. Sugars in the 300s.   He has been eating out more. Sugars are higher. He even had a reading of "HI" since last visit.  Last hemoglobin A1c was: Lab Results  Component Value Date   HGBA1C 7.5 (H) 05/11/2016   HGBA1C 8.0 02/10/2016   HGBA1C 7.4 04/08/2015  Before last HbA1c, he has been eating a lot of fruit.  Pt was on a regimen of: - JanuMet 50-500 mg bid - Glimepiride 4 mg in am (increased in 07/2014) He had several episodes of pancreatitis after this up untill 2005-6.  He is now on: - Janumet 50-500 mg 2x a day in am - Metformin 1000 mg at dinnertime Not taking Glipizide XL 5 mg in a.m.- ??  Pt checks his sugars 2x a day and they are: - am: 130-160 >> 127-146 >> 111-141, 157 >> 131-192, 207 - 2h after b'fast: n/c >> 156-169, 175 >> n/c - before lunch: n/c >> 117-159, 169, 191 >> n/c - 2h after lunch: n/c >> 149-172, 200 >> 151-186 >> 164-191 - before dinner: n/c >> 156-164 >> 135, 153 >> 189-207 - 2h after dinner: n/c >> 134-187, 214 >> 141-161 >> n/c - bedtime: n/c >> 130-161 >> n/c - nighttime: n/c No lows. Lowest sugar was 117 >> 111; ? hypoglycemia awareness.  Highest sugar was 214 (eggs benedict) >> 186 >> HI.  Glucometer: Accu Check [employer]iva  Pt's meals are: - Breakfast: bagel or oatmeal + fruit - Lunch: sandwich - Dinner: eats out every night - Snacks: He does not eat large meals, but eats small meals   - no CKD, last BUN/creatinine:  Lab Results  Component Value Date   BUN 12 05/11/2016   CREATININE 0.92 05/11/2016   he is not on ACE inhibitor's. - last set of lipids: Lab Results   Component Value Date   CHOL 125 05/11/2016   HDL 27 (L) 05/11/2016   LDLCALC 62 05/11/2016   TRIG 180 (H) 05/11/2016   CHOLHDL 4.6 05/11/2016   he is not on a statin. - Not UTD with eye exams  - no numbness and tingling in his feet.  He was suspected to have pancreatic cancer >> Whipple procedure in 2000 >> but biopsy returned negative for cancer, only pancreatitis. He had a perforated ulcer in 2004. He also has paroxysmal Afib.  ROS: Constitutional: no weight gain/loss, no fatigue, no subjective hyperthermia/hypothermia Eyes: no blurry vision, no xerophthalmia ENT: no sore throat, no nodules palpated in throat, no dysphagia/odynophagia, no hoarseness Cardiovascular: no CP/SOB/palpitations/leg swelling Respiratory: no cough/SOB Gastrointestinal: no N/V/D/C/acid reflux Musculoskeletal: no muscle/no joint aches Skin: no rashes Neurological: no tremors/numbness/tingling/dizziness  I reviewed pt's medications, allergies, PMH, social hx, family hx, and changes were documented in the history of present illness. Otherwise, unchanged from my initial visit note:  Past Medical History:  Diagnosis Date  . Abdominal pain    recurrent  . Abnormal liver function test    hx  . Acute gastrojejunal ulcer with perforation but without obstruction ([place])   . Anxiety   . Bile duct stricture    hx  . Depressed   .  Diabetes mellitus   . Diverticula of colon   . Elevated prostate specific antigen (PSA)   . Gallstones   . Gastritis, acute   . Irritable bowel syndrome   . Low HDL (under 40)   . Mitral regurgitation   . Pancreatitis    chronic  . Paroxysmal atrial fibrillation (HCC)   . Sleep apnea    Past Surgical History:  Procedure Laterality Date  . elap w/ suture of perf jejunal ulcer & drailage of lesser sac abscess  11/04   in [place]  . f/u ERCP  7/05   Dr. Harl Bowie at [facility] with sludge vs stones near junction of CBD & cystic duct, no stone in the [place]    by Dr. Dossie Der at [facility].- benign stricture in common bile duct, path showed fibrosis & nflammation   History   Social History  . Marital Status: Married    Spouse Name: N/A  . Children: yes   Occupational History  .  sales    Social History Main Topics  . Smoking status: Never Smoker   . Smokeless tobacco: No     Comment: nonsmoker  . Alcohol Use: No  . Drug Use: No   Social History Narrative   Exercises= walks some. No caffeine use.    Current Outpatient Prescriptions on File Prior to Visit  Medication Sig Dispense Refill  . ACCU-CHEK AVIVA PLUS test strip TEST BLOOD SUGAR ONCE DAILY 100 each 0  . Blood Glucose Monitoring Suppl (ACCU-CHEK AVIVA PLUS) W/DEVICE KIT TEST BLOOD SUGAR ONCE DAILY 1 kit 0  . cholecalciferol (VITAMIN D) 1000 UNITS tablet Take 6 tablets by mouth daily 90 tablet 5  . CREON 24000 UNITS CPEP TAKE 2 CAPSULES BY MOUTH EVERY DAY WITH A MEAL (Patient taking differently: TAKE 3 CAPSULES BY MOUTH EVERY DAY WITH each  MEAL) 60 capsule 0  . glipiZIDE (GLUCOTROL XL) 5 MG 24 hr tablet Take 1 tablet (5 mg total) by mouth daily with breakfast. (Patient taking differently: Take 5 mg by mouth at bedtime. ) 90 tablet 1  . pantoprazole (PROTONIX) 40 MG tablet TAKE 1 TABLET ONCE DAILY. NEEDS APPOINTMENT. 30 tablet 0  . sucralfate (CARAFATE) 1 GM/10ML suspension Take 1 g by mouth 3 (three) times daily.      No current facility-administered medications on file prior to visit.    Allergies  Allergen Reactions  . Penicillins     REACTION: pt states "lethargy"   Family History  Problem Relation Age of Onset  . Brain cancer Mother   . Asthma Mother   . Emphysema Mother   . Coronary artery disease Father     previous, began in his 53s   PE: BP 130/80   Pulse 80   Wt 185 lb (83.9 kg)   SpO2 98%   BMI 25.44 kg/m  Body mass index is 25.44 kg/m. 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 DM2, non-insulin-dependent, uncontrolled, without long term complications, but with hyperglycemia -  we have discussed at previous visit about the fact that Januvia is not an optimal medication for him since he has a history of pancreatitis. No pancreatitis episodes after he started Januvia.   PLAN:  1. Patient with long-standing diabetes, on oral antidiabetic regimen, again returning after a long absence. He was not checking his sugars as advised and also stopped glipizide for an unknown reason. Sugars are higher and he had more dietary indiscretions lately. He also has a presumed UTI >> will see urology today. Today, his HbA1c is 10.3% - much higher.  - We again discussed about the importance of coming to his appointments, taking his medicines as advised, and checking his sugars. - We will add back glipizide, but I suspect that this would not be enough, and we will need to start insulin at next visit. I strongly advised him to start changing his diet.  - The sugars that he checked at home are not correlating with his HbA1c, therefore, I would like to also obtain a fructosamine level today. - I suggested to:  Patient Instructions  Please continue: - Janumet 50-500 mg 2x a day - Metformin 1000 mg at dinnertime  Please restart Glipizide ER 5 mg before b'fast and in 5 days, if sugars later in the day are still high, increase the dose to 10 mg before b'fast.  Use the new sugar log and check sugars 2x a day.  Please stop at the lab.  Please return in 1.5  months with your sugar log.   Please read the following book:  Alyssa Grove: "Program for Reversing Diabetes"  - continue checking sugars at different times of the day - check 1-2x a day, rotating checks - advised for yearly eye exams - he is due - Return to clinic in 1.5 mo with sugar log   Office Visit on 08/29/2016  Component Date Value Ref Range Status  . Fructosamine 08/31/2016 383* 190 - 270 umol/L Final  . Hemoglobin A1C 08/29/2016 10.3   Final   HbA1c calculated from the fructosamine his lower, at 8.1%.  Philemon Kingdom, MD PhD [facility] Endocrinology

## 2016-08-29 NOTE — Patient Instructions (Addendum)
Please continue: - Janumet 50-500 mg 2x a day - Metformin 1000 mg at dinnertime  Please restart Glipizide ER 5 mg before b'fast and in 5 days, if sugars later in the day are still high, increase the dose to 10 mg before b'fast.  Use the new sugar log and check sugars 2x a day.  Please stop at the lab.  Please return in 1.5 months with your sugar log.   Please read the following book:  Lequita AsalNeal Barnard: "Program for Reversing Diabetes"

## 2016-08-31 LAB — FRUCTOSAMINE: Fructosamine: 383 umol/L — ABNORMAL HIGH (ref 190–270)

## 2016-09-13 ENCOUNTER — Telehealth: Payer: Self-pay | Admitting: Internal Medicine

## 2016-09-13 NOTE — Telephone Encounter (Signed)
Patient need refill of sitaGLIPtin-metformin (JANUMET) 50-1000 MG tablet   Send to   [facility] [address], [place], [place] [id number] [phone]

## 2016-09-18 ENCOUNTER — Other Ambulatory Visit: Payer: Self-pay

## 2016-09-18 MED ORDER — SITAGLIPTIN PHOS-METFORMIN HCL 50-500 MG PO TABS: 1.0000 | ORAL_TABLET | Freq: Two times a day (BID) | ORAL | 0 refills | Status: DC

## 2016-09-18 MED ORDER — SITAGLIPTIN PHOS-METFORMIN HCL 50-500 MG PO TABS: 1.0000 | ORAL_TABLET | Freq: Two times a day (BID) | ORAL | 1 refills | Status: DC

## 2016-09-18 MED ORDER — SITAGLIPTIN PHOS-METFORMIN HCL 50-1000 MG PO TABS: 2.0000 | ORAL_TABLET | Freq: Every day | ORAL | 0 refills | Status: DC

## 2016-09-18 NOTE — Telephone Encounter (Signed)
Patient is  Calling on the status of last message. Please advise

## 2016-09-18 NOTE — Telephone Encounter (Signed)
Spoke to patient. Sent Rx to pharmacy requested.

## 2016-10-23 ENCOUNTER — Ambulatory Visit: Payer: BLUE CROSS/BLUE SHIELD | Admitting: Internal Medicine

## 2016-12-15 ENCOUNTER — Ambulatory Visit: Payer: BLUE CROSS/BLUE SHIELD | Admitting: Internal Medicine

## 2017-03-14 ENCOUNTER — Other Ambulatory Visit: Payer: Self-pay | Admitting: Internal Medicine

## 2017-09-13 ENCOUNTER — Other Ambulatory Visit: Payer: Self-pay | Admitting: Internal Medicine

## 2017-09-19 ENCOUNTER — Other Ambulatory Visit: Payer: Self-pay

## 2017-09-19 MED ORDER — GLIPIZIDE ER 5 MG PO TB24: 5.0000 mg | ORAL_TABLET | Freq: Every day | ORAL | 3 refills | Status: AC

## 2017-09-19 MED ORDER — METFORMIN HCL 500 MG PO TABS: 500.0000 mg | ORAL_TABLET | Freq: Every day | ORAL | 3 refills | Status: AC

## 2017-09-20 ENCOUNTER — Other Ambulatory Visit: Payer: Self-pay

## 2017-09-20 ENCOUNTER — Telehealth: Payer: Self-pay | Admitting: Pulmonary Disease

## 2017-09-20 MED ORDER — SITAGLIPTIN PHOS-METFORMIN HCL 50-500 MG PO TABS: ORAL_TABLET | ORAL | 0 refills | Status: AC

## 2017-09-20 NOTE — Telephone Encounter (Signed)
Spoke with pt. Advised him that he would need to contact our medical records department for his records. Their number has been provided to him. Also advised him that since we have not seen in close to 3 years, we would not be able to refill his medications. Nothing further was needed.

## 2017-10-07 ENCOUNTER — Other Ambulatory Visit: Payer: Self-pay | Admitting: Internal Medicine

## 2017-10-08 NOTE — Telephone Encounter (Signed)
Is this okay to refill? 

## 2017-10-08 NOTE — Telephone Encounter (Signed)
No, he was lost for f/u. Also, he missed many appts >> will not see him.

## 2017-11-21 ENCOUNTER — Other Ambulatory Visit: Payer: Self-pay | Admitting: Internal Medicine

## 2017-11-21 NOTE — Telephone Encounter (Signed)
Per PCP. He was lost for f/u.

## 2017-11-21 NOTE — Telephone Encounter (Signed)
Is this okay to refill? 

## 2017-11-22 ENCOUNTER — Other Ambulatory Visit: Payer: Self-pay | Admitting: Pulmonary Disease
